# Patient Record
Sex: Female | Born: 1970
Health system: Southern US, Community
[De-identification: ages and names within clinical notes are randomized; demographics above are authoritative.]

## PROBLEM LIST (undated history)

## (undated) DIAGNOSIS — J302 Other seasonal allergic rhinitis: Secondary | ICD-10-CM

## (undated) DIAGNOSIS — I10 Essential (primary) hypertension: Secondary | ICD-10-CM

## (undated) DIAGNOSIS — G43909 Migraine, unspecified, not intractable, without status migrainosus: Secondary | ICD-10-CM

## (undated) DIAGNOSIS — C55 Malignant neoplasm of uterus, part unspecified: Secondary | ICD-10-CM

## (undated) HISTORY — DX: Malignant neoplasm of uterus, part unspecified: C55

## (undated) HISTORY — PX: DILATION AND CURETTAGE OF UTERUS: SHX78

## (undated) HISTORY — PX: NASAL SEPTUM SURGERY: SHX37

## (undated) HISTORY — DX: Essential (primary) hypertension: I10

## (undated) HISTORY — DX: Migraine, unspecified, not intractable, without status migrainosus: G43.909

## (undated) HISTORY — DX: Other seasonal allergic rhinitis: J30.2

---

## 1998-02-01 ENCOUNTER — Ambulatory Visit (HOSPITAL_COMMUNITY): Admission: RE | Admit: 1998-02-01 | Discharge: 1998-02-01 | Payer: Self-pay | Admitting: Gynecology

## 1999-08-08 ENCOUNTER — Encounter: Admission: RE | Admit: 1999-08-08 | Discharge: 1999-11-06 | Payer: Self-pay | Admitting: Gynecology

## 1999-11-13 ENCOUNTER — Other Ambulatory Visit: Admission: RE | Admit: 1999-11-13 | Discharge: 1999-11-13 | Payer: Self-pay | Admitting: Gynecology

## 2000-01-14 ENCOUNTER — Other Ambulatory Visit: Admission: RE | Admit: 2000-01-14 | Discharge: 2000-01-14 | Payer: Self-pay | Admitting: Gynecology

## 2000-11-13 ENCOUNTER — Other Ambulatory Visit: Admission: RE | Admit: 2000-11-13 | Discharge: 2000-11-13 | Payer: Self-pay | Admitting: Gynecology

## 2001-12-14 ENCOUNTER — Other Ambulatory Visit: Admission: RE | Admit: 2001-12-14 | Discharge: 2001-12-14 | Payer: Self-pay

## 2006-02-10 ENCOUNTER — Other Ambulatory Visit: Admission: RE | Admit: 2006-02-10 | Discharge: 2006-02-10 | Payer: Self-pay | Admitting: Obstetrics and Gynecology

## 2006-09-22 ENCOUNTER — Encounter
Admission: RE | Admit: 2006-09-22 | Discharge: 2006-12-21 | Payer: Self-pay | Admitting: Certified Clinical Nurse Specialist

## 2006-12-25 ENCOUNTER — Encounter
Admission: RE | Admit: 2006-12-25 | Discharge: 2007-03-25 | Payer: Self-pay | Admitting: Certified Clinical Nurse Specialist

## 2007-11-24 ENCOUNTER — Ambulatory Visit: Payer: Self-pay | Admitting: Gastroenterology

## 2007-12-07 ENCOUNTER — Ambulatory Visit: Payer: Self-pay | Admitting: Gastroenterology

## 2007-12-25 ENCOUNTER — Ambulatory Visit: Payer: Self-pay | Admitting: Gastroenterology

## 2008-01-08 ENCOUNTER — Ambulatory Visit: Payer: Self-pay | Admitting: Gastroenterology

## 2008-01-21 ENCOUNTER — Ambulatory Visit: Payer: Self-pay | Admitting: Gastroenterology

## 2008-02-18 ENCOUNTER — Ambulatory Visit: Payer: Self-pay | Admitting: Gastroenterology

## 2008-03-17 ENCOUNTER — Ambulatory Visit: Payer: Self-pay | Admitting: Gastroenterology

## 2008-05-04 ENCOUNTER — Ambulatory Visit: Payer: Self-pay | Admitting: Gastroenterology

## 2008-06-29 ENCOUNTER — Ambulatory Visit: Payer: Self-pay | Admitting: Gastroenterology

## 2008-09-15 ENCOUNTER — Ambulatory Visit: Payer: Self-pay | Admitting: Gastroenterology

## 2008-12-15 ENCOUNTER — Ambulatory Visit: Payer: Self-pay | Admitting: Gastroenterology

## 2009-03-15 ENCOUNTER — Ambulatory Visit: Payer: Self-pay | Admitting: Internal Medicine

## 2009-05-16 ENCOUNTER — Ambulatory Visit: Payer: Self-pay | Admitting: Internal Medicine

## 2012-11-16 ENCOUNTER — Ambulatory Visit (INDEPENDENT_AMBULATORY_CARE_PROVIDER_SITE_OTHER): Payer: Commercial Indemnity | Admitting: Physician Assistant

## 2012-11-16 ENCOUNTER — Encounter: Payer: Self-pay | Admitting: Physician Assistant

## 2012-11-16 VITALS — BP 135/87 | HR 95 | Resp 16 | Ht 60.0 in | Wt 179.0 lb

## 2012-11-16 DIAGNOSIS — G43909 Migraine, unspecified, not intractable, without status migrainosus: Secondary | ICD-10-CM | POA: Insufficient documentation

## 2012-11-16 DIAGNOSIS — Z131 Encounter for screening for diabetes mellitus: Secondary | ICD-10-CM

## 2012-11-16 DIAGNOSIS — Z13220 Encounter for screening for lipoid disorders: Secondary | ICD-10-CM

## 2012-11-16 DIAGNOSIS — J302 Other seasonal allergic rhinitis: Secondary | ICD-10-CM | POA: Insufficient documentation

## 2012-11-16 DIAGNOSIS — I10 Essential (primary) hypertension: Secondary | ICD-10-CM

## 2012-11-16 DIAGNOSIS — J45909 Unspecified asthma, uncomplicated: Secondary | ICD-10-CM

## 2012-11-16 LAB — PR PEAK EXPIRATORY FLOW RATE (P

## 2012-11-16 MED ORDER — BUDESONIDE-FORMOTEROL FUMARATE 160-4.5 MCG/ACT IN AERO: 1.0000 | INHALATION_SPRAY | Freq: Two times a day (BID) | RESPIRATORY_TRACT | Status: DC

## 2012-11-16 MED ORDER — ALBUTEROL SULFATE HFA 108 (90 BASE) MCG/ACT IN AERS: 2.0000 | INHALATION_SPRAY | Freq: Four times a day (QID) | RESPIRATORY_TRACT | Status: DC | PRN

## 2012-11-16 MED ORDER — LOSARTAN POTASSIUM-HCTZ 50-12.5 MG PO TABS: 1.0000 | ORAL_TABLET | Freq: Every day | ORAL | Status: DC

## 2012-11-16 NOTE — Patient Instructions (Addendum)
Stop Diovan start Hyzaar. Will call Monday with BP reading and see if any adjustment needs to be made.   1.5 Gram Low Sodium Diet A 1.5 gram sodium diet restricts the amount of sodium in the diet to no more than 1.5 g or 1500 mg daily. The American Heart Association recommends Americans over the age of 59 to consume no more than 1500 mg of sodium each day to reduce the risk of developing high blood pressure. Research also shows that limiting sodium may reduce heart attack and stroke risk. Many foods contain sodium for flavor and sometimes as a preservative. When the amount of sodium in a diet needs to be low, it is important to know what to look for when choosing foods and drinks. The following includes some information and guidelines to help make it easier for you to adapt to a low sodium diet. QUICK TIPS  Do not add salt to food.  Avoid convenience items and fast food.  Choose unsalted snack foods.  Buy lower sodium products, often labeled as "lower sodium" or "no salt added."  Check food labels to learn how much sodium is in 1 serving.  When eating at a restaurant, ask that your food be prepared with less salt or none, if possible. READING FOOD LABELS FOR SODIUM INFORMATION The nutrition facts label is a good place to find how much sodium is in foods. Look for products with no more than 400 mg of sodium per serving. Remember that 1.5 g = 1500 mg. The food label may also list foods as:  Sodium-free: Less than 5 mg in a serving.  Very low sodium: 35 mg or less in a serving.  Low-sodium: 140 mg or less in a serving.  Light in sodium: 50% less sodium in a serving. For example, if a food that usually has 300 mg of sodium is changed to become light in sodium, it will have 150 mg of sodium.  Reduced sodium: 25% less sodium in a serving. For example, if a food that usually has 400 mg of sodium is changed to reduced sodium, it will have 300 mg of sodium. CHOOSING FOODS Grains  Avoid:  Salted crackers and snack items. Some cereals, including instant hot cereals. Bread stuffing and biscuit mixes. Seasoned rice or pasta mixes.  Choose: Unsalted snack items. Low-sodium cereals, oats, puffed wheat and rice, shredded wheat. English muffins and bread. Pasta. Meats  Avoid: Salted, canned, smoked, spiced, pickled meats, including fish and poultry. Bacon, ham, sausage, cold cuts, hot dogs, anchovies.  Choose: Low-sodium canned tuna and salmon. Fresh or frozen meat, poultry, and fish. Dairy  Avoid: Processed cheese and spreads. Cottage cheese. Buttermilk and condensed milk. Regular cheese.  Choose: Milk. Low-sodium cottage cheese. Yogurt. Sour cream. Low-sodium cheese. Fruits and Vegetables  Avoid: Regular canned vegetables. Regular canned tomato sauce and paste. Frozen vegetables in sauces. Olives. Rosita Fire. Relishes. Sauerkraut.  Choose: Low-sodium canned vegetables. Low-sodium tomato sauce and paste. Frozen or fresh vegetables. Fresh and frozen fruit. Condiments  Avoid: Canned and packaged gravies. Worcestershire sauce. Tartar sauce. Barbecue sauce. Soy sauce. Steak sauce. Ketchup. Onion, garlic, and table salt. Meat flavorings and tenderizers.  Choose: Fresh and dried herbs and spices. Low-sodium varieties of mustard and ketchup. Lemon juice. Tabasco sauce. Horseradish. SAMPLE 1.5 GRAM SODIUM MEAL PLAN Breakfast / Sodium (mg)  1 cup low-fat milk / 143 mg  1 whole-wheat English muffin / 240 mg  1 tbs heart-healthy margarine / 153 mg  1 hard-boiled egg / 139 mg  1 small orange / 0 mg Lunch / Sodium (mg)  1 cup raw carrots / 76 mg  2 tbs no salt added peanut butter / 5 mg  2 slices whole-wheat bread / 270 mg  1 tbs jelly / 6 mg   cup red grapes / 2 mg Dinner / Sodium (mg)  1 cup whole-wheat pasta / 2 mg  1 cup low-sodium tomato sauce / 73 mg  3 oz lean ground beef / 57 mg  1 small side salad (1 cup raw spinach leaves,  cup cucumber,  cup yellow bell  pepper) with 1 tsp olive oil and 1 tsp red wine vinegar / 25 mg Snack / Sodium (mg)  1 container low-fat vanilla yogurt / 107 mg  3 graham cracker squares / 127 mg Nutrient Analysis  Calories: 1745  Protein: 75 g  Carbohydrate: 237 g  Fat: 57 g  Sodium: 1425 mg Document Released: 11/18/2005 Document Revised: 02/10/2012 Document Reviewed: 02/19/2010 [facility] Patient Information 2013 [place], [place].

## 2012-11-17 NOTE — Progress Notes (Addendum)
  Subjective:    Patient ID: Brittany Byrd, female    DOB: 1971-07-18, 41 y.o.   MRN: [id number]  HPI Patient presents to the clinic to establish care and to talk about medications. Pt is right now leaving in the [place] for work. She is supposed to move back up here in the summer. She wanted to go ahead and get established and ask some question so she would be ready to go when she moved here.   She has recently had some lung problems that Asthma doctor in [place] do not think is asthma but put her on daily symbicort, benadryl and prn albuterol inhaler. She is doing much better on this therapy. She had allergy testing and tested positive for a mold where she lives and work. Aware this is an ongoing problem. Pt is also on allergy shots. She does not smoke.  She also was put on Divovan. It was very expensive and the first medication she tried. Wants to try something cheaper for BP control. She has had elevated BP for about 1 year. BP has been as high as 160's of 90's. Denies any current CP, palpitations, HAs. Pt is trying to eat balanced and exercise regularly. She has been really busy and not been able to exercise as much lately.   Needs screening labs. Scheduled for pap/mammogram this week.      Review of Systems     Objective:   Physical Exam  Constitutional: She is oriented to person, place, and time. She appears well-developed and well-nourished.  HENT:  Head: Normocephalic and atraumatic.  Cardiovascular: Normal rate and regular rhythm.   Murmur heard.      2/6 systolic ejection murmur.  Pulmonary/Chest: Effort normal and breath sounds normal. She has no wheezes.  Neurological: She is alert and oriented to person, place, and time.  Skin: Skin is warm and dry.  Psychiatric: She has a normal mood and affect. Her behavior is normal.          Assessment & Plan:  Asthma, allergic- peak flow today 420 and in green zone. Reassured patient that treatment is working. Continue on inhalers,  allergy shots, and benadryl. Could try to replace benadryl with Zyrtec/claritin.  HTN- Stop Diovan.  start Hyzaar. Will call Monday with BP reading and see if any adjustment needs to be made. If not will send rx for 3 month supply. After that will have to get in [place]. See ya when you get back in 6 months. Reminded of low salt diet and importance of exercise.  Gave lab slip for screening labs. Declined flu shot does not think will be as effective as one she could get in [place].

## 2012-11-18 DIAGNOSIS — J45909 Unspecified asthma, uncomplicated: Secondary | ICD-10-CM | POA: Insufficient documentation

## 2012-11-20 ENCOUNTER — Ambulatory Visit: Payer: Self-pay | Admitting: Physician Assistant

## 2012-11-20 LAB — COMPREHENSIVE METABOLIC PANEL
ALT: <8 U/L (ref 0–35)
AST: 12 U/L (ref 0–37)
Alkaline Phosphatase: 47 U/L (ref 39–117)
Calcium: 9.3 mg/dL (ref 8.4–10.5)
Chloride: 103 mEq/L (ref 96–112)
Creat: 0.79 mg/dL (ref 0.50–1.10)
Potassium: 4 mEq/L (ref 3.5–5.3)

## 2012-11-20 LAB — LIPID PANEL
LDL Cholesterol: 119 mg/dL — ABNORMAL HIGH (ref 0–99)
Total CHOL/HDL Ratio: 4.1 Ratio

## 2012-11-26 ENCOUNTER — Telehealth: Payer: Self-pay | Admitting: *Deleted

## 2012-11-26 NOTE — Telephone Encounter (Signed)
Pt called stating that her BP readings were 134/90 and 128/85 and she is asking for a 90 day supply on the BP med since she is going out of the country. Please advise.

## 2012-11-27 MED ORDER — LOSARTAN POTASSIUM-HCTZ 50-12.5 MG PO TABS: 1.0000 | ORAL_TABLET | Freq: Every day | ORAL | Status: DC

## 2012-11-27 NOTE — Telephone Encounter (Signed)
Please let pt know medication was sent to pharmacy.

## 2012-11-27 NOTE — Telephone Encounter (Signed)
Sent!

## 2013-02-02 ENCOUNTER — Other Ambulatory Visit: Payer: Self-pay | Admitting: Physician Assistant

## 2013-03-31 ENCOUNTER — Encounter: Payer: Self-pay | Admitting: Physician Assistant

## 2013-03-31 ENCOUNTER — Ambulatory Visit (INDEPENDENT_AMBULATORY_CARE_PROVIDER_SITE_OTHER): Payer: Commercial Indemnity | Admitting: Physician Assistant

## 2013-03-31 VITALS — BP 128/83 | HR 85 | Wt 178.0 lb

## 2013-03-31 DIAGNOSIS — I10 Essential (primary) hypertension: Secondary | ICD-10-CM

## 2013-03-31 DIAGNOSIS — E669 Obesity, unspecified: Secondary | ICD-10-CM

## 2013-03-31 MED ORDER — HYDROCHLOROTHIAZIDE 25 MG PO TABS: 25.0000 mg | ORAL_TABLET | Freq: Every day | ORAL | Status: DC

## 2013-03-31 MED ORDER — LOSARTAN POTASSIUM-HCTZ 50-12.5 MG PO TABS: 1.0000 | ORAL_TABLET | Freq: Every day | ORAL | Status: DC

## 2013-03-31 NOTE — Progress Notes (Signed)
  Subjective:    Patient ID: Brittany Byrd, female    DOB: November 08, 1971, 42 y.o.   MRN: [id number]  HPI Patient presents to the clinic to follow up on HTN.   HTN is takes hyzaar daily with no complications. She denies any CP, palpitations, SOB, worsening headaches. She occasionally gets migraines that she take treximet and phenergan for. She is exercising 4-5 times a week and has a Systems analyst. She has a Systems analyst. Calories around 1400-1600 daily. She has lost 35 lbs over the course of last 2 years. She feels really good and has lots of energy. She would like to decrease BP medication. She states when she checks at home running low 100's. She denies any dizziness. She continues to live in [place], out of the country. She comes back July 4th [place] for [place].    Review of Systems     Objective:   Physical Exam  Constitutional: She is oriented to person, place, and time. She appears well-developed and well-nourished.  HENT:  Head: Normocephalic and atraumatic.  Cardiovascular: Normal rate, regular rhythm and normal heart sounds.   Pulmonary/Chest: Effort normal and breath sounds normal.  Neurological: She is alert and oriented to person, place, and time.  Skin: Skin is warm and dry.  Psychiatric: She has a normal mood and affect. Her behavior is normal.          Assessment & Plan:  HTN- consider switching to diuretic only. Gave rx and then we change our mind. Prescription was voided. Refilled hyzaar patient instructed if she would like to see if BP is still controlled with decrease to half tab. Continue to monitor BP. If staying in 120's awesome if starting to increase and around 140's then need to go back to 1 full tab. Follow up when come back to states. 2-3 months.   Obesity- Discussion of things to do for more weight loss. Pt aware BMI is 34. Goal under 25. Discussed portion sizes and to switch it up with exercise. Advised to decrease daily calories by 200 and  see if that helps to get daily calories around 1200. Can consider phentermine or belviq when [place] and can be monitored.   Spent 30 minutes with patient and greater than 50 percent of encounter spent counseling regarding calories and weight loss.

## 2013-07-23 ENCOUNTER — Encounter: Payer: Self-pay | Admitting: Physician Assistant

## 2013-08-18 ENCOUNTER — Ambulatory Visit: Payer: Self-pay

## 2013-09-09 ENCOUNTER — Other Ambulatory Visit: Payer: Self-pay | Admitting: *Deleted

## 2013-09-09 MED ORDER — LOSARTAN POTASSIUM-HCTZ 50-12.5 MG PO TABS: 1.0000 | ORAL_TABLET | Freq: Every day | ORAL | Status: DC

## 2013-10-15 ENCOUNTER — Ambulatory Visit (INDEPENDENT_AMBULATORY_CARE_PROVIDER_SITE_OTHER): Payer: 59 | Admitting: Physician Assistant

## 2013-10-15 ENCOUNTER — Encounter: Payer: Self-pay | Admitting: Physician Assistant

## 2013-10-15 VITALS — BP 116/75 | HR 87 | Wt 194.0 lb

## 2013-10-15 DIAGNOSIS — R635 Abnormal weight gain: Secondary | ICD-10-CM

## 2013-10-15 DIAGNOSIS — G4700 Insomnia, unspecified: Secondary | ICD-10-CM

## 2013-10-15 DIAGNOSIS — I10 Essential (primary) hypertension: Secondary | ICD-10-CM

## 2013-10-15 MED ORDER — LOSARTAN POTASSIUM-HCTZ 50-12.5 MG PO TABS: 1.0000 | ORAL_TABLET | Freq: Every day | ORAL | Status: DC

## 2013-10-15 NOTE — Patient Instructions (Addendum)
Melatonin 3mg  up to 10mg  at night. Valerian root 300mg .   Calorie Counting Diet A calorie counting diet requires you to eat the number of calories that are right for you in a day. Calories are the measurement of how much energy you get from the food you eat. Eating the right amount of calories is important for staying at a healthy weight. If you eat too many calories, your body will store them as fat and you may gain weight. If you eat too few calories, you may lose weight. Counting the number of calories you eat during a day will help you know if you are eating the right amount. A Registered Dietitian can determine how many calories you need in a day. The amount of calories needed varies from person to person. If your goal is to lose weight, you will need to eat fewer calories. Losing weight can benefit you if you are overweight or have health problems such as heart disease, high blood pressure, or diabetes. If your goal is to gain weight, you will need to eat more calories. Gaining weight may be necessary if you have a certain health problem that causes your body to need more energy. TIPS Whether you are increasing or decreasing the number of calories you eat during a day, it may be hard to get used to changes in what you eat and drink. The following are tips to help you keep track of the number of calories you eat.  Measure foods at home with measuring cups. This helps you know the amount of food and number of calories you are eating.  Restaurants often serve food in amounts that are larger than 1 serving. While eating out, estimate how many servings of a food you are given. For example, a serving of cooked rice is  cup or about the size of half of a fist. Knowing serving sizes will help you be aware of how much food you are eating at restaurants.  Ask for smaller portion sizes or child-size portions at restaurants.  Plan to eat half of a meal at a restaurant. Take the rest home or share the other  half with a friend.  Read the Nutrition Facts panel on food labels for calorie content and serving size. You can find out how many servings are in a package, the size of a serving, and the number of calories each serving has.  For example, a package might contain 3 cookies. The Nutrition Facts panel on that package says that 1 serving is 1 cookie. Below that, it will say there are 3 servings in the container. The calories section of the Nutrition Facts label says there are 90 calories. This means there are 90 calories in 1 cookie (1 serving). If you eat 1 cookie you have eaten 90 calories. If you eat all 3 cookies, you have eaten 270 calories (3 servings x 90 calories = 270 calories). The list below tells you how big or small some common portion sizes are.  1 oz.........4 stacked dice.  3 oz........[name] KitchenDeck of cards.  1 tsp.......[name] KitchenTip of little finger.  1 tbs......[name] KitchenMarland KitchenThumb.  2 tbs.......[name] KitchenGolf ball.   cup......[name] KitchenHalf of a fist.  1 cup.......[name] KitchenA fist. KEEP A FOOD LOG Write down every food item you eat, the amount you eat, and the number of calories in each food you eat during the day. At the end of the day, you can add up the total number of calories you have eaten. It may help to keep a list  like the one below. Find out the calorie information by reading the Nutrition Facts panel on food labels. Breakfast  Bran cereal (1 cup, 110 calories).  Fat-free milk ( cup, 45 calories). Snack  Apple (1 medium, 80 calories). Lunch  Spinach (1 cup, 20 calories).  Tomato ( medium, 20 calories).  Chicken breast strips (3 oz, 165 calories).  Shredded cheddar cheese ( cup, 110 calories).  Light [place] dressing (2 tbs, 60 calories).  Whole-wheat bread (1 slice, 80 calories).  Tub margarine (1 tsp, 35 calories).  Vegetable soup (1 cup, 160 calories). Dinner  Pork chop (3 oz, 190 calories).  Brown rice (1 cup, 215 calories).  Steamed broccoli ( cup, 20 calories).  Strawberries (1   cup, 65 calories).  Whipped cream (1 tbs, 50 calories). Daily Calorie Total: 1425 Document Released: 11/18/2005 Document Revised: 02/10/2012 Document Reviewed: 05/15/2007 [facility] Patient Information 2014 [place], [place].   Insomnia Insomnia is frequent trouble falling and/or staying asleep. Insomnia can be a long term problem or a short term problem. Both are common. Insomnia can be a short term problem when the wakefulness is related to a certain stress or worry. Long term insomnia is often related to ongoing stress during waking hours and/or poor sleeping habits. Overtime, sleep deprivation itself can make the problem worse. Every little thing feels more severe because you are overtired and your ability to cope is decreased. CAUSES   Stress, anxiety, and depression.  Poor sleeping habits.  Distractions such as TV in the bedroom.  Naps close to bedtime.  Engaging in emotionally charged conversations before bed.  Technical reading before sleep.  Alcohol and other sedatives. They may make the problem worse. They can hurt normal sleep patterns and normal dream activity.  Stimulants such as caffeine for several hours prior to bedtime.  Pain syndromes and shortness of breath can cause insomnia.  Exercise late at night.  Changing time zones may cause sleeping problems (jet lag). It is sometimes helpful to have someone observe your sleeping patterns. They should look for periods of not breathing during the night (sleep apnea). They should also look to see how long those periods last. If you live alone or observers are uncertain, you can also be observed at a sleep clinic where your sleep patterns will be professionally monitored. Sleep apnea requires a checkup and treatment. Give your caregivers your medical history. Give your caregivers observations your family has made about your sleep.  SYMPTOMS   Not feeling rested in the morning.  Anxiety and restlessness at  bedtime.  Difficulty falling and staying asleep. TREATMENT   Your caregiver may prescribe treatment for an underlying medical disorders. Your caregiver can give advice or help if you are using alcohol or other drugs for self-medication. Treatment of underlying problems will usually eliminate insomnia problems.  Medications can be prescribed for short time use. They are generally not recommended for lengthy use.  Over-the-counter sleep medicines are not recommended for lengthy use. They can be habit forming.  You can promote easier sleeping by making lifestyle changes such as:  Using relaxation techniques that help with breathing and reduce muscle tension.  Exercising earlier in the day.  Changing your diet and the time of your last meal. No night time snacks.  Establish a regular time to go to bed.  Counseling can help with stressful problems and worry.  Soothing music and white noise may be helpful if there are background noises you cannot remove.  Stop tedious detailed work at least one  hour before bedtime. HOME CARE INSTRUCTIONS   Keep a diary. Inform your caregiver about your progress. This includes any medication side effects. See your caregiver regularly. Take note of:  Times when you are asleep.  Times when you are awake during the night.  The quality of your sleep.  How you feel the next day. This information will help your caregiver care for you.  Get out of bed if you are still awake after 15 minutes. Read or do some quiet activity. Keep the lights down. Wait until you feel sleepy and go back to bed.  Keep regular sleeping and waking hours. Avoid naps.  Exercise regularly.  Avoid distractions at bedtime. Distractions include watching television or engaging in any intense or detailed activity like attempting to balance the household checkbook.  Develop a bedtime ritual. Keep a familiar routine of bathing, brushing your teeth, climbing into bed at the same time  each night, listening to soothing music. Routines increase the success of falling to sleep faster.  Use relaxation techniques. This can be using breathing and muscle tension release routines. It can also include visualizing peaceful scenes. You can also help control troubling or intruding thoughts by keeping your mind occupied with boring or repetitive thoughts like the old concept of counting sheep. You can make it more creative like imagining planting one beautiful flower after another in your backyard garden.  During your day, work to eliminate stress. When this is not possible use some of the previous suggestions to help reduce the anxiety that accompanies stressful situations. MAKE SURE YOU:   Understand these instructions.  Will watch your condition.  Will get help right away if you are not doing well or get worse. Document Released: 11/15/2000 Document Revised: 02/10/2012 Document Reviewed: 12/16/2007 [facility] Patient Information 2014 [place], [place].

## 2013-10-17 NOTE — Progress Notes (Signed)
  Subjective:    Patient ID: Brittany Byrd, female    DOB: 05-31-1971, 42 y.o.   MRN: [id number]  HPI Patient presents to the clinic for a medication refill for her HTN medication. Pt has no problems or concerns today. Pt denies any CP, palpitations, vision changes or headaches. Pt takes medication regularly without any side effects.    Review of Systems     Objective:   Physical Exam  Constitutional: She is oriented to person, place, and time. She appears well-developed and well-nourished.  HENT:  Head: Normocephalic and atraumatic.  Cardiovascular: Normal rate, regular rhythm and normal heart sounds.   Pulmonary/Chest: Effort normal and breath sounds normal.  Neurological: She is alert and oriented to person, place, and time.  Skin: Skin is warm and dry.  Psychiatric: She has a normal mood and affect. Her behavior is normal.          Assessment & Plan:  HTN- last CMP was 12/13 and was normal. Follow up in 6 months and will recheck then. Discussed with patient a CPE in near future. Refilled hyzaar for 6 months.

## 2013-11-22 ENCOUNTER — Other Ambulatory Visit: Payer: Self-pay | Admitting: *Deleted

## 2013-11-22 MED ORDER — LOSARTAN POTASSIUM-HCTZ 50-12.5 MG PO TABS: 1.0000 | ORAL_TABLET | Freq: Every day | ORAL | Status: DC

## 2013-12-24 ENCOUNTER — Encounter: Payer: Self-pay | Admitting: Physician Assistant

## 2013-12-24 ENCOUNTER — Ambulatory Visit (INDEPENDENT_AMBULATORY_CARE_PROVIDER_SITE_OTHER): Payer: 59 | Admitting: Physician Assistant

## 2013-12-24 VITALS — BP 114/62 | HR 87 | Temp 97.7°F | Wt 195.0 lb

## 2013-12-24 DIAGNOSIS — J329 Chronic sinusitis, unspecified: Secondary | ICD-10-CM

## 2013-12-24 DIAGNOSIS — R05 Cough: Secondary | ICD-10-CM

## 2013-12-24 DIAGNOSIS — A499 Bacterial infection, unspecified: Secondary | ICD-10-CM

## 2013-12-24 DIAGNOSIS — R059 Cough, unspecified: Secondary | ICD-10-CM

## 2013-12-24 DIAGNOSIS — B9689 Other specified bacterial agents as the cause of diseases classified elsewhere: Secondary | ICD-10-CM

## 2013-12-24 MED ORDER — HYDROCODONE-HOMATROPINE 5-1.5 MG/5ML PO SYRP: 5.0000 mL | ORAL_SOLUTION | Freq: Every evening | ORAL | Status: DC | PRN

## 2013-12-24 MED ORDER — AMOXICILLIN-POT CLAVULANATE 875-125 MG PO TABS: 1.0000 | ORAL_TABLET | Freq: Two times a day (BID) | ORAL | Status: DC

## 2013-12-24 MED ORDER — BENZONATATE 200 MG PO CAPS: 200.0000 mg | ORAL_CAPSULE | Freq: Three times a day (TID) | ORAL | Status: DC | PRN

## 2013-12-24 NOTE — Patient Instructions (Signed)

## 2013-12-24 NOTE — Progress Notes (Signed)
   Subjective:    Patient ID: Brittany Byrd, female    DOB: 1971-10-21, 43 y.o.   MRN: [id number]  HPI patient presents to the clinic with chief complaint of cough and sinus pressure. Patient has experienced symptoms for the last 5 days. Last night and this morning she woke up with a low-grade fever of 99-100. Her cough is dry. She continues to cough throughout the day. She's had multiple episodes where she almost felt short of breath from coughing so much. She has significant pain under bilateral arms and around chest from coughing. She has tried Advil, Mucinex, NyQuil and Vicks vapor rub. Yesterday she started having significant facial pain and headache. She has a mild sore throat still able to eat and drink. She denies any ear pain. She does have some bony aches. No wheezing or SOb.     Review of Systems     Objective:   Physical Exam  Constitutional: She is oriented to person, place, and time. She appears well-developed and well-nourished.  HENT:  Head: Normocephalic and atraumatic.  Right Ear: External ear normal.  Left Ear: External ear normal.  TMs clear bilaterally.  Oropharynx erythematous with postnasal drip present.  Bilateral nasal turbinates red and swollen.  Eyes: Conjunctivae are normal.  Neck: Normal range of motion. Neck supple.  Cardiovascular: Normal rate, regular rhythm and normal heart sounds.   Pulmonary/Chest: Effort normal and breath sounds normal. She has no wheezes.  Lymphadenopathy:    She has no cervical adenopathy.  Neurological: She is alert and oriented to person, place, and time.  Skin: Skin is warm and dry.  Psychiatric: She has a normal mood and affect. Her behavior is normal.          Assessment & Plan:   Bacterial sinusitis/cough-Augmentin given for 10 days. Tessalon pearles for cough during day. Hycodan for cough at bedtime. Rest, hydrated. Call if not improving.

## 2014-01-17 ENCOUNTER — Encounter: Payer: Self-pay | Admitting: Physician Assistant

## 2014-01-17 ENCOUNTER — Ambulatory Visit (INDEPENDENT_AMBULATORY_CARE_PROVIDER_SITE_OTHER): Payer: 59 | Admitting: Physician Assistant

## 2014-01-17 VITALS — BP 116/66 | HR 100 | Temp 100.5°F | Wt 191.0 lb

## 2014-01-17 DIAGNOSIS — R509 Fever, unspecified: Secondary | ICD-10-CM

## 2014-01-17 DIAGNOSIS — R6883 Chills (without fever): Secondary | ICD-10-CM

## 2014-01-17 DIAGNOSIS — R51 Headache: Secondary | ICD-10-CM

## 2014-01-17 DIAGNOSIS — R110 Nausea: Secondary | ICD-10-CM

## 2014-01-17 DIAGNOSIS — J029 Acute pharyngitis, unspecified: Secondary | ICD-10-CM

## 2014-01-17 DIAGNOSIS — J019 Acute sinusitis, unspecified: Secondary | ICD-10-CM

## 2014-01-17 LAB — POCT INFLUENZA A/B
INFLUENZA B, POC: NEGATIVE
Influenza A, POC: NEGATIVE

## 2014-01-17 MED ORDER — METHYLPREDNISOLONE (PAK) 4 MG PO TABS
ORAL_TABLET | ORAL | Status: DC
Start: 2014-01-17 — End: 2014-01-28

## 2014-01-17 MED ORDER — HYDROCODONE-HOMATROPINE 5-1.5 MG/5ML PO SYRP: 5.0000 mL | ORAL_SOLUTION | Freq: Every evening | ORAL | Status: DC | PRN

## 2014-01-17 MED ORDER — AMOXICILLIN 500 MG PO CAPS: 500.0000 mg | ORAL_CAPSULE | Freq: Three times a day (TID) | ORAL | Status: DC

## 2014-01-17 NOTE — Progress Notes (Signed)
   Subjective:    Patient ID: Brittany Byrd, female    DOB: 1971/07/01, 43 y.o.   MRN: [id number]  HPI Pt is a 43 yo female with 4 days of symptoms. Started with dry cough, headache and congestion. Symptoms continue to get worse. She has had a lot of body aches. She denies any wheezing or SOB. She has had a fever on and off. No sick contacts. No nausea and vomiting. Extreme head pressure. Facial pain bilaterally. Using left cough syrup given by [place], mucinex d and zyrtec with no relief.   Review of Systems     Objective:   Physical Exam  Constitutional: She is oriented to person, place, and time. She appears well-developed and well-nourished.  HENT:  Head: Normocephalic and atraumatic.  Right Ear: External ear normal.  Left Ear: External ear normal.  Nose: Nose normal.  Mouth/Throat: Oropharynx is clear and moist.  TM's clear bilaterally.   Left and right maxillary facial pain to palpation.   Eyes: Conjunctivae are normal. Right eye exhibits no discharge. Left eye exhibits no discharge.  Neck: Normal range of motion. Neck supple.  Cardiovascular: Normal rate, regular rhythm and normal heart sounds.   Pulmonary/Chest: Effort normal and breath sounds normal. She has no wheezes.  Lymphadenopathy:    She has no cervical adenopathy.  Neurological: She is alert and oriented to person, place, and time.  Skin: Skin is warm and dry.  Flushed cheeks.   Psychiatric: She has a normal mood and affect. Her behavior is normal.          Assessment & Plan:  Sinusitis/flu like symptoms- influenza was negative. However I am not convinced that she might have waited to long to get tested and now negative. She certainly has symptoms suggestive of sinusitis for last 4 days. I encouraged pt to start medrol dose pack with other symptomatic treatment. If not improving in 24-48 hours gave amoxil to start. HO given for sinusitis. Work note for today and tomorrow. Continue ibuprofen for fever and pains.

## 2014-01-17 NOTE — Patient Instructions (Signed)

## 2014-01-20 ENCOUNTER — Encounter: Payer: Self-pay | Admitting: Emergency Medicine

## 2014-01-20 ENCOUNTER — Emergency Department (INDEPENDENT_AMBULATORY_CARE_PROVIDER_SITE_OTHER)
Admission: EM | Admit: 2014-01-20 | Discharge: 2014-01-20 | Disposition: A | Discharge status: Home / Self Care | Payer: 59 | Source: Home / Self Care | Attending: Emergency Medicine | Admitting: Emergency Medicine

## 2014-01-20 ENCOUNTER — Emergency Department (INDEPENDENT_AMBULATORY_CARE_PROVIDER_SITE_OTHER): Payer: 59

## 2014-01-20 DIAGNOSIS — R05 Cough: Secondary | ICD-10-CM

## 2014-01-20 DIAGNOSIS — R059 Cough, unspecified: Secondary | ICD-10-CM

## 2014-01-20 DIAGNOSIS — R509 Fever, unspecified: Secondary | ICD-10-CM

## 2014-01-20 DIAGNOSIS — J45909 Unspecified asthma, uncomplicated: Secondary | ICD-10-CM

## 2014-01-20 DIAGNOSIS — R062 Wheezing: Secondary | ICD-10-CM

## 2014-01-20 DIAGNOSIS — J209 Acute bronchitis, unspecified: Secondary | ICD-10-CM

## 2014-01-20 MED ORDER — IPRATROPIUM-ALBUTEROL 0.5-2.5 (3) MG/3ML IN SOLN
3.0000 mL | Freq: Once | RESPIRATORY_TRACT | Status: AC
  Administered 2014-01-20: 3 mL via RESPIRATORY_TRACT

## 2014-01-20 MED ORDER — AZITHROMYCIN 250 MG PO TABS: ORAL_TABLET | ORAL | Status: DC

## 2014-01-20 MED ORDER — FLUCONAZOLE 150 MG PO TABS: 150.0000 mg | ORAL_TABLET | Freq: Once | ORAL | Status: DC

## 2014-01-20 MED ORDER — METHYLPREDNISOLONE SODIUM SUCC 125 MG IJ SOLR
125.0000 mg | INTRAMUSCULAR | Status: AC
  Administered 2014-01-20: 125 mg via INTRAMUSCULAR

## 2014-01-20 MED ORDER — CEFTRIAXONE SODIUM 1 G IJ SOLR
1.0000 g | INTRAMUSCULAR | Status: AC
  Administered 2014-01-20: 1 g via INTRAMUSCULAR

## 2014-01-20 NOTE — ED Notes (Signed)
Cough, chills, fever, wheezing. Was seen on Monday, put on prednisone, abx, cough syrup, mucinex but feels worse

## 2014-01-20 NOTE — ED Provider Notes (Signed)
CSN: [id number]     Arrival date & time 01/20/14  7829 History   First MD Initiated Contact with Patient 01/20/14 1005     Chief Complaint  Patient presents with  . Cough     (Consider location/radiation/quality/duration/timing/severity/associated sxs/prior Treatment) HPI URI HISTORY  Brittany Byrd is a 43 y.o. female who complains of onset of cough and cold symptoms for over one week.  Cough, chills, fever, wheezing. Was seen by PCP 3 days ago, put on Medrol Dosepak, amoxicillin, cough syrup, mucinex, albuterol HFA, but feels worse  History of wheezing with URIs in the past.   positive chills/sweats +  Fever  +  Nasal congestion +  Discolored Post-nasal drainage No sinus pain/pressure Minimal sore throat  +  Hacking cough, mostly dry, occasionally productive of discolored sputum. + wheezing + chest congestion No hemoptysis No shortness of breath No pleuritic pain, but has anterior chest irritation and soreness when she coughs.--No exertional chest pain.  No itchy/red eyes No earache  No nausea No vomiting No abdominal pain No diarrhea  No skin rashes +  Fatigue No myalgias No headache   Past Medical History  Diagnosis Date  . Hypertension   . Seasonal allergies   . Migraine    Past Surgical History  Procedure Laterality Date  . Nasal septum surgery    . Dilation and curettage of uterus     Family History  Problem Relation Age of Onset  . Depression Mother   . Hypertension Mother   . Diabetes Father   . Hypertension Father   . Diabetes Maternal Grandmother   . Diabetes Paternal Grandmother    History  Substance Use Topics  . Smoking status: Never Smoker   . Smokeless tobacco: Never Used  . Alcohol Use: Yes     Comment: rare   OB History   Grav Para Term Preterm Abortions TAB SAB Ect Mult Living                 Review of Systems  All other systems reviewed and are negative.      Allergies  Review of patient's allergies indicates not on  file.  Home Medications   Current Outpatient Rx  Name  Route  Sig  Dispense  Refill  . albuterol (PROVENTIL HFA;VENTOLIN HFA) 108 (90 BASE) MCG/ACT inhaler   Inhalation   Inhale 2 puffs into the lungs every 6 (six) hours as needed.   4 Inhaler   0   . amoxicillin (AMOXIL) 500 MG capsule   Oral   Take 1 capsule (500 mg total) by mouth 3 (three) times daily.   30 capsule   0   . aspirin 81 MG tablet   Oral   Take 81 mg by mouth daily.         [name] azithromycin (ZITHROMAX Z-PAK) 250 MG tablet      Take 2 tablets on day one, then 1 tablet daily on days 2 through 5   1 each   0   . diphenhydrAMINE (BENADRYL) 25 MG tablet   Oral   Take 25 mg by mouth every 6 (six) hours as needed.         . drospirenone-ethinyl estradiol (YAZ) 3-0.02 MG tablet   Oral   Take 1 tablet by mouth daily.         . fluconazole (DIFLUCAN) 150 MG tablet   Oral   Take 1 tablet (150 mg total) by mouth once. Take 1 now, then may repeat x1 in  4 days, for yeast infection.   2 tablet   0   . HYDROcodone-homatropine (HYCODAN) 5-1.5 MG/5ML syrup   Oral   Take 5 mLs by mouth at bedtime as needed for cough.   120 mL   0   . losartan-hydrochlorothiazide (HYZAAR) 50-12.5 MG per tablet   Oral   Take 1 tablet by mouth daily.   90 tablet   1   . methylPREDNIsolone (MEDROL DOSPACK) 4 MG tablet      follow package directions   21 tablet   0   . SUMAtriptan-naproxen (TREXIMET) 85-500 MG per tablet   Oral   Take 1 tablet by mouth every 2 (two) hours as needed.          BP 110/78  Pulse 92  Temp(Src) 98.3 F (36.8 C) (Oral)  Ht 5' (1.524 m)  Wt 191 lb (86.637 kg)  BMI 37.30 kg/m2  SpO2 97% Physical Exam  Nursing note and vitals reviewed. Constitutional: She is oriented to person, place, and time. She appears well-developed and well-nourished. No distress.  HENT:  Head: Normocephalic and atraumatic.  Right Ear: Tympanic membrane normal.  Left Ear: Tympanic membrane normal.  Nose:  Mucosal edema and rhinorrhea present. Right sinus exhibits no maxillary sinus tenderness. Left sinus exhibits no maxillary sinus tenderness.  Mouth/Throat: Oropharynx is clear and moist. No oropharyngeal exudate.  Eyes: Right eye exhibits no discharge. Left eye exhibits no discharge. No scleral icterus.  Neck: Neck supple.  Cardiovascular: Normal rate, regular rhythm and normal heart sounds.   Pulmonary/Chest: No respiratory distress. She has decreased breath sounds (Mild, diffuse decreased breath sounds, fair, equal air movement bilaterally). She has wheezes. She has rhonchi. She has no rales.  Lymphadenopathy:    She has no cervical adenopathy.  Neurological: She is alert and oriented to person, place, and time.  Skin: Skin is warm and dry. No rash noted.    ED Course  Procedures (including critical care time) Labs Review Labs Reviewed - No data to display Imaging Review Dg Chest 2 View  01/20/2014   CLINICAL DATA:  Cough and fever  EXAM: CHEST  2 VIEW  COMPARISON:  None.  FINDINGS: Lungs are clear. Heart size and pulmonary vascularity are normal. No adenopathy. No bone lesions.  IMPRESSION: No edema or consolidation.   Electronically Signed   By: Lowella Grip M.D.   On: 01/20/2014 10:29    Chest x-ray and DuoNeb treatment ordered   MDM   Final diagnoses:  Bronchitis, acute, with bronchospasm   after DuoNeb treatment, wheezing and aeration improved. Chest x-ray shows no infiltrates or other abnormalities.--Reviewed with patient Treatment options discussed, as well as risks, benefits, alternatives. Patient voiced understanding and agreement with the following plans: Rocephin 1 g IM stat Depo-Medrol 125 mg IM stat I advised and offered a new prescription for 10-12 days of prednisone, but she declined and she prefers to finish the Medrol Dosepak that she has. Zithromax Z-PAK Prescribe Diflucan at her request in case she gets yeast infection from the antibiotic Follow-up with  your primary care doctor in 5-7 days if not improving, or sooner if symptoms become worse.  Precautions discussed. Red flags discussed. Questions invited and answered. Patient voiced understanding and agreement.     Jacqulyn Cane, MD 01/20/14 2207

## 2014-01-28 ENCOUNTER — Ambulatory Visit (INDEPENDENT_AMBULATORY_CARE_PROVIDER_SITE_OTHER): Payer: 59 | Admitting: Physician Assistant

## 2014-01-28 ENCOUNTER — Encounter: Payer: Self-pay | Admitting: Physician Assistant

## 2014-01-28 VITALS — BP 115/68 | HR 95 | Temp 98.1°F | Wt 187.0 lb

## 2014-01-28 DIAGNOSIS — R05 Cough: Secondary | ICD-10-CM

## 2014-01-28 DIAGNOSIS — J45909 Unspecified asthma, uncomplicated: Secondary | ICD-10-CM

## 2014-01-28 DIAGNOSIS — R059 Cough, unspecified: Secondary | ICD-10-CM

## 2014-01-28 MED ORDER — HYDROCOD POLST-CHLORPHEN POLST 10-8 MG/5ML PO LQCR: 5.0000 mL | Freq: Two times a day (BID) | ORAL | Status: DC | PRN

## 2014-01-28 MED ORDER — METHYLPREDNISOLONE ACETATE 80 MG/ML IJ SUSP
80.0000 mg | Freq: Once | INTRAMUSCULAR | Status: AC
  Administered 2014-01-28: 80 mg via INTRAMUSCULAR

## 2014-01-28 MED ORDER — BENZONATATE 200 MG PO CAPS: 200.0000 mg | ORAL_CAPSULE | Freq: Two times a day (BID) | ORAL | Status: DC | PRN

## 2014-01-28 NOTE — Patient Instructions (Addendum)
Start Qvar 1 puff twice a day.  Cough syrup for at night and during the day.  Continue to use albuterol as needed.  Zyrtec with singulair.

## 2014-01-30 NOTE — Progress Notes (Signed)
   Subjective:    Patient ID: Brittany Byrd, female    DOB: 28-Aug-1971, 43 y.o.   MRN: [id number]  HPI Pt presents to the clinic to follow up on bronchitis. She was seen by me on 2/16 and then UC on 2/19. She has completed zpak, oral steroids, solumedrol and hycodan. CXR normal. She is feeling better but still has nagging cough. She is supposed to go on vacation in one week and wants to get better. Cough is not productive. She does feel like she still wheezes every so often. Denies any ear pain, fever, ST, sinus pressure. All in chest right now. Cough is still productive.    Review of Systems     Objective:   Physical Exam  Constitutional: She is oriented to person, place, and time. She appears well-developed and well-nourished.  HENT:  Head: Normocephalic and atraumatic.  Right Ear: External ear normal.  Left Ear: External ear normal.  Eyes: Conjunctivae are normal. Right eye exhibits no discharge. Left eye exhibits no discharge.  Neck: Normal range of motion. Neck supple.  Cardiovascular: Normal rate, regular rhythm and normal heart sounds.   Pulmonary/Chest: Effort normal and breath sounds normal.  Lymphadenopathy:    She has no cervical adenopathy.  Neurological: She is alert and oriented to person, place, and time.  Psychiatric: She has a normal mood and affect. Her behavior is normal.          Assessment & Plan:  asthmatic bronchitis- another shot of depo medrol with longer onset given today of 80mg . I do not see any signs of infection. I do not want to give an abx. Did give stronger cough syrup with tussionex at bedtime. Continue to use albuterol as needed for SOB and wheezing. Concerned her asthma might be in a more chronic flare. Started samples Qvar 1 puff BID of 73mcg daily and added zyrtec daily to regimen. Follow up in 1 month.  Call if not improving.

## 2014-05-11 ENCOUNTER — Other Ambulatory Visit: Payer: Self-pay | Admitting: Physician Assistant

## 2014-07-26 ENCOUNTER — Encounter: Payer: Self-pay | Admitting: Physician Assistant

## 2014-07-26 ENCOUNTER — Ambulatory Visit (INDEPENDENT_AMBULATORY_CARE_PROVIDER_SITE_OTHER): Payer: 59 | Admitting: Physician Assistant

## 2014-07-26 VITALS — BP 129/79 | HR 92 | Ht 60.0 in | Wt 193.0 lb

## 2014-07-26 DIAGNOSIS — R0602 Shortness of breath: Secondary | ICD-10-CM

## 2014-07-26 DIAGNOSIS — R112 Nausea with vomiting, unspecified: Secondary | ICD-10-CM

## 2014-07-26 DIAGNOSIS — J45901 Unspecified asthma with (acute) exacerbation: Secondary | ICD-10-CM

## 2014-07-26 MED ORDER — METHYLPREDNISOLONE ACETATE 80 MG/ML IJ SUSP: 80.0000 mg | Freq: Once | INTRAMUSCULAR | Status: DC

## 2014-07-26 MED ORDER — ALBUTEROL SULFATE 108 (90 BASE) MCG/ACT IN AEPB: 2.0000 | INHALATION_SPRAY | RESPIRATORY_TRACT | Status: DC | PRN

## 2014-07-26 MED ORDER — PROMETHAZINE HCL 25 MG PO TABS: 25.0000 mg | ORAL_TABLET | Freq: Four times a day (QID) | ORAL | Status: DC | PRN

## 2014-07-26 MED ORDER — IPRATROPIUM BROMIDE 0.02 % IN SOLN
0.5000 mg | Freq: Once | RESPIRATORY_TRACT | Status: AC
  Administered 2014-07-26: 0.5 mg via RESPIRATORY_TRACT

## 2014-07-26 MED ORDER — MONTELUKAST SODIUM 10 MG PO TABS: 10.0000 mg | ORAL_TABLET | Freq: Every day | ORAL | Status: DC

## 2014-07-26 MED ORDER — LOSARTAN POTASSIUM-HCTZ 50-12.5 MG PO TABS: ORAL_TABLET | ORAL | Status: DC

## 2014-07-26 NOTE — Patient Instructions (Addendum)
Depo shot given today.   respi-click given to use ever 4-6 hours 2 puffs for SOB.   Singulair to start daily.   Phenergan as needed for nausea.

## 2014-07-27 ENCOUNTER — Ambulatory Visit: Payer: 59 | Admitting: Physician Assistant

## 2014-07-31 NOTE — Progress Notes (Signed)
   Subjective:    Patient ID: Brittany Byrd, female    DOB: 1971/06/05, 43 y.o.   MRN: [id number]  HPI Pt presents to the clinic with SOB and wheezing for last week. Using albuterol but when she does gives her the shakes. She admits to it being an old inhaler. No fever, chills, sinus pressure, ear pain, ST. Yesterday started to feel nauseated after coughing. Cough is not productive. Not currently on any daily inhalers or anti-histamine.   Review of Systems  All other systems reviewed and are negative.      Objective:   Physical Exam  Constitutional: She is oriented to person, place, and time. She appears well-developed and well-nourished.  HENT:  Head: Normocephalic and atraumatic.  Right Ear: External ear normal.  Left Ear: External ear normal.  Nose: Nose normal.  Mouth/Throat: Oropharynx is clear and moist.  Eyes: Conjunctivae are normal. Right eye exhibits no discharge. Left eye exhibits no discharge.  Neck: Normal range of motion. Neck supple.  Cardiovascular: Normal rate, regular rhythm and normal heart sounds.   Pulmonary/Chest: Breath sounds normal.  Bilateral lung wheezing. Cleared with duoneb.   Lymphadenopathy:    She has no cervical adenopathy.  Neurological: She is alert and oriented to person, place, and time.  Skin: Skin is dry.  Psychiatric: She has a normal mood and affect. Her behavior is normal.          Assessment & Plan:  Asthma exacerbation/nausea-peak flows all in yellow zone. ipatropium was given in office today. Depo medrol shot 80mg  given today. Pt does not tolerate oral prednisone. Start back on singulair. Albuterol respiclick was given to use every 2-6 hours as needed for SOB/Wheezing. i think her previous shakes could be due to old inhaler.  Phenergan given for nausea. Discussed with pt if singulair does not stop exacerbation need to consider daily inhaler ICS to control asthma. Stay on zyrtec/claritin daily.

## 2014-08-30 ENCOUNTER — Telehealth: Payer: Self-pay

## 2014-08-30 MED ORDER — HYDROCOD POLST-CHLORPHEN POLST 10-8 MG/5ML PO LQCR: 5.0000 mL | Freq: Every evening | ORAL | Status: DC | PRN

## 2014-08-30 NOTE — Telephone Encounter (Signed)
Printed and up front. Patient is aware.

## 2014-08-30 NOTE — Telephone Encounter (Signed)
Ok for tussionex 55mL at bedtime 110mL.

## 2014-08-30 NOTE — Telephone Encounter (Signed)
Brittany Byrd called and left a message stating she wants the purple cough syrup prescription sent to pharmacy. She stated she has a cough and she knows what she needs so she just wants the prescription sent to pharmacy.

## 2014-09-08 ENCOUNTER — Other Ambulatory Visit: Payer: 59

## 2014-11-02 ENCOUNTER — Encounter: Payer: Self-pay | Admitting: Physician Assistant

## 2014-11-02 ENCOUNTER — Ambulatory Visit (INDEPENDENT_AMBULATORY_CARE_PROVIDER_SITE_OTHER): Payer: 59 | Admitting: Physician Assistant

## 2014-11-02 VITALS — BP 116/79 | HR 87 | Ht 60.0 in | Wt 204.0 lb

## 2014-11-02 DIAGNOSIS — I10 Essential (primary) hypertension: Secondary | ICD-10-CM

## 2014-11-02 DIAGNOSIS — M222X1 Patellofemoral disorders, right knee: Secondary | ICD-10-CM

## 2014-11-02 DIAGNOSIS — M7701 Medial epicondylitis, right elbow: Secondary | ICD-10-CM

## 2014-11-02 MED ORDER — ALBUTEROL SULFATE 108 (90 BASE) MCG/ACT IN AEPB: 2.0000 | INHALATION_SPRAY | RESPIRATORY_TRACT | Status: DC | PRN

## 2014-11-02 MED ORDER — MELOXICAM 15 MG PO TABS: 15.0000 mg | ORAL_TABLET | Freq: Every day | ORAL | Status: DC

## 2014-11-02 MED ORDER — TRAMADOL HCL 50 MG PO TABS: 50.0000 mg | ORAL_TABLET | Freq: Three times a day (TID) | ORAL | Status: DC | PRN

## 2014-11-02 MED ORDER — LOSARTAN POTASSIUM-HCTZ 50-12.5 MG PO TABS: ORAL_TABLET | ORAL | Status: DC

## 2014-11-02 MED ORDER — LOSARTAN POTASSIUM-HCTZ 50-12.5 MG PO TABS
ORAL_TABLET | ORAL | Status: DC
Start: 2014-11-02 — End: 2014-11-02

## 2014-11-02 NOTE — Patient Instructions (Addendum)
Medial Epicondylitis (Golfer's Elbow) with Rehab Medial epicondylitis involves inflammation and pain around the inner (medial) portion of the elbow. This pain is caused by inflammation of the tendons in the forearm that flex (bring down) the wrist. Medial epicondylitis is also called golfer's elbow, because it is common among golfers. However, it may occur in any individual who flexes the wrist regularly. If medial epicondylitis is left untreated, it may become a chronic problem. SYMPTOMS   Pain, tenderness, or inflammation over the inner (medial) side of the elbow.  Pain or weakness with gripping activities.  Pain that increases with wrist twisting motions (using a screwdriver, playing golf, bowling). CAUSES  Medial epicondylitis is caused by inflammation of the tendons that flex the wrist. Causes of injury may include:  Chronic, repetitive stress and strain to the tendons that run from the wrist and forearm to the elbow.  Sudden strain on the forearm, including wrist snap when serving balls with racquet sports, or throwing a baseball. RISK INCREASES WITH:  Sports or occupations that require repetitive and/or strenuous forearm and wrist movements (pitching a baseball, golfing, carpentry).  Poor wrist and forearm strength and flexibility.  Failure to warm up properly before activity.  Resuming activity before healing, rehabilitation, and conditioning are complete. PREVENTION   Warm up and stretch properly before activity.  Maintain physical fitness:  Strength, flexibility, and endurance.  Cardiovascular fitness.  Wear and use properly fitted equipment.  Learn and use proper technique and have a coach correct improper technique.  Wear a tennis elbow (counterforce) brace. PROGNOSIS  The course of this condition depends on the degree of the injury. If treated properly, acute cases (symptoms lasting less than 4 weeks) are often resolved in 2 to 6 weeks. Chronic (longer lasting  cases) often resolve in 3 to 6 months, but may require physical therapy. RELATED COMPLICATIONS   Frequently recurring symptoms, resulting in a chronic problem. Properly treating the problem the first time decreases frequency of recurrence.  Chronic inflammation, scarring, and partial tendon tear, requiring surgery.  Delayed healing or resolution of symptoms. TREATMENT  Treatment first involves the use of ice and medicine, to reduce pain and inflammation. Strengthening and stretching exercises may reduce discomfort, if performed regularly. These exercises may be performed at home, if the condition is an acute injury. Chronic cases may require a referral to a physical therapist for evaluation and treatment. Your caregiver may advise a corticosteroid injection to help reduce inflammation. Rarely, surgery is needed. MEDICATION  If pain medicine is needed, nonsteroidal anti-inflammatory medicines (aspirin and ibuprofen), or other minor pain relievers (acetaminophen), are often advised.  Do not take pain medicine for 7 days before surgery.  Prescription pain relievers may be given, if your caregiver thinks they are needed. Use only as directed and only as much as you need.  Corticosteroid injections may be recommended. These injections should be reserved only for the most severe cases, because they can only be given a certain number of times. HEAT AND COLD  Cold treatment (icing) should be applied for 10 to 15 minutes every 2 to 3 hours for inflammation and pain, and immediately after activity that aggravates your symptoms. Use ice packs or an ice massage.  Heat treatment may be used before performing stretching and strengthening activities prescribed by your caregiver, physical therapist, or athletic trainer. Use a heat pack or a warm water soak. SEEK MEDICAL CARE IF: Symptoms get worse or do not improve in 2 weeks, despite treatment. EXERCISES  RANGE OF MOTION (  ROM) AND STRETCHING EXERCISES -  Epicondylitis, Medial (Golfer's Elbow) These exercises may help you when beginning to rehabilitate your injury. Your symptoms may go away with or without further involvement from your physician, physical therapist or athletic trainer. While completing these exercises, remember:   Restoring tissue flexibility helps normal motion to return to the joints. This allows healthier, less painful movement and activity.  An effective stretch should be held for at least 30 seconds.  A stretch should never be painful. You should only feel a gentle lengthening or release in the stretched tissue. RANGE OF MOTION - Wrist Flexion, Active-Assisted  Extend your right / left elbow with your fingers pointing down.*  Gently pull the back of your hand towards you, until you feel a gentle stretch on the top of your forearm.  Hold this position for __________ seconds. Repeat __________ times. Complete this exercise __________ times per day.  *If directed by your physician, physical therapist or athletic trainer, complete this stretch with your elbow bent, rather than extended. RANGE OF MOTION - Wrist Extension, Active-Assisted  Extend your right / left elbow and turn your palm upwards.*  Gently pull your palm and fingertips back, so your wrist extends and your fingers point more toward the ground.  You should feel a gentle stretch on the inside of your forearm.  Hold this position for __________ seconds. Repeat __________ times. Complete this exercise __________ times per day. *If directed by your physician, physical therapist or athletic trainer, complete this stretch with your elbow bent, rather than extended. STRETCH - Wrist Extension   Place your right / left fingertips on a tabletop leaving your elbow slightly bent. Your fingers should point backwards.  Gently press your fingers and palm down onto the table, by straightening your elbow. You should feel a stretch on the inside of your forearm.  Hold  this position for __________ seconds. Repeat __________ times. Complete this stretch __________ times per day.  STRENGTHENING EXERCISES - Epicondylitis, Medial (Golfer's Elbow) These exercises may help you when beginning to rehabilitate your injury. They may resolve your symptoms with or without further involvement from your physician, physical therapist or athletic trainer. While completing these exercises, remember:   Muscles can gain both the endurance and the strength needed for everyday activities through controlled exercises.  Complete these exercises as instructed by your physician, physical therapist or athletic trainer. Increase the resistance and repetitions only as guided.  You may experience muscle soreness or fatigue, but the pain or discomfort you are trying to eliminate should never worsen during these exercises. If this pain does get worse, stop and make sure you are following the directions exactly. If the pain is still present after adjustments, discontinue the exercise until you can discuss the trouble with your caregiver. STRENGTH - Wrist Flexors  Sit with your right / left forearm palm-up, and fully supported on a table or countertop. Your elbow should be resting below the height of your shoulder. Allow your wrist to extend over the edge of the surface.  Loosely holding a __________ weight, or a piece of rubber exercise band or tubing, slowly curl your hand up toward your forearm.  Hold this position for __________ seconds. Slowly lower the wrist back to the starting position in a controlled manner. Repeat __________ times. Complete this exercise __________ times per day.  STRENGTH - Wrist Extensors  Sit with your right / left forearm palm-down and fully supported. Your elbow should be resting below the height of your shoulder.   Allow your wrist to extend over the edge of the surface.  Loosely holding a __________ weight, or a piece of rubber exercise band or tubing, slowly  curl your hand up toward your forearm.  Hold this position for __________ seconds. Slowly lower the wrist back to the starting position in a controlled manner. Repeat __________ times. Complete this exercise __________ times per day.  STRENGTH - Ulnar Deviators  Stand with a ____________________ weight in your right / left hand, or sit while holding a rubber exercise band or tubing, with your healthy arm supported on a table or countertop.  Move your wrist so that your pinkie travels toward your forearm and your thumb moves away from your forearm.  Hold this position for __________ seconds and then slowly lower the wrist back to the starting position. Repeat __________ times. Complete this exercise __________ times per day STRENGTH - Grip   Grasp a tennis ball, a dense sponge, or a large, rolled sock in your hand.  Squeeze as hard as you can, without increasing any pain.  Hold this position for __________ seconds. Release your grip slowly. Repeat __________ times. Complete this exercise __________ times per day.  STRENGTH - Forearm Supinators   Sit with your right / left forearm supported on a table, keeping your elbow below shoulder height. Rest your hand over the edge, palm down.  Gently grip a hammer or a soup ladle.  Without moving your elbow, slowly turn your palm and hand upward to a "thumbs-up" position.  Hold this position for __________ seconds. Slowly return to the starting position. Repeat __________ times. Complete this exercise __________ times per day.  STRENGTH - Forearm Pronators  Sit with your right / left forearm supported on a table, keeping your elbow below shoulder height. Rest your hand over the edge, palm up.  Gently grip a hammer or a soup ladle.  Without moving your elbow, slowly turn your palm and hand upward to a "thumbs-up" position.  Hold this position for __________ seconds. Slowly return to the starting position. Repeat __________ times. Complete  this exercise __________ times per day.  Document Released: 11/18/2005 Document Revised: 02/10/2012 Document Reviewed: 03/02/2009 [facility] Patient Information 2015 [place], [place]. This information is not intended to replace advice given to you by your health care provider. Make sure you discuss any questions you have with your health care provider.   Patellofemoral Syndrome If you have had pain in the front of your knee for a long time, chances are good that you have patellofemoral syndrome. The word patella refers to the kneecap. Femoral (or femur) refers to the thigh bone. That is the bone the kneecap sits on. The kneecap is shaped like a triangle. Its job is to protect the knee and to improve the efficiency of your thigh muscles (quadriceps). The underside of the kneecap is made of smooth tissue (cartilage). This lets the kneecap slide up and down as the knee moves. Sometimes this cartilage becomes soft. Your healthcare provider may say the cartilage breaks down. That is patellofemoral syndrome. It can affect one knee, or both. The condition is sometimes called patellofemoral pain syndrome. That is because the condition is painful. The pain usually gets worse with activity. Sitting for a long time with the knee bent also makes the pain worse. It usually gets better with rest and proper treatment. CAUSES  No one is sure why some people develop this problem and others do not. Runners often get it. One name for the condition is "  runner's knee." However, some people run for years and never have knee pain. Certain things seem to make patellofemoral syndrome more likely. They include:  Moving out of alignment. The kneecap is supposed to move in a straight line when the thigh muscle pulls on it. Sometimes the kneecap moves in poor alignment. That can make the knee swell and hurt. Some experts believe it also wears down the cartilage.  Injury to the kneecap.  Strain on the knee. This may occur during  sports activity. Soccer, running, skiing and cycling can put excess stress on the knee.  Being flat-footed or knock-kneed. SYMPTOMS   Knee pain.  Pain under the kneecap. This is usually a dull, aching pain.  Pain in the knee when doing certain things: squatting, kneeling, going up or down stairs.  Pain in the knee when you stand up after sitting down for awhile.  Tightness in the knee.  Loss of muscle strength in the thigh.  Swelling of the knee. DIAGNOSIS  Healthcare providers often send people with knee pain to an orthopedic caregiver. This person has special training to treat problems with bones and joints. To decide what is causing your knee pain, your caregiver will probably:  Do a physical exam. This will probably include:  Asking about symptoms you have noticed.  Asking about your activities and any injuries.  Feeling your knee. Moving it. This will help test the knee's strength. It will also check alignment (whether the knee and leg are aligned normally).  Order some tests, such as:  Imaging tests. They create pictures of the inside of the knee. Tests may include:  X-rays.  Computed tomography (CT) scan. This uses X-rays and a computer to show more detail.  Magnetic resonance imaging (MRI). This test uses magnets, radio waves and a computer to make pictures. TREATMENT   Medication is almost always used first. It can relieve pain. It also can reduce swelling. Non-steroidal anti-inflammatory medicines (called NSAIDs) are usually suggested. Sometimes a stronger form is needed. A stronger form would require a prescription.  Other treatment may be needed after the swelling goes down. Possibilities include:  Exercise. Certain exercises can make the muscles around the knee stronger which decreases the pressure on the knee cap. This includes the thigh muscle. Certain exercises also may be suggested to increase your flexibility.  A knee brace. This gives the knee extra  support and helps align the movement of the knee cap.  Orthotics. These are special shoe inserts. They can help keep your leg and knee aligned.  Surgery is sometimes needed. This is rare. Options include:  Arthroscopy. The surgeon uses a special tool to remove any damaged pieces of the kneecap. Only a few small incisions (cuts) are needed.  Realignment. This is open surgery. The goals are to reduce pressure and fix the way the kneecap moves. HOME CARE INSTRUCTIONS   Take any medication prescribed by your healthcare provider. Follow the directions carefully.  If your knee is swollen:  Put ice or cold packs on it. Do this for 20 to 30 minutes, 3 to 4 times a day.  Keep the knee raised. Make sure it is supported. Put a pillow under it.  Rest your knee. For example, take the elevator instead of the stairs for awhile. Or, take a break from sports activity that strain your knee. Try walking or swimming instead.  Whenever you are active:  Use an elastic bandage on your knee. This gives it support.  After any activity, put  ice or cold packs on your knees. Do this for about 10 to 20 minutes.  Make sure you wear shoes that give good support. Make sure they are not worn down. The heels should not slant in or out. SEEK MEDICAL CARE IF:   Knee pain gets worse. Or it does not go away, even after taking pain medicine.  Swelling does not go down.  Your thigh muscle becomes weak.  You have an oral temperature above 102 F (38.9 C). SEEK IMMEDIATE MEDICAL CARE IF:  You have an oral temperature above 102 F (38.9 C), not controlled by medicine. Document Released: 11/06/2009 Document Revised: 02/10/2012 Document Reviewed: 02/07/2014 [facility] Patient Information 2015 [place], [place]. This information is not intended to replace advice given to you by your health care provider. Make sure you discuss any questions you have with your health care provider.

## 2014-11-02 NOTE — Progress Notes (Signed)
   Subjective:    Patient ID: Brittany Byrd, female    DOB: [birth year], 43 y.o.   MRN: [id number]  HPI  Pt presents to the clinic with right elbow pain and right knee pain. Pain present for last 2 weeks. 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 Feels the same. No injury. Not tried anything to make better. Pain is off and on but worst with twisting motion.   Right knee off and on pain for over a year. Worse when going up stairs. No pain with twisting. No injury.     HTN- needs refill doing well and controlled. Denies any SOB, CP, or palpitations.     Review of Systems  All other systems reviewed and are negative.      Objective:   Physical Exam  Constitutional: She is oriented to person, place, and time. She appears well-developed and well-nourished.  HENT:  Head: Normocephalic and atraumatic.  Cardiovascular: Normal rate, regular rhythm and normal heart sounds.   Pulmonary/Chest: Effort normal and breath sounds normal.  Musculoskeletal:  Normal ROM of right knee and elbow.  Pain over medial epicondylitis.  Negative finkelstein.  Strength 5/5 bilaterally.  Patellar reflexes 2+ bilaterally.  No joint tenderness to palpation.  Pain around patella to palpation.   Neurological: She is alert and oriented to person, place, and time.  Psychiatric: She has a normal mood and affect. Her behavior is normal.          Assessment & Plan:  Medial epicondylitis- mobic daily for next 2 weeks. aircast tendonitis splint. Tramadol as needed for breakthrough pain.   Right, patellofemoral syndrome- home exercises given. mobic for 2 weeks. Tramadol for breakthrough pain. Follow up as needed.  HTN- refilled for 6 months. Looks great.

## 2014-11-04 ENCOUNTER — Encounter: Payer: Self-pay | Admitting: Emergency Medicine

## 2014-11-04 ENCOUNTER — Emergency Department (INDEPENDENT_AMBULATORY_CARE_PROVIDER_SITE_OTHER)
Admission: EM | Admit: 2014-11-04 | Discharge: 2014-11-04 | Disposition: A | Discharge status: Home / Self Care | Payer: 59 | Source: Home / Self Care | Attending: Family Medicine | Admitting: Family Medicine

## 2014-11-04 DIAGNOSIS — M7700 Medial epicondylitis, unspecified elbow: Secondary | ICD-10-CM | POA: Insufficient documentation

## 2014-11-04 DIAGNOSIS — B9789 Other viral agents as the cause of diseases classified elsewhere: Principal | ICD-10-CM

## 2014-11-04 DIAGNOSIS — J9801 Acute bronchospasm: Secondary | ICD-10-CM

## 2014-11-04 DIAGNOSIS — M222X9 Patellofemoral disorders, unspecified knee: Secondary | ICD-10-CM | POA: Insufficient documentation

## 2014-11-04 DIAGNOSIS — J069 Acute upper respiratory infection, unspecified: Secondary | ICD-10-CM

## 2014-11-04 MED ORDER — AMOXICILLIN 875 MG PO TABS: 875.0000 mg | ORAL_TABLET | Freq: Two times a day (BID) | ORAL | Status: DC

## 2014-11-04 MED ORDER — IPRATROPIUM-ALBUTEROL 0.5-2.5 (3) MG/3ML IN SOLN
3.0000 mL | Freq: Once | RESPIRATORY_TRACT | Status: AC
  Administered 2014-11-04: 3 mL via RESPIRATORY_TRACT

## 2014-11-04 MED ORDER — BUDESONIDE 90 MCG/ACT IN AEPB: INHALATION_SPRAY | RESPIRATORY_TRACT | Status: DC

## 2014-11-04 MED ORDER — FLUTICASONE PROPIONATE 50 MCG/ACT NA SUSP: NASAL | Status: DC

## 2014-11-04 MED ORDER — BENZONATATE 200 MG PO CAPS: 200.0000 mg | ORAL_CAPSULE | Freq: Every day | ORAL | Status: DC

## 2014-11-04 NOTE — ED Notes (Signed)
Reports about 2 days of having some cough, congestion, and today felt like having an asthma episode.

## 2014-11-04 NOTE — ED Provider Notes (Signed)
CSN: [id number]     Arrival date & time 11/04/14  1740 History   First MD Initiated Contact with Patient 11/04/14 1821     Chief Complaint  Patient presents with  . Wheezing  . Cough  . Hoarse     HPI Comments: Patient recalls developing mild shortness of breath one week ago.  Yesterday she developed increased sinus congestion and mild sore throat.  Today she developed myalgias, eye redness, chills, sweats, minimal cough and anterior chest tightness.  She found it necessary to use her ProAir inhaler last night.  The history is provided by the patient.    Past Medical History  Diagnosis Date  . Hypertension   . Seasonal allergies   . Migraine    Past Surgical History  Procedure Laterality Date  . Nasal septum surgery    . Dilation and curettage of uterus     Family History  Problem Relation Age of Onset  . Depression Mother   . Hypertension Mother   . Diabetes Father   . Hypertension Father   . Diabetes Maternal Grandmother   . Diabetes Paternal Grandmother    History  Substance Use Topics  . Smoking status: Never Smoker   . Smokeless tobacco: Never Used  . Alcohol Use: Yes     Comment: rare   OB History    No data available     Review of Systems + sore throat + cough No pleuritic pain but has anterior chest tightness No wheezing + nasal congestion + post-nasal drainage No sinus pain/pressure No itchy/red eyes No earache No hemoptysis + SOB No fever; +chills/sweats No nausea No vomiting No abdominal pain No diarrhea No urinary symptoms No skin rash + fatigue No myalgias No headache Used OTC meds without relief  Allergies  Review of patient's allergies indicates no known allergies.  Home Medications   Prior to Admission medications   Medication Sig Start Date End Date Taking? Authorizing Provider  Albuterol Sulfate (PROAIR RESPICLICK) 389 (90 BASE) MCG/ACT AEPB Inhale 2 puffs into the lungs every 4 (four) hours as needed. 11/02/14   Jade L  Breeback, PA-C  amoxicillin (AMOXIL) 875 MG tablet Take 1 tablet (875 mg total) by mouth 2 (two) times daily. (Rx void after 11/12/2014) 11/04/14   Kandra Nicolas, MD  aspirin 81 MG tablet Take 81 mg by mouth daily.    Historical Provider, MD  benzonatate (TESSALON) 200 MG capsule Take 1 capsule (200 mg total) by mouth at bedtime. Take as needed for cough 11/04/14   Kandra Nicolas, MD  Budesonide (PULMICORT FLEXHALER) 90 MCG/ACT inhaler Inhale two puffs into the lungs twice daily. 11/04/14   Kandra Nicolas, MD  diphenhydrAMINE (BENADRYL) 25 MG tablet Take 25 mg by mouth every 6 (six) hours as needed.    Historical Provider, MD  fluticasone Asencion Islam) 50 MCG/ACT nasal spray Place two sprays in each nostril once daily 11/04/14   Kandra Nicolas, MD  losartan-hydrochlorothiazide [facility]) 50-12.5 MG per tablet TAKE 1 TABLET DAILY 11/02/14   Jade L Breeback, PA-C  meloxicam (MOBIC) 15 MG tablet Take 1 tablet (15 mg total) by mouth daily. 11/02/14   Jade L Breeback, PA-C  montelukast (SINGULAIR) 10 MG tablet Take 1 tablet (10 mg total) by mouth at bedtime. 07/26/14   Jade L Breeback, PA-C  traMADol (ULTRAM) 50 MG tablet Take 1 tablet (50 mg total) by mouth every 8 (eight) hours as needed. 11/02/14   Jade L Breeback, PA-C  valACYclovir (VALTREX) 1000  MG tablet Take 1,000 mg by mouth daily.    Historical Provider, MD   BP 113/79 mmHg  Pulse 96  Temp(Src) 98 F (36.7 C) (Oral)  Resp 16  Ht 5' (1.524 m)  Wt 200 lb (90.719 kg)  BMI 39.06 kg/m2  SpO2 99%  LMP 10/16/2014 Physical Exam Nursing notes and Vital Signs reviewed. Appearance:  Patient appears stated age, and in no acute distress.  Patient is obese (BMI 39.1) Eyes:  Pupils are equal, round, and reactive to light and accomodation.  Extraocular movement is intact.  Conjunctivae are not inflamed  Ears:  Canals normal.  Tympanic membranes normal.  Nose:  Congested turbinates.  No sinus tenderness.   Pharynx:  Normal Neck:  Supple.  Tender enlarged  posterior nodes are palpated bilaterally  Lungs:  Clear to auscultation.  Breath sounds are equal.  Heart:  Regular rate and rhythm without murmurs, rubs, or gallops.  Abdomen:  Nontender without masses or hepatosplenomegaly.  Bowel sounds are present.  No CVA or flank tenderness.  Extremities:  No edema.  No calf tenderness Skin:  No rash present.   ED Course  Procedures  none   MDM   1. Viral URI with cough   2. Bronchospasm    Begin Pulmicort inhaler (patient prefers not to take prednisone).  Prescription written for Benzonatate [facility]) to take at bedtime for night-time cough.  Rx for Flonase nasal spray. Take plain Mucinex (1200 mg guaifenesin) twice daily for cough and congestion.   Increase fluid intake, rest. May use Afrin nasal spray (or generic oxymetazoline) twice daily for about 5 days.  Also recommend using saline nasal spray several times daily and saline nasal irrigation (AYR is a common brand).  Use Flonase nasal spray after using Afrin spray and saline rinse. Try warm salt water gargles for sore throat.  Stop all antihistamines for now, and other non-prescription cough/cold preparations. Continue Singulair. Begin Amoxicillin if not improving about one week or if persistent fever develops (Given a prescription to hold, with an expiration date)  Follow-up with family doctor if not improving 7 to 10 days.     Kandra Nicolas, MD 11/08/14 [phone]

## 2014-11-04 NOTE — Discharge Instructions (Signed)
Take plain Mucinex (1200 mg guaifenesin) twice daily for cough and congestion.   Increase fluid intake, rest. May use Afrin nasal spray (or generic oxymetazoline) twice daily for about 5 days.  Also recommend using saline nasal spray several times daily and saline nasal irrigation (AYR is a common brand).  Use Flonase nasal spray after using Afrin spray and saline rinse. Try warm salt water gargles for sore throat.  Stop all antihistamines for now, and other non-prescription cough/cold preparations. Continue Singulair. Begin Amoxicillin if not improving about one week or if persistent fever develops   Follow-up with family doctor if not improving 7 to 10 days.

## 2014-11-29 ENCOUNTER — Ambulatory Visit (INDEPENDENT_AMBULATORY_CARE_PROVIDER_SITE_OTHER): Payer: 59 | Admitting: Physician Assistant

## 2014-11-29 ENCOUNTER — Encounter: Payer: Self-pay | Admitting: Physician Assistant

## 2014-11-29 VITALS — BP 126/73 | HR 93 | Ht 60.0 in | Wt 202.0 lb

## 2014-11-29 DIAGNOSIS — J4541 Moderate persistent asthma with (acute) exacerbation: Secondary | ICD-10-CM

## 2014-11-29 MED ORDER — METHYLPREDNISOLONE SODIUM SUCC 125 MG IJ SOLR
125.0000 mg | Freq: Once | INTRAMUSCULAR | Status: AC
  Administered 2014-11-29: 125 mg via INTRAMUSCULAR

## 2014-11-29 NOTE — Progress Notes (Signed)
   Subjective:    Patient ID: Brittany Byrd, female    DOB: Aug 06, 1971, 43 y.o.   MRN: [id number]  HPI Pt presents to the clinic with worsening SOB, chest tightness and wheezing. She ran out of pulmicort and been having to use albuterol inhaler multiple times a day. Has a dry cough. No sinus pressure, fever, chills, productive cough. Hx of asthma.    Review of Systems  All other systems reviewed and are negative.      Objective:   Physical Exam  Constitutional: She is oriented to person, place, and time. She appears well-developed and well-nourished.  HENT:  Head: Normocephalic and atraumatic.  Cardiovascular: Normal rate, regular rhythm and normal heart sounds.   Pulmonary/Chest: Effort normal and breath sounds normal.  Neurological: She is alert and oriented to person, place, and time.  Psychiatric: She has a normal mood and affect. Her behavior is normal.          Assessment & Plan:  Asthma exacerbation- pt is not able to take oral prednisone. Solumedrol 125mg  given in office today. Restart pulmicort increased to 180 2 puffs bid. Can decrease back down to 90 Bid after stale for a few months. Pt reminded goal is to use albuterol as least as can. Samples given out of closet due to [place] not taking samples anymore. Follow up as needed or 3 months. Continue on singulair and zyrtec.

## 2014-12-30 ENCOUNTER — Other Ambulatory Visit: Payer: Self-pay | Admitting: Physician Assistant

## 2015-02-07 ENCOUNTER — Encounter: Payer: Self-pay | Admitting: *Deleted

## 2015-02-07 ENCOUNTER — Emergency Department (INDEPENDENT_AMBULATORY_CARE_PROVIDER_SITE_OTHER)
Admission: EM | Admit: 2015-02-07 | Discharge: 2015-02-07 | Disposition: A | Discharge status: Home / Self Care | Payer: 59 | Source: Home / Self Care | Attending: Emergency Medicine | Admitting: Emergency Medicine

## 2015-02-07 DIAGNOSIS — J1189 Influenza due to unidentified influenza virus with other manifestations: Secondary | ICD-10-CM

## 2015-02-07 DIAGNOSIS — J111 Influenza due to unidentified influenza virus with other respiratory manifestations: Secondary | ICD-10-CM

## 2015-02-07 MED ORDER — BENZONATATE 200 MG PO CAPS: ORAL_CAPSULE | ORAL | Status: DC

## 2015-02-07 MED ORDER — OSELTAMIVIR PHOSPHATE 75 MG PO CAPS: ORAL_CAPSULE | ORAL | Status: DC

## 2015-02-07 NOTE — ED Notes (Signed)
Pt c/o nasal congestion, nonproductive cough, and chills x 1 day.

## 2015-02-07 NOTE — ED Provider Notes (Signed)
CSN: [id number]     Arrival date & time 02/07/15  0806 History   First MD Initiated Contact with Patient 02/07/15 0818     Chief Complaint  Patient presents with  . Cough  . Nasal Congestion   (Consider location/radiation/quality/duration/timing/severity/associated sxs/prior Treatment) HPI FLU  HPI : Flu symptoms for about 1 day. Fever to 102 with chills, sweats, myalgias, fatigue, headache. Symptoms are progressively worsening, despite trying OTC fever reducing medicine and rest and fluids. Has decreased appetite, but tolerating some liquids by mouth. No history of recent tick bite.  Review of Systems: Positive for fatigue, mild nasal congestion, mild sore throat, mild swollen anterior neck glands, mild cough. Negative for acute vision changes, stiff neck, focal weakness, syncope, seizures, respiratory distress, vomiting, diarrhea, GU symptoms, new Rash.  Past Medical History  Diagnosis Date  . Hypertension   . Seasonal allergies   . Migraine    Past Surgical History  Procedure Laterality Date  . Nasal septum surgery    . Dilation and curettage of uterus     Family History  Problem Relation Age of Onset  . Depression Mother   . Hypertension Mother   . Diabetes Father   . Hypertension Father   . Diabetes Maternal Grandmother   . Diabetes Paternal Grandmother    History  Substance Use Topics  . Smoking status: Never Smoker   . Smokeless tobacco: Never Used  . Alcohol Use: Yes     Comment: rare   OB History    No data available     Review of Systems  Allergies  Review of patient's allergies indicates no known allergies.  Home Medications   Prior to Admission medications   Medication Sig Start Date End Date Taking? Authorizing Provider  Albuterol Sulfate (PROAIR RESPICLICK) 622 (90 BASE) MCG/ACT AEPB Inhale 2 puffs into the lungs every 4 (four) hours as needed. 11/02/14   Jade L Breeback, PA-C  aspirin 81 MG tablet Take 81 mg by mouth daily.    Historical  Provider, MD  benzonatate (TESSALON) 200 MG capsule Take 1 every 8 hours as needed for cough. 02/07/15   Jacqulyn Cane, MD  losartan-hydrochlorothiazide [facility]) 50-12.5 MG per tablet TAKE 1 TABLET DAILY 11/02/14   Donella Stade, PA-C  oseltamivir (TAMIFLU) 75 MG capsule Starting today, take 1 capsule by mouth twice a day for 5 days. 02/07/15   Jacqulyn Cane, MD  valACYclovir (VALTREX) 1000 MG tablet Take 1,000 mg by mouth daily.    Historical Provider, MD   BP 108/75 mmHg  Pulse 92  Temp(Src) 98.9 F (37.2 C) (Oral)  Resp 18  Ht 5' (1.524 m)  Wt 203 lb (92.08 kg)  BMI 39.65 kg/m2  SpO2 98%  LMP 02/03/2015 Physical Exam  Constitutional: She appears well-developed and well-nourished.  Non-toxic appearance. She appears ill (very fatigued, but no cardiorespiratory distress). No distress.  HENT:  Head: Normocephalic and atraumatic.  Right Ear: Tympanic membrane and external ear normal.  Left Ear: Tympanic membrane and external ear normal.  Nose: Rhinorrhea present.  Mouth/Throat: Mucous membranes are normal. Posterior oropharyngeal erythema (mild redness ) present. No oropharyngeal exudate.  Eyes: Conjunctivae are normal. Right eye exhibits no discharge. Left eye exhibits no discharge. No scleral icterus.  Neck: Neck supple.  Cardiovascular: Normal rate, regular rhythm and normal heart sounds.   Pulmonary/Chest: Breath sounds normal. No stridor. No respiratory distress. She has no wheezes. She has no rales.  Abdominal: Soft. There is no tenderness.  Musculoskeletal: She exhibits no  edema.  Lymphadenopathy:    She has cervical adenopathy (mild shoddy anterior cervical nodes).  Neurological: She is alert.  Skin: Skin is warm and intact. No rash noted. She is diaphoretic.  Psychiatric: She has a normal mood and affect.  Nursing note and vitals reviewed.   ED Course  Procedures (including critical care time) Labs Review Labs Reviewed - No data to display  Imaging Review No results  found.   MDM   1. Influenza with respiratory manifestations    patient's symptoms are classic for acute influenza . She declined rapid flu test today, and I agree to treat with Tamiflu, as I would treat whether the rapid test were positive or negative. Treatment options discussed, as well as risks, benefits, alternatives. Patient voiced understanding and agreement with the following plans: New Prescriptions   BENZONATATE (TESSALON) 200 MG CAPSULE    Take 1 every 8 hours as needed for cough.   OSELTAMIVIR (TAMIFLU) 75 MG CAPSULE    Starting today, take 1 capsule by mouth twice a day for 5 days.   Other symptomatic care discussed. Follow-up with your primary care doctor in 5-7 days if not improving, or sooner if symptoms become worse. Precautions discussed. Red flags discussed. Questions invited and answered. Patient voiced understanding and agreement.     Jacqulyn Cane, MD 02/07/15 [phone]

## 2015-02-19 ENCOUNTER — Encounter: Payer: Self-pay | Admitting: *Deleted

## 2015-02-19 ENCOUNTER — Emergency Department (INDEPENDENT_AMBULATORY_CARE_PROVIDER_SITE_OTHER)
Admission: EM | Admit: 2015-02-19 | Discharge: 2015-02-19 | Disposition: A | Discharge status: Home / Self Care | Payer: 59 | Source: Home / Self Care | Attending: Emergency Medicine | Admitting: Emergency Medicine

## 2015-02-19 DIAGNOSIS — R05 Cough: Secondary | ICD-10-CM

## 2015-02-19 DIAGNOSIS — J0100 Acute maxillary sinusitis, unspecified: Secondary | ICD-10-CM

## 2015-02-19 DIAGNOSIS — J209 Acute bronchitis, unspecified: Secondary | ICD-10-CM | POA: Diagnosis not present

## 2015-02-19 DIAGNOSIS — R059 Cough, unspecified: Secondary | ICD-10-CM

## 2015-02-19 MED ORDER — PROMETHAZINE-DM 6.25-15 MG/5ML PO SYRP: ORAL_SOLUTION | ORAL | Status: DC

## 2015-02-19 MED ORDER — PREDNISONE 20 MG PO TABS: 20.0000 mg | ORAL_TABLET | Freq: Two times a day (BID) | ORAL | Status: DC

## 2015-02-19 MED ORDER — AZITHROMYCIN 250 MG PO TABS: ORAL_TABLET | ORAL | Status: DC

## 2015-02-19 NOTE — ED Notes (Signed)
Pt was seen approx 10 days ago for cough and has had nausea and bloating since started on cough suppressant.  Decreased app also.

## 2015-02-19 NOTE — ED Provider Notes (Signed)
CSN: [id number]     Arrival date & time 02/19/15  1329 History   First MD Initiated Contact with Patient 02/19/15 1418     Chief Complaint  Patient presents with  . Bloated  . Nausea   (Consider location/radiation/quality/duration/timing/severity/associated sxs/prior Treatment) HPI Pt was seen 12 days ago for influenza, treated effectively with Tamiflu which resolved the fever and myalgias after 2-3 days. However, for past several days, complains of worsening cough, occasionally productive of discolored sputum and has had minimal nausea and bloating/soreness feeling bilateral ribs, that she feels is from excessive coughing.  Mildly decreased appetite but tolerating by mouth liquids and solids. She denies chance of pregnancy. Past Medical History  Diagnosis Date  . Hypertension   . Seasonal allergies   . Migraine    Past Surgical History  Procedure Laterality Date  . Nasal septum surgery    . Dilation and curettage of uterus     Family History  Problem Relation Age of Onset  . Depression Mother   . Hypertension Mother   . Diabetes Father   . Hypertension Father   . Diabetes Maternal Grandmother   . Diabetes Paternal Grandmother    History  Substance Use Topics  . Smoking status: Never Smoker   . Smokeless tobacco: Never Used  . Alcohol Use: Yes     Comment: rare   OB History    No data available     Review of Systems  All other systems reviewed and are negative.   Allergies  Review of patient's allergies indicates no known allergies.  Home Medications   Prior to Admission medications   Medication Sig Start Date End Date Taking? Authorizing Provider  Albuterol Sulfate (PROAIR RESPICLICK) 062 (90 BASE) MCG/ACT AEPB Inhale 2 puffs into the lungs every 4 (four) hours as needed. 11/02/14   Jade L Breeback, PA-C  aspirin 81 MG tablet Take 81 mg by mouth daily.    Historical Provider, MD  azithromycin (ZITHROMAX Z-PAK) 250 MG tablet Take 2 tablets on day one, then 1  tablet daily on days 2 through 5 02/19/15   Jacqulyn Cane, MD  benzonatate (TESSALON) 200 MG capsule Take 1 every 8 hours as needed for cough. 02/07/15   Jacqulyn Cane, MD  losartan-hydrochlorothiazide [facility]) 50-12.5 MG per tablet TAKE 1 TABLET DAILY 11/02/14   Donella Stade, PA-C  predniSONE (DELTASONE) 20 MG tablet Take 1 tablet (20 mg total) by mouth 2 (two) times daily with a meal. X 5 days 02/19/15   Jacqulyn Cane, MD  promethazine-dextromethorphan (PROMETHAZINE-DM) 6.25-15 MG/5ML syrup 5 ml po every 4-6 hours as needed for cough 02/19/15   Jacqulyn Cane, MD  valACYclovir (VALTREX) 1000 MG tablet Take 1,000 mg by mouth daily.    Historical Provider, MD   BP 119/83 mmHg  Pulse 99  Temp(Src) 97.7 F (36.5 C) (Oral)  Ht 5' (1.524 m)  Wt 203 lb (92.08 kg)  BMI 39.65 kg/m2  SpO2 97%  LMP 02/03/2015 Physical Exam  Constitutional: She is oriented to person, place, and time. She appears well-developed and well-nourished. No distress.  HENT:  Head: Normocephalic and atraumatic.  Right Ear: Tympanic membrane, external ear and ear canal normal.  Left Ear: Tympanic membrane, external ear and ear canal normal.  Nose: Mucosal edema and rhinorrhea present. Right sinus exhibits maxillary sinus tenderness. Left sinus exhibits maxillary sinus tenderness.  Mouth/Throat: Oropharynx is clear and moist. No oral lesions. No oropharyngeal exudate.  Eyes: Right eye exhibits no discharge. Left eye exhibits no  discharge. No scleral icterus.  Neck: Neck supple.  Cardiovascular: Normal rate, regular rhythm and normal heart sounds.   Pulmonary/Chest: Effort normal. She has no wheezes. She has rhonchi. She has no rales.  Occasional hacking cough noted. Some tenderness muscles right and left intercostal areas but no point tenderness over ribs  Abdominal: Soft. Bowel sounds are normal. She exhibits no distension and no mass. There is no tenderness. There is no rebound and no guarding.  Obese  Lymphadenopathy:    She  has no cervical adenopathy.  Neurological: She is alert and oriented to person, place, and time.  Skin: Skin is warm and dry.  Psychiatric: She has a normal mood and affect.  Nursing note and vitals reviewed.   ED Course  Procedures (including critical care time) Labs Review Labs Reviewed - No data to display  Imaging Review No results found. She declined chest x-ray  MDM   1. Acute bronchitis, unspecified organism   2. Acute maxillary sinusitis, recurrence not specified   3. Cough    Treatment options discussed, as well as risks, benefits, alternatives. Patient voiced understanding and agreement with the following plans: Discharge Medication List as of 02/19/2015  2:35 PM    START taking these medications   Details  azithromycin (ZITHROMAX Z-PAK) 250 MG tablet Take 2 tablets on day one, then 1 tablet daily on days 2 through 5, Normal    promethazine-dextromethorphan (PROMETHAZINE-DM) 6.25-15 MG/5ML syrup 5 ml po every 4-6 hours as needed for cough, Normal       because of persistent inflammatory cough, decided to also treat with prednisone 20 mg by mouth twice a day 5 days. Other symptomatic care discussed Follow-up with your primary care doctor in 5-7 days if not improving, or sooner if symptoms become worse. Precautions discussed. Red flags discussed. Questions invited and answered. Patient voiced understanding and agreement.     Jacqulyn Cane, MD 02/19/15 2229

## 2015-02-28 ENCOUNTER — Ambulatory Visit (INDEPENDENT_AMBULATORY_CARE_PROVIDER_SITE_OTHER): Payer: 59 | Admitting: Physician Assistant

## 2015-02-28 ENCOUNTER — Encounter: Payer: Self-pay | Admitting: Physician Assistant

## 2015-02-28 VITALS — BP 132/86 | HR 90 | Wt 205.0 lb

## 2015-02-28 DIAGNOSIS — R1012 Left upper quadrant pain: Secondary | ICD-10-CM

## 2015-02-28 DIAGNOSIS — R140 Abdominal distension (gaseous): Secondary | ICD-10-CM | POA: Diagnosis not present

## 2015-02-28 DIAGNOSIS — R1011 Right upper quadrant pain: Secondary | ICD-10-CM

## 2015-02-28 DIAGNOSIS — R1013 Epigastric pain: Secondary | ICD-10-CM

## 2015-02-28 LAB — POCT URINALYSIS DIPSTICK
BILIRUBIN UA: NEGATIVE
Glucose, UA: NEGATIVE
KETONES UA: NEGATIVE
LEUKOCYTES UA: NEGATIVE
Nitrite, UA: NEGATIVE
PH UA: 5.5
Protein, UA: NEGATIVE
RBC UA: NEGATIVE
Spec Grav, UA: 1.01
UROBILINOGEN UA: 0.2

## 2015-02-28 MED ORDER — OMEPRAZOLE 40 MG PO CPDR: 40.0000 mg | DELAYED_RELEASE_CAPSULE | Freq: Every day | ORAL | Status: DC

## 2015-02-28 NOTE — Patient Instructions (Addendum)
Will call with breath test results.  Given GI cocktail.  Start omeprazole daily.  Will call with blood results.  Stay on probiotic.

## 2015-03-01 ENCOUNTER — Encounter: Payer: Self-pay | Admitting: Physician Assistant

## 2015-03-01 LAB — H. PYLORI BREATH TEST: H. PYLORI BREATH TEST: NOT DETECTED

## 2015-03-01 NOTE — Progress Notes (Signed)
   Subjective:    Patient ID: Brittany Byrd, female    DOB: 10/02/1971, 44 y.o.   MRN: [id number]  HPI Pt is a 44 yo female who presents to the clinic with some upper abdominal pain and tenderness since 3/11 and trip to [place]. Prior to trip pt had been treated for bronchitis and the flu. She had been coughing a lot. Cough seems to be improving. Abdominal pain is off and on but at times very painful. Does seem to be worse after eating. No fever, chills, nausea, urinary symptoms. Bowel movements are unchanges. She does feel very bloated. Not tried anything to make better.    Review of Systems  All other systems reviewed and are negative.      Objective:   Physical Exam  Constitutional: She is oriented to person, place, and time. She appears well-developed and well-nourished.  HENT:  Head: Normocephalic and atraumatic.  Cardiovascular: Normal rate, regular rhythm and normal heart sounds.   Pulmonary/Chest: Effort normal and breath sounds normal. She has no wheezes.  No CVA tenderness.   Abdominal: Soft. Bowel sounds are normal.  Some slight distention.  Tenderness over entire upper abdomen. A little more over RUQ. No guarding or rebound.   Neurological: She is alert and oriented to person, place, and time.  Skin: Skin is warm and dry.  Psychiatric: She has a normal mood and affect. Her behavior is normal.          Assessment & Plan:  RUQ/epigastric/LUQ- unclear etiology. Given pt lipase and cmp to get drawn. Due to sudden nature will test for h.pylori. GI cocktail given in office today. Started on omperazole daily for next 2 weeks. Could certainly be some reflux or gastritis. Cannot rule out gallstones. Will go ahead and order u/s of abdomen. miralax if stools are hard and not frequent. Follow up if not improving or if symptoms worsening. UA negative for any blood, nitrates, or leukocytes.

## 2015-03-02 LAB — URINE CULTURE: Colony Count: 5000

## 2015-03-07 ENCOUNTER — Encounter: Payer: Self-pay | Admitting: Physician Assistant

## 2015-03-07 ENCOUNTER — Ambulatory Visit (INDEPENDENT_AMBULATORY_CARE_PROVIDER_SITE_OTHER): Payer: 59 | Admitting: Physician Assistant

## 2015-03-07 VITALS — BP 137/94 | HR 84 | Wt 204.0 lb

## 2015-03-07 DIAGNOSIS — E669 Obesity, unspecified: Secondary | ICD-10-CM

## 2015-03-07 DIAGNOSIS — Z0000 Encounter for general adult medical examination without abnormal findings: Secondary | ICD-10-CM | POA: Diagnosis not present

## 2015-03-07 DIAGNOSIS — Z13220 Encounter for screening for lipoid disorders: Secondary | ICD-10-CM

## 2015-03-07 DIAGNOSIS — Z131 Encounter for screening for diabetes mellitus: Secondary | ICD-10-CM

## 2015-03-07 MED ORDER — LOSARTAN POTASSIUM-HCTZ 50-12.5 MG PO TABS: ORAL_TABLET | ORAL | Status: DC

## 2015-03-07 NOTE — Patient Instructions (Signed)

## 2015-03-11 DIAGNOSIS — E669 Obesity, unspecified: Secondary | ICD-10-CM | POA: Insufficient documentation

## 2015-03-11 DIAGNOSIS — Z6839 Body mass index (BMI) 39.0-39.9, adult: Secondary | ICD-10-CM | POA: Insufficient documentation

## 2015-03-11 DIAGNOSIS — E6609 Other obesity due to excess calories: Secondary | ICD-10-CM | POA: Insufficient documentation

## 2015-03-11 NOTE — Progress Notes (Signed)
  Subjective:     Brittany Byrd is a 44 y.o. female and is here for a comprehensive physical exam. The patient reports no problems.  History   Social History  . Marital Status: Married    Spouse Name: N/A  . Number of Children: N/A  . Years of Education: N/A   Occupational History  . Not on file.   Social History Main Topics  . Smoking status: Never Smoker   . Smokeless tobacco: Never Used  . Alcohol Use: Yes     Comment: rare  . Drug Use: No  . Sexual Activity: Yes   Other Topics Concern  . Not on file   Social History Narrative   Health Maintenance  Topic Date Due  . HIV Screening  07/31/1984  . INFLUENZA VACCINE  07/03/2015  . MAMMOGRAM  12/06/2015  . PAP SMEAR  12/05/2017  . TETANUS/TDAP  12/02/2018    The following portions of the patient's history were reviewed and updated as appropriate: allergies, current medications, past family history, past medical history, past social history, past surgical history and problem list.  Review of Systems A comprehensive review of systems was negative.   Objective:    BP 137/94 mmHg  Pulse 84  Wt 204 lb (92.534 kg)  SpO2 98%  LMP 03/06/2015 General appearance: alert, cooperative, appears stated age and moderately obese Head: Normocephalic, without obvious abnormality, atraumatic Eyes: conjunctivae/corneas clear. PERRL, EOM's intact. Fundi benign. Ears: normal TM's and external ear canals both ears Nose: Nares normal. Septum midline. Mucosa normal. No drainage or sinus tenderness. Throat: lips, mucosa, and tongue normal; teeth and gums normal Neck: no adenopathy, no carotid bruit, no JVD, supple, symmetrical, trachea midline and thyroid not enlarged, symmetric, no tenderness/mass/nodules Back: symmetric, no curvature. ROM normal. No CVA tenderness. Lungs: clear to auscultation bilaterally Heart: regular rate and rhythm, S1, S2 normal, no murmur, click, rub or gallop Abdomen: soft, non-tender; bowel sounds normal; no  masses,  no organomegaly Extremities: extremities normal, atraumatic, no cyanosis or edema Pulses: 2+ and symmetric Skin: Skin color, texture, turgor normal. No rashes or lesions Lymph nodes: Cervical, supraclavicular, and axillary nodes normal. Neurologic: Grossly normal    Assessment:    Healthy female exam.      Plan:    CPE- pap and mammogram done by GYN this year. Vaccines up to date. Labs ordered. Discussed obesity. Encouraged 1500 calorie a day diet with regular exercise. If would like to consider medication intervention please follow up with visit. Encouraged vitamin D and calcium 800 untis and 1200mg .   HTN- doing great. Refilled for 6 months.  See After Visit Summary for Counseling Recommendations

## 2015-09-06 ENCOUNTER — Encounter: Payer: Self-pay | Admitting: Emergency Medicine

## 2015-09-06 ENCOUNTER — Emergency Department (INDEPENDENT_AMBULATORY_CARE_PROVIDER_SITE_OTHER)
Admission: EM | Admit: 2015-09-06 | Discharge: 2015-09-06 | Disposition: A | Discharge status: Home / Self Care | Payer: 59 | Source: Home / Self Care | Attending: Family Medicine | Admitting: Family Medicine

## 2015-09-06 DIAGNOSIS — B9789 Other viral agents as the cause of diseases classified elsewhere: Principal | ICD-10-CM

## 2015-09-06 DIAGNOSIS — J069 Acute upper respiratory infection, unspecified: Secondary | ICD-10-CM | POA: Diagnosis not present

## 2015-09-06 DIAGNOSIS — L03115 Cellulitis of right lower limb: Secondary | ICD-10-CM | POA: Diagnosis not present

## 2015-09-06 MED ORDER — MUPIROCIN 2 % EX OINT: 1.0000 "application " | TOPICAL_OINTMENT | Freq: Three times a day (TID) | CUTANEOUS | Status: DC

## 2015-09-06 MED ORDER — CEFDINIR 300 MG PO CAPS: 300.0000 mg | ORAL_CAPSULE | Freq: Two times a day (BID) | ORAL | Status: DC

## 2015-09-06 MED ORDER — TRIAMCINOLONE ACETONIDE 40 MG/ML IJ SUSP
40.0000 mg | Freq: Once | INTRAMUSCULAR | Status: AC
  Administered 2015-09-06: 40 mg via INTRAMUSCULAR

## 2015-09-06 NOTE — ED Provider Notes (Signed)
CSN: [id number]     Arrival date & time 09/06/15  1638 History   First MD Initiated Contact with Patient 09/06/15 1753     Chief Complaint  Patient presents with  . Nasal Congestion  . Otalgia  . Cough  . Generalized Body Aches      HPI Comments: Patient presents with two problems: 1)  48 hours ago she developed itchy eyes, sneezing, sore throat, and sinus congestion.  Yesterday she developed fever/chills, myalgias, shortness of breath, and pressure in her ears.  She has also had tightness in her anterior chest. She has a family history of asthma (mother) 2)  Five days ago she fell, striking her right anterior knee.  She reports that swelling in her right knee has resolved but there is a residual patch of erythema that is slowly increasing in size, and she is concerned for possible skin infection.  The history is provided by the patient.    Past Medical History  Diagnosis Date  . Hypertension   . Seasonal allergies   . Migraine    Past Surgical History  Procedure Laterality Date  . Nasal septum surgery    . Dilation and curettage of uterus     Family History  Problem Relation Age of Onset  . Depression Mother   . Hypertension Mother   . Diabetes Father   . Hypertension Father   . Diabetes Maternal Grandmother   . Diabetes Paternal Grandmother    Social History  Substance Use Topics  . Smoking status: Never Smoker   . Smokeless tobacco: Never Used  . Alcohol Use: Yes     Comment: rare   OB History    No data available     Review of Systems + sore throat + hoarse + cough No pleuritic pain No wheezing + nasal congestion + post-nasal drainage + sinus pain/pressure No itchy/red eyes ? earache No hemoptysis + SOB + fever, + chills No nausea No vomiting No abdominal pain No diarrhea No urinary symptoms No skin rash + fatigue + myalgias No headache + right anterior knee pain Used OTC meds without relief  Allergies  Review of patient's allergies  indicates no known allergies.  Home Medications   Prior to Admission medications   Medication Sig Start Date End Date Taking? Authorizing Provider  cefdinir (OMNICEF) 300 MG capsule Take 1 capsule (300 mg total) by mouth 2 (two) times daily. (Rx void after 09/14/15) 09/06/15   Kandra Nicolas, MD  losartan-hydrochlorothiazide [facility]) 50-12.5 MG per tablet TAKE 1 TABLET DAILY 03/07/15   Donella Stade, PA-C  mupirocin ointment (BACTROBAN) 2 % Apply 1 application topically 3 (three) times daily. 09/06/15   Kandra Nicolas, MD  vitamin E 400 UNIT capsule Take 400 Units by mouth daily.    Historical Provider, MD   Meds Ordered and Administered this Visit   Medications  triamcinolone acetonide (KENALOG-40) injection 40 mg (not administered)    BP 122/73 mmHg  Pulse 91  Temp(Src) 98.7 F (37.1 C) (Oral)  Resp 18  Ht 5' (1.524 m)  Wt 202 lb (91.627 kg)  BMI 39.45 kg/m2  SpO2 99% No data found.   Physical Exam Nursing notes and Vital Signs reviewed. Appearance:  Patient appears stated age, and in no acute distress.  Patient is obese (BMI 39.5) Eyes:  Pupils are equal, round, and reactive to light and accomodation.  Extraocular movement is intact.  Conjunctivae are not inflamed  Ears:  Canals normal.  Tympanic membranes normal.  Nose:  Mildly congested turbinates.  No sinus tenderness.    Pharynx:  Normal Neck:  Supple.  Slightly tender shotty posterior nodes are palpated bilaterally  Lungs:  Clear to auscultation.  Breath sounds are equal.  Moving air well. Heart:  Regular rate and rhythm without murmurs, rubs, or gallops.  Abdomen:  Nontender without masses or hepatosplenomegaly.  Bowel sounds are present.  No CVA or flank tenderness.  Extremities:  No edema.  No calf tenderness.  Right knee has 1.5cm diameter patch of erythema anteriorly over patella (no evidence pre-patellar bursitis).  Right knee has full range of motion  Skin:  No rash present.   ED Course  Procedures  None     MDM   1. Viral URI with cough   2. Cellulitis of right knee    Rx for Bactroban for right knee Administered Kenalog 40mg  IM Take plain guaifenesin (1200mg  extended release tabs such as Mucinex) twice daily, with plenty of water, for cough and congestion.  Get adequate rest.   May use Afrin nasal spray (or generic oxymetazoline) twice daily for about 5 days and then discontinue.  Also recommend using saline nasal spray several times daily and saline nasal irrigation (AYR is a common brand).  Use Flonase nasal spray each morning after using Afrin nasal spray and saline nasal irrigation. Try warm salt water gargles for sore throat.  Stop all antihistamines for now, and other non-prescription cough/cold preparations (may continue Nyquil at bedtime). Begin Omnicef (cefdinir) if not improving about one week or if persistent fever develops (Given a prescription to hold, with an expiration date)  Follow-up with family doctor if not improving about10 days.     Kandra Nicolas, MD 09/11/15 [phone]

## 2015-09-06 NOTE — ED Notes (Signed)
Reports congestion, cough, ear pain, sore throat and hoarseness. Had Flu vaccination 1 week ago.

## 2015-09-06 NOTE — Discharge Instructions (Signed)
Take plain guaifenesin (1200mg  extended release tabs such as Mucinex) twice daily, with plenty of water, for cough and congestion.  Get adequate rest.   May use Afrin nasal spray (or generic oxymetazoline) twice daily for about 5 days and then discontinue.  Also recommend using saline nasal spray several times daily and saline nasal irrigation (AYR is a common brand).  Use Flonase nasal spray each morning after using Afrin nasal spray and saline nasal irrigation. Try warm salt water gargles for sore throat.  Stop all antihistamines for now, and other non-prescription cough/cold preparations (may continue Nyquil at bedtime). Begin Omnicef (cefdinir) if not improving about one week or if persistent fever develops  Follow-up with family doctor if not improving about10 days.

## 2015-09-17 ENCOUNTER — Other Ambulatory Visit: Payer: Self-pay | Admitting: Physician Assistant

## 2015-12-01 ENCOUNTER — Ambulatory Visit (INDEPENDENT_AMBULATORY_CARE_PROVIDER_SITE_OTHER): Payer: 59 | Admitting: Physician Assistant

## 2015-12-01 ENCOUNTER — Encounter: Payer: Self-pay | Admitting: Physician Assistant

## 2015-12-01 VITALS — BP 130/53 | HR 91 | Ht 60.0 in | Wt 210.0 lb

## 2015-12-01 DIAGNOSIS — Z131 Encounter for screening for diabetes mellitus: Secondary | ICD-10-CM

## 2015-12-01 DIAGNOSIS — R05 Cough: Secondary | ICD-10-CM

## 2015-12-01 DIAGNOSIS — I10 Essential (primary) hypertension: Secondary | ICD-10-CM

## 2015-12-01 DIAGNOSIS — Z13220 Encounter for screening for lipoid disorders: Secondary | ICD-10-CM

## 2015-12-01 DIAGNOSIS — R059 Cough, unspecified: Secondary | ICD-10-CM

## 2015-12-01 MED ORDER — ALPRAZOLAM 0.5 MG PO TABS
0.5000 mg | ORAL_TABLET | Freq: Every evening | ORAL | Status: DC | PRN
Start: 2015-12-01 — End: 2016-08-28

## 2015-12-01 MED ORDER — LOSARTAN POTASSIUM-HCTZ 50-12.5 MG PO TABS: 1.0000 | ORAL_TABLET | Freq: Every day | ORAL | Status: DC

## 2015-12-01 MED ORDER — HYDROCODONE-HOMATROPINE 5-1.5 MG/5ML PO SYRP: 5.0000 mL | ORAL_SOLUTION | Freq: Every evening | ORAL | Status: DC | PRN

## 2015-12-01 MED ORDER — METRONIDAZOLE 0.75 % EX GEL: 1.0000 "application " | Freq: Two times a day (BID) | CUTANEOUS | Status: DC

## 2015-12-01 NOTE — Patient Instructions (Signed)
Prometrium for mood and sleep.

## 2015-12-05 NOTE — Progress Notes (Signed)
   Subjective:    Patient ID: Brittany Byrd, female    DOB: 1971/02/25, 45 y.o.   MRN: [id number]  HPI Pt is a 45 yo female who presents to the clinic for medication refills.   HTN- doing well no concerns. No CP, palpitations, HA's, dizziness or vision changes.  Taking medication daily.   She does have a dry cough. Started 3-4 days ago. Taking OTC delsym. She has had a cough syrup that knocked it out before and would like to have sent to pharmacy. No fever, chills, n/v/d, sinus pressure, ST, ear pain, wheezing or SOB. Hx of asthma.   She also has bilateral red cheeks and occasional acne flares. Not tried anything to make better.    Review of Systems  All other systems reviewed and are negative.      Objective:   Physical Exam  Constitutional: She is oriented to person, place, and time. She appears well-developed and well-nourished.  HENT:  Head: Normocephalic and atraumatic.  Right Ear: External ear normal.  Left Ear: External ear normal.  Nose: Nose normal.  Mouth/Throat: Oropharynx is clear and moist. No oropharyngeal exudate.  Eyes: Conjunctivae are normal. Right eye exhibits no discharge. Left eye exhibits no discharge.  Neck: Normal range of motion. Neck supple.  Cardiovascular: Normal rate, regular rhythm and normal heart sounds.   Pulmonary/Chest: Effort normal and breath sounds normal. She has no wheezes.  Lymphadenopathy:    She has no cervical adenopathy.  Neurological: She is oriented to person, place, and time.  Skin:  Bilateral erythematic cheeks with telangectasia an 2-3 pustules.   Psychiatric: She has a normal mood and affect. Her behavior is normal.          Assessment & Plan:  HTN- Hyzaar refilled.   Cough- hycdoan as needed for cough at bedtime. If continues or symptoms worsen call office.   Rosacea- try metrogel bid and see if helps. HO given of prevention.

## 2016-02-05 ENCOUNTER — Encounter: Payer: Self-pay | Admitting: Osteopathic Medicine

## 2016-02-05 ENCOUNTER — Ambulatory Visit (INDEPENDENT_AMBULATORY_CARE_PROVIDER_SITE_OTHER): Payer: 59 | Admitting: Osteopathic Medicine

## 2016-02-05 VITALS — BP 128/80 | HR 112 | Temp 99.2°F | Ht 60.0 in | Wt 207.0 lb

## 2016-02-05 DIAGNOSIS — R109 Unspecified abdominal pain: Secondary | ICD-10-CM | POA: Diagnosis not present

## 2016-02-05 DIAGNOSIS — B9789 Other viral agents as the cause of diseases classified elsewhere: Principal | ICD-10-CM

## 2016-02-05 DIAGNOSIS — N951 Menopausal and female climacteric states: Secondary | ICD-10-CM | POA: Diagnosis not present

## 2016-02-05 DIAGNOSIS — R110 Nausea: Secondary | ICD-10-CM

## 2016-02-05 DIAGNOSIS — J069 Acute upper respiratory infection, unspecified: Secondary | ICD-10-CM | POA: Diagnosis not present

## 2016-02-05 MED ORDER — HYDROCODONE-HOMATROPINE 5-1.5 MG/5ML PO SYRP: 5.0000 mL | ORAL_SOLUTION | Freq: Three times a day (TID) | ORAL | Status: DC | PRN

## 2016-02-05 MED ORDER — IPRATROPIUM BROMIDE 0.03 % NA SOLN
2.0000 | Freq: Three times a day (TID) | NASAL | Status: DC
Start: 2016-02-05 — End: 2016-03-06

## 2016-02-05 MED ORDER — ONDANSETRON 8 MG PO TBDP: 8.0000 mg | ORAL_TABLET | Freq: Three times a day (TID) | ORAL | Status: DC | PRN

## 2016-02-05 NOTE — Patient Instructions (Signed)
COLD/SINUS SYMPTOMS: Ok to also take Ibuprofen for pain/fever. Can continue DayQuil and NyQuil. I also wrote prescription for cough syrup - may cause sleepiness or nausea, can try taking nausea medication beforehand and also take nausea medicine prior to eating.   ABDOMINAL CRAMPS: may be exacerbated by viral illness, can also be due to period coming on. If pain persists, especially if you develop serious constipation, diarrhea, vomiting or other bad GI symptoms, let us know.   If you're no better 7 - 10 days after symptoms first started, or if you are feeling worse, let us know.

## 2016-02-05 NOTE — Progress Notes (Signed)
HPI: KINDRED THRAILKILL is a 45 y.o. female who presents to [facility] today for chief complaint of:  Chief Complaint  Patient presents with  . Cough  . Bloated    ABDOMINAL SWELLING    COUGHING/SINUSES . Location: sinuses, throat . Quality: congestion, subjective fever, myalgia, headaches  . Duration: few days . Modifying factors: tried NyQuil and DayQuil  . Assoc signs/symptoms: SEE ros  ABDOMINAL CRAMPING . Location: abdomen  . Quality: cramping and bloating, feeling gassy  . Duration: 2 days or so . Context: recently started HRT recently with OBGYN about a month ago. Having crampnig symptoms w/ menopause. Was having some success with HRT, should be on period due 02/05/16 but no bleeding yet  . Modifying factors: nothing makes better, worse a bit after Asian food last night, mild breakfast today doing better . Assoc signs/symptoms: no vomiting, no loose stool, no constipation (usually takes herbal blend of laxative).      Past medical, social and family history reviewed: Past Medical History  Diagnosis Date  . Hypertension   . Seasonal allergies   . Migraine    Past Surgical History  Procedure Laterality Date  . Nasal septum surgery    . Dilation and curettage of uterus     Social History  Substance Use Topics  . Smoking status: Never Smoker   . Smokeless tobacco: Never Used  . Alcohol Use: Yes     Comment: rare   Family History  Problem Relation Age of Onset  . Depression Mother   . Hypertension Mother   . Diabetes Father   . Hypertension Father   . Diabetes Maternal Grandmother   . Diabetes Paternal Grandmother     Current Outpatient Prescriptions  Medication Sig Dispense Refill  . ALPRAZolam (XANAX) 0.5 MG tablet Take 1 tablet (0.5 mg total) by mouth at bedtime as needed for anxiety. 60 tablet 2  . losartan-hydrochlorothiazide (HYZAAR) 50-12.5 MG tablet Take 1 tablet by mouth daily. 90 tablet 4  . [DISCONTINUED] Budesonide  (PULMICORT FLEXHALER) 90 MCG/ACT inhaler Inhale two puffs into the lungs twice daily. (Patient not taking: Reported on 11/29/2014) 1 Inhaler 0  . [DISCONTINUED] diphenhydrAMINE (BENADRYL) 25 MG tablet Take 25 mg by mouth every 6 (six) hours as needed.    . [DISCONTINUED] fluticasone (FLONASE) 50 MCG/ACT nasal spray Place two sprays in each nostril once daily 16 g 1  . [DISCONTINUED] montelukast (SINGULAIR) 10 MG tablet TAKE 1 TABLET AT BEDTIME 90 tablet 3   No current facility-administered medications for this visit.   Allergies  Allergen Reactions  . Singulair [Montelukast Sodium]     depressed      Review of Systems: CONSTITUTIONAL:  No  fever, no chills, No  unintentional weight changes HEAD/EYES/EARS/NOSE/THROAT: (+) sinus headache, no vision change, no hearing change, (+) sore throat, (+) sinus pressure CARDIAC: No  chest pain, No  pressure, No palpitations RESPIRATORY: (+) cough, No  shortness of breath/wheeze GASTROINTESTINAL: (+) nausea, No  vomiting, (+) abdominal pain as per HPI, No  blood in stool, No  diarrhea, No  constipation  MUSCULOSKELETAL: No  myalgia/arthralgia GENITOURINARY: No  abnormal genital bleeding/discharge  Exam:  BP 128/80 mmHg  Pulse 112  Ht 5' (1.524 m)  Wt 207 lb (93.895 kg)  BMI 40.43 kg/m2 Constitutional: VS see above. General Appearance: alert, well-developed, well-nourished, NAD Eyes: Normal lids and conjunctive, non-icteric sclera, Ears, Nose, Mouth, Throat: MMM, Normal external inspection ears/nares/mouth/lips/gums, TM normal bilaterally. Pharynx no erythema, no exudate.  Neck: No masses, trachea midline. No thyroid enlargement/tenderness/mass appreciated. No lymphadenopathy Respiratory: Normal respiratory effort. no wheeze, no rhonchi, no rales Cardiovascular: S1/S2 normal, no murmur, no rub/gallop auscultated. RRR. No lower extremity edema. Gastrointestinal: Nontender, no masses. No hepatomegaly, no splenomegaly. No hernia appreciated. Bowel  sounds normal. Rectal exam deferred.     No results found for this or any previous visit (from the past 72 hour(s)).    ASSESSMENT/PLAN: Symptoms consistent with viral URI, abdominal issues concerning for viral gastritis versus GYN cause, patient due to start her period, also recent medication change from OB/GYN. Treat symptomatically for now, benign exam, ER/RTC precautions reviewed and would consider imaging/labs at that time.   Viral URI with cough - Plan: HYDROcodone-homatropine (HYCODAN) 5-1.5 MG/5ML syrup, ipratropium (ATROVENT) 0.03 % nasal spray  Abdominal cramping  Perimenopause  Nausea without vomiting - Plan: ondansetron (ZOFRAN-ODT) 8 MG disintegrating tablet   Return if symptoms worsen or fail to improve.

## 2016-02-08 ENCOUNTER — Telehealth: Payer: Self-pay

## 2016-02-08 NOTE — Telephone Encounter (Signed)
Patient called stated that she was seen on 01/08/2016. She stated that she was told to call back if she had any other symptoms, she has started to have diarrhea. Patient wants to know what she can do considering that she is planning to go out of town  In the next couple of days. Please advise. Rhonda Cunningham,CMA

## 2016-02-08 NOTE — Telephone Encounter (Signed)
Spoke to patient gave her information as noted below. Patient advised to follow up with PCP if diarrhea persist. [place]

## 2016-02-08 NOTE — Telephone Encounter (Signed)
At her visit a few days ago, we discussed possible viral cause for her symptoms, I don't see any history of chronic diarrhea noted in her chart, please confirm that this is the case. If she is concerned, I would recommend that she try over-the-counter treatments such as Pepto-Bismol and/or Imodium. Would recommend scheduling follow-up with her PCP to address possible further workup if diarrhea isn't resolving after 7-10 days, come in sooner if she is concerned for serious pain, dehydration, or other worrisome symptoms.

## 2016-03-06 ENCOUNTER — Ambulatory Visit (INDEPENDENT_AMBULATORY_CARE_PROVIDER_SITE_OTHER): Payer: 59 | Admitting: Physician Assistant

## 2016-03-06 ENCOUNTER — Encounter: Payer: Self-pay | Admitting: Physician Assistant

## 2016-03-06 VITALS — BP 111/71 | HR 96 | Ht 60.0 in | Wt 204.0 lb

## 2016-03-06 DIAGNOSIS — R1012 Left upper quadrant pain: Secondary | ICD-10-CM | POA: Diagnosis not present

## 2016-03-06 DIAGNOSIS — K5909 Other constipation: Secondary | ICD-10-CM

## 2016-03-06 DIAGNOSIS — R5383 Other fatigue: Secondary | ICD-10-CM

## 2016-03-06 DIAGNOSIS — K5900 Constipation, unspecified: Secondary | ICD-10-CM | POA: Diagnosis not present

## 2016-03-06 LAB — CBC
HCT: 36.8 % (ref 35.0–45.0)
HEMOGLOBIN: 12.1 g/dL (ref 11.7–15.5)
MCH: 26.1 pg — ABNORMAL LOW (ref 27.0–33.0)
MCHC: 32.9 g/dL (ref 32.0–36.0)
MCV: 79.5 fL — ABNORMAL LOW (ref 80.0–100.0)
MPV: 9.3 fL (ref 7.5–12.5)
PLATELETS: 339 10*3/uL (ref 140–400)
RBC: 4.63 MIL/uL (ref 3.80–5.10)
RDW: 14.9 % (ref 11.0–15.0)
WBC: 11.2 10*3/uL — AB (ref 3.8–10.8)

## 2016-03-06 MED ORDER — LINACLOTIDE 290 MCG PO CAPS: 290.0000 ug | ORAL_CAPSULE | Freq: Every day | ORAL | Status: DC

## 2016-03-06 NOTE — Progress Notes (Signed)
   Subjective:    Patient ID: Brittany Byrd, female    DOB: May 19, 1971, 45 y.o.   MRN: [id number]  HPI Pt presents to the clinic with LUQ pain and constipation. Constipation and abdominal pain is ongoing. She had U/S and CT last year which was essentially normal with gas/constipation seen on images. She is on bentyl and probiotic which helps minimally. miralax tried and failed in past as well.She thinks if she could have good bowel movements she would feel better. Her LUQ pain is off and on. Denies any nausea or vomiting. BM are every few days and small hard pebbles. No blood reported. No fever, chills, body aches. She is completely worn out. She has no energy. She wants to make sure vitamin d and b12 are good.    Review of Systems  All other systems reviewed and are negative.      Objective:   Physical Exam  Constitutional: She appears well-developed and well-nourished.  Obese.   HENT:  Head: Normocephalic and atraumatic.  Cardiovascular: Normal rate and regular rhythm.   Pulmonary/Chest: Effort normal and breath sounds normal.  Abdominal: Bowel sounds are normal. She exhibits no mass. There is no rebound and no guarding.  Distended over LUQ and mildly tender to palpation. No rebound or guarding.   Psychiatric: She has a normal mood and affect. Her behavior is normal.          Assessment & Plan:  Chronic constipation/LUQ pain- linzess given with coupon card. Discussed side effects. Continue with probiotic. Follow up in 3 months.    No energy- fatigue panel ordered to look for any causes of fatigue. Perhaps constipation as well causing low energy. Will follow up after get results and determine treatment plan.

## 2016-03-07 LAB — COMPREHENSIVE METABOLIC PANEL
ALBUMIN: 4.4 g/dL (ref 3.6–5.1)
ALK PHOS: 44 U/L (ref 33–115)
ALT: 13 U/L (ref 6–29)
AST: 13 U/L (ref 10–35)
BUN: 11 mg/dL (ref 7–25)
CHLORIDE: 99 mmol/L (ref 98–110)
CO2: 25 mmol/L (ref 20–31)
Calcium: 9.5 mg/dL (ref 8.6–10.2)
Creat: 0.67 mg/dL (ref 0.50–1.10)
Glucose, Bld: 130 mg/dL — ABNORMAL HIGH (ref 65–99)
POTASSIUM: 3.9 mmol/L (ref 3.5–5.3)
Sodium: 136 mmol/L (ref 135–146)
TOTAL PROTEIN: 7 g/dL (ref 6.1–8.1)
Total Bilirubin: 0.3 mg/dL (ref 0.2–1.2)

## 2016-03-07 LAB — VITAMIN D 25 HYDROXY (VIT D DEFICIENCY, FRACTURES): VIT D 25 HYDROXY: 27 ng/mL — AB (ref 30–100)

## 2016-03-07 LAB — TSH: TSH: 1.03 m[IU]/L

## 2016-03-07 LAB — FERRITIN: FERRITIN: 28 ng/mL (ref 10–232)

## 2016-03-07 LAB — VITAMIN B12: Vitamin B-12: 549 pg/mL (ref 200–1100)

## 2016-05-03 ENCOUNTER — Ambulatory Visit (INDEPENDENT_AMBULATORY_CARE_PROVIDER_SITE_OTHER): Payer: 59 | Admitting: Physician Assistant

## 2016-05-03 ENCOUNTER — Encounter: Payer: Self-pay | Admitting: Physician Assistant

## 2016-05-03 VITALS — BP 117/73 | HR 82 | Temp 98.0°F | Ht 60.0 in | Wt 206.0 lb

## 2016-05-03 DIAGNOSIS — R05 Cough: Secondary | ICD-10-CM

## 2016-05-03 DIAGNOSIS — J069 Acute upper respiratory infection, unspecified: Secondary | ICD-10-CM

## 2016-05-03 DIAGNOSIS — R062 Wheezing: Secondary | ICD-10-CM

## 2016-05-03 DIAGNOSIS — R059 Cough, unspecified: Secondary | ICD-10-CM

## 2016-05-03 MED ORDER — AZITHROMYCIN 250 MG PO TABS: ORAL_TABLET | ORAL | Status: DC

## 2016-05-03 MED ORDER — METHYLPREDNISOLONE SODIUM SUCC 125 MG IJ SOLR
125.0000 mg | Freq: Once | INTRAMUSCULAR | Status: AC
  Administered 2016-05-03: 125 mg via INTRAMUSCULAR

## 2016-05-03 MED ORDER — IPRATROPIUM-ALBUTEROL 0.5-2.5 (3) MG/3ML IN SOLN
3.0000 mL | Freq: Once | RESPIRATORY_TRACT | Status: AC
  Administered 2016-05-03: 3 mL via RESPIRATORY_TRACT

## 2016-05-03 NOTE — Progress Notes (Signed)
   Subjective:    Patient ID: Brittany Byrd, female    DOB: 1971/08/30, 45 y.o.   MRN: [id number]  HPI Patient is a 45 year old female who presents to the clinic with one week of sore throat, cough, sinus pressure, headache, nasal congestion. She has tried over-the-counter Sudafed and Tylenol Cold and sinus. She has had little relief. She does have a history of asthma and feels like she's becoming more short of breath. When she lays down at night she does complain of wheezing. Her cough is dry. She denies any fever, chills, body aches. She has not been using her albuterol rescue inhaler because she use an expired 1 and it made her sick. She is scared to use albuterol at this time.   Review of Systems  All other systems reviewed and are negative.      Objective:   Physical Exam  Constitutional: She is oriented to person, place, and time. She appears well-developed and well-nourished.  HENT:  Head: Normocephalic and atraumatic.  Right Ear: External ear normal.  Left Ear: External ear normal.  TM's erythematous.  Oropharynx erythematous with PND.  Bilateral nasal turbinates red and swollen.  Tenderness over maxillary and frontal sinuses to palpation.   Eyes: Conjunctivae are normal. Right eye exhibits no discharge. Left eye exhibits no discharge.  Neck: Normal range of motion. Neck supple.  Cardiovascular: Normal rate, regular rhythm and normal heart sounds.   Pulmonary/Chest: Effort normal.  Expiratory wheezing bilateral lung bases.  Resolved after duoneb.   Lymphadenopathy:    She has no cervical adenopathy.  Neurological: She is alert and oriented to person, place, and time.  Psychiatric: She has a normal mood and affect. Her behavior is normal.          Assessment & Plan:  URI/cough/wheezing-duoneb given in office today with mild improvement. Lungs sounds good after neb. Solumedrol 125mg  IM. zpak given. Discussed flonase and mucinex.  Follow up as needed.

## 2016-05-03 NOTE — Patient Instructions (Signed)
Upper Respiratory Infection, Adult Most upper respiratory infections (URIs) are a viral infection of the air passages leading to the lungs. A URI affects the nose, throat, and upper air passages. The most common type of URI is nasopharyngitis and is typically referred to as "the common cold." URIs run their course and usually go away on their own. Most of the time, a URI does not require medical attention, but sometimes a bacterial infection in the upper airways can follow a viral infection. This is called a secondary infection. Sinus and middle ear infections are common types of secondary upper respiratory infections. Bacterial pneumonia can also complicate a URI. A URI can worsen asthma and chronic obstructive pulmonary disease (COPD). Sometimes, these complications can require emergency medical care and may be life threatening.  CAUSES Almost all URIs are caused by viruses. A virus is a type of germ and can spread from one person to another.  RISKS FACTORS You may be at risk for a URI if:   You smoke.   You have chronic heart or lung disease.  You have a weakened defense (immune) system.   You are very young or very old.   You have nasal allergies or asthma.  You work in crowded or poorly ventilated areas.  You work in health care facilities or schools. SIGNS AND SYMPTOMS  Symptoms typically develop 2-3 days after you come in contact with a cold virus. Most viral URIs last 7-10 days. However, viral URIs from the influenza virus (flu virus) can last 14-18 days and are typically more severe. Symptoms may include:   Runny or stuffy (congested) nose.   Sneezing.   Cough.   Sore throat.   Headache.   Fatigue.   Fever.   Loss of appetite.   Pain in your forehead, behind your eyes, and over your cheekbones (sinus pain).  Muscle aches.  DIAGNOSIS  Your health care provider may diagnose a URI by:  Physical exam.  Tests to check that your symptoms are not due to  another condition such as:  Strep throat.  Sinusitis.  Pneumonia.  Asthma. TREATMENT  A URI goes away on its own with time. It cannot be cured with medicines, but medicines may be prescribed or recommended to relieve symptoms. Medicines may help:  Reduce your fever.  Reduce your cough.  Relieve nasal congestion. HOME CARE INSTRUCTIONS   Take medicines only as directed by your health care provider.   Gargle warm saltwater or take cough drops to comfort your throat as directed by your health care provider.  Use a warm mist humidifier or inhale steam from a shower to increase air moisture. This may make it easier to breathe.  Drink enough fluid to keep your urine clear or pale yellow.   Eat soups and other clear broths and maintain good nutrition.   Rest as needed.   Return to work when your temperature has returned to normal or as your health care provider advises. You may need to stay home longer to avoid infecting others. You can also use a face mask and careful hand washing to prevent spread of the virus.  Increase the usage of your inhaler if you have asthma.   Do not use any tobacco products, including cigarettes, chewing tobacco, or electronic cigarettes. If you need help quitting, ask your health care provider. PREVENTION  The best way to protect yourself from getting a cold is to practice good hygiene.   Avoid oral or hand contact with people with cold   symptoms.   Wash your hands often if contact occurs.  There is no clear evidence that vitamin C, vitamin E, echinacea, or exercise reduces the chance of developing a cold. However, it is always recommended to get plenty of rest, exercise, and practice good nutrition.  SEEK MEDICAL CARE IF:   You are getting worse rather than better.   Your symptoms are not controlled by medicine.   You have chills.  You have worsening shortness of breath.  You have brown or red mucus.  You have yellow or brown nasal  discharge.  You have pain in your face, especially when you bend forward.  You have a fever.  You have swollen neck glands.  You have pain while swallowing.  You have white areas in the back of your throat. SEEK IMMEDIATE MEDICAL CARE IF:   You have severe or persistent:  Headache.  Ear pain.  Sinus pain.  Chest pain.  You have chronic lung disease and any of the following:  Wheezing.  Prolonged cough.  Coughing up blood.  A change in your usual mucus.  You have a stiff neck.  You have changes in your:  Vision.  Hearing.  Thinking.  Mood. MAKE SURE YOU:   Understand these instructions.  Will watch your condition.  Will get help right away if you are not doing well or get worse.   This information is not intended to replace advice given to you by your health care provider. Make sure you discuss any questions you have with your health care provider.   Document Released: 05/14/2001 Document Revised: 04/04/2015 Document Reviewed: 02/23/2014 Elsevier Interactive Patient Education 2016 [employer]  

## 2016-06-28 ENCOUNTER — Emergency Department (INDEPENDENT_AMBULATORY_CARE_PROVIDER_SITE_OTHER)
Admission: EM | Admit: 2016-06-28 | Discharge: 2016-06-28 | Disposition: A | Discharge status: Home / Self Care | Payer: 59 | Source: Home / Self Care | Attending: Family Medicine | Admitting: Family Medicine

## 2016-06-28 ENCOUNTER — Encounter: Payer: Self-pay | Admitting: *Deleted

## 2016-06-28 DIAGNOSIS — H9311 Tinnitus, right ear: Secondary | ICD-10-CM | POA: Diagnosis not present

## 2016-06-28 DIAGNOSIS — R519 Headache, unspecified: Secondary | ICD-10-CM

## 2016-06-28 DIAGNOSIS — R51 Headache: Secondary | ICD-10-CM

## 2016-06-28 MED ORDER — DEXAMETHASONE SODIUM PHOSPHATE 10 MG/ML IJ SOLN
10.0000 mg | Freq: Once | INTRAMUSCULAR | Status: AC
  Administered 2016-06-28: 10 mg via INTRAMUSCULAR

## 2016-06-28 NOTE — ED Triage Notes (Signed)
Pt reports tinnitus lasting for a few days 1 month ago. About 1 week ago it returned with right facial pain and a "buring ball sensation" in the back of her head. She reports the tinnitus resolved once her menstrual cycle began last month. She is perimenopausal. Taking Naproxen @ home.

## 2016-06-28 NOTE — ED Provider Notes (Signed)
CSN: [id number]     Arrival date & time 06/28/16  49 History   First MD Initiated Contact with Patient 06/28/16 1546     Chief Complaint  Patient presents with  . Tinnitus  . Head Pain   (Consider location/radiation/quality/duration/timing/severity/associated sxs/prior Treatment) HPI  Brittany Byrd is a 45 y.o. female presenting to UC with c/o intermittent Right ear tinnitus described as a ringing in her ear for 1 month.  Over the last 1 week, ringing came back with associated Right sided facial pain and "a burning ball sensation" in the Right side of back of her head.  Tinnitus seems to resolve with her menstrual cycle but states she is pre-menopausal so her cycles are not as heavy or regular as they use to be.  She has taken Naproxen at home with moderate relief of posterior right sided head pain.  She also reports having a massage yesterday, which helped improve some of the pain.  She reports hx of migraines and states they were very back until she turned about 45yo. Prior to that, her migraines were worse around the time of her cycles. She has tried hormone replacement therapy in the past but states she did not like how it made her feel so she stopped.  Denies nausea, vomiting, or photophobia. Denies fever, chills, congestion, or recent head injury. No hx of vertigo. Denies dizziness or change in vision.    Past Medical History:  Diagnosis Date  . Hypertension   . Migraine   . Seasonal allergies    Past Surgical History:  Procedure Laterality Date  . DILATION AND CURETTAGE OF UTERUS    . NASAL SEPTUM SURGERY     Family History  Problem Relation Age of Onset  . Depression Mother   . Hypertension Mother   . Diabetes Father   . Hypertension Father   . Diabetes Maternal Grandmother   . Diabetes Paternal Grandmother    Social History  Substance Use Topics  . Smoking status: Never Smoker  . Smokeless tobacco: Never Used  . Alcohol use Yes     Comment: rare   OB History    No  data available     Review of Systems  Constitutional: Negative for chills, fatigue and fever.  HENT: Positive for ear pain (Right side) and tinnitus ( Right ). Negative for congestion, facial swelling, rhinorrhea and sinus pressure.   Gastrointestinal: Negative for abdominal pain, diarrhea, nausea and vomiting.  Musculoskeletal: Negative for back pain, neck pain and neck stiffness.  Skin: Negative for color change and rash.  Neurological: Positive for headaches. Negative for dizziness and light-headedness.    Allergies  Singulair [montelukast sodium]  Home Medications   Prior to Admission medications   Medication Sig Start Date End Date Taking? Authorizing Provider  ALPRAZolam Duanne Moron) 0.5 MG tablet Take 1 tablet (0.5 mg total) by mouth at bedtime as needed for anxiety. 12/01/15   Donella Stade, PA-C  dicyclomine (BENTYL) 20 MG tablet  01/18/16   Historical Provider, MD  JINTELI 1-5 MG-MCG TABS tablet  01/18/16   Historical Provider, MD  Linaclotide (LINZESS) 290 MCG CAPS capsule Take 1 capsule (290 mcg total) by mouth daily. 03/06/16   Jade L Breeback, PA-C  losartan-hydrochlorothiazide (HYZAAR) 50-12.5 MG tablet Take 1 tablet by mouth daily. 12/01/15   Jade L Breeback, PA-C  ondansetron (ZOFRAN-ODT) 8 MG disintegrating tablet Take 1 tablet (8 mg total) by mouth every 8 (eight) hours as needed for nausea. 02/05/16   Emeterio Reeve,  DO  Probiotic Product (PROBIOTIC ADVANCED PO) Take by mouth.    Historical Provider, MD   Meds Ordered and Administered this Visit   Medications  dexamethasone (DECADRON) injection 10 mg (10 mg Intramuscular Given 06/28/16 1612)    BP 132/85 (BP Location: Left Arm)   Pulse 90   Temp 97.5 F (36.4 C) (Oral)   Wt 209 lb (94.8 kg)   LMP 06/05/2016   SpO2 100%   BMI 40.82 kg/m  No data found.   Physical Exam  Constitutional: She is oriented to person, place, and time. She appears well-developed and well-nourished.  HENT:  Head: Normocephalic and  atraumatic.    Right Ear: Tympanic membrane normal.  Left Ear: Tympanic membrane normal.  Nose: Nose normal.  Mouth/Throat: Uvula is midline, oropharynx is clear and moist and mucous membranes are normal.  Mild tenderness to Right posterior head. No masses or crepitus palpated.   Eyes: EOM are normal.  Neck: Normal range of motion.  Cardiovascular: Normal rate and regular rhythm.   Pulmonary/Chest: Effort normal and breath sounds normal. No respiratory distress. She has no wheezes. She has no rales.  Musculoskeletal: Normal range of motion.  Neurological: She is alert and oriented to person, place, and time. She has normal strength. No cranial nerve deficit or sensory deficit. GCS eye subscore is 4. GCS verbal subscore is 5.  Skin: Skin is warm and dry.  Psychiatric: She has a normal mood and affect. Her behavior is normal.  Nursing note and vitals reviewed.   Urgent Care Course   Clinical Course    Procedures (including critical care time)  Labs Review Labs Reviewed - No data to display  Imaging Review No results found.   MDM   1. Tinnitus of right ear   2. Right-sided headache    Pt c/o Right ear tinnitus and right sided head pain. Tinnitus improved after last month's menstrual cycle.   Mild tenderness to posterior Right side of head, otherwise normal exam. Discussed treatment options. Pt states she cannot tolerate PO prednisone but can do the shot form.  Tx in UC: Decadron 10mg  IM, may help with pain and tinnitus. Encouraged f/u with PCP and possibly ENT if tinnitus persists. Patient verbalized understanding and agreement with treatment plan.    Noland Fordyce, PA-C 06/28/16 1645

## 2016-07-05 ENCOUNTER — Encounter: Payer: Self-pay | Admitting: Physician Assistant

## 2016-07-05 ENCOUNTER — Ambulatory Visit (INDEPENDENT_AMBULATORY_CARE_PROVIDER_SITE_OTHER): Payer: 59 | Admitting: Physician Assistant

## 2016-07-05 VITALS — BP 121/77 | HR 79 | Ht 60.0 in | Wt 206.0 lb

## 2016-07-05 DIAGNOSIS — H9311 Tinnitus, right ear: Secondary | ICD-10-CM

## 2016-07-05 DIAGNOSIS — R600 Localized edema: Secondary | ICD-10-CM

## 2016-07-05 DIAGNOSIS — M6289 Other specified disorders of muscle: Secondary | ICD-10-CM

## 2016-07-05 DIAGNOSIS — M542 Cervicalgia: Secondary | ICD-10-CM | POA: Diagnosis not present

## 2016-07-05 DIAGNOSIS — G729 Myopathy, unspecified: Secondary | ICD-10-CM | POA: Diagnosis not present

## 2016-07-05 MED ORDER — METHYLPREDNISOLONE 4 MG PO TBPK: ORAL_TABLET | ORAL | 0 refills | Status: DC

## 2016-07-05 MED ORDER — CYCLOBENZAPRINE HCL 10 MG PO TABS
10.0000 mg | ORAL_TABLET | Freq: Every day | ORAL | 1 refills | Status: DC
Start: 2016-07-05 — End: 2017-01-20

## 2016-07-05 MED ORDER — FUROSEMIDE 20 MG PO TABS: 20.0000 mg | ORAL_TABLET | Freq: Every day | ORAL | 2 refills | Status: DC

## 2016-07-05 NOTE — Patient Instructions (Addendum)
Tinnitus  Tinnitus refers to hearing a sound when there is no actual source for that sound. This is often described as ringing in the ears. However, people with this condition may hear a variety of noises. A person may hear the sound in one ear or in both ears.   The sounds of tinnitus can be soft, loud, or somewhere in between. Tinnitus can last for a few seconds or can be constant for days. It may go away without treatment and come back at various times. When tinnitus is constant or happens often, it can lead to other problems, such as trouble sleeping and trouble concentrating.  Almost everyone experiences tinnitus at some point. Tinnitus that is long-lasting (chronic) or comes back often is a problem that may require medical attention.   CAUSES   The cause of tinnitus is often not known. In some cases, it can result from other problems or conditions, including:   · Exposure to loud noises from machinery, music, or other sources.  · Hearing loss.  · Ear or sinus infections.  · Earwax buildup.  · A foreign object in the ear.  · Use of certain medicines.  · Use of alcohol and caffeine.  · High blood pressure.  · Heart diseases.  · Anemia.  · Allergies.  · Meniere disease.  · Thyroid problems.  · Tumors.  · An enlarged part of a weakened blood vessel (aneurysm).  SYMPTOMS  The main symptom of tinnitus is hearing a sound when there is no source for that sound. It may sound like:   · Buzzing.  · Roaring.  · Ringing.  · Blowing air, similar to the sound heard when you listen to a seashell.  · Hissing.  · Whistling.  · Sizzling.  · Humming.  · Running water.  · A sustained musical note.  DIAGNOSIS   Tinnitus is diagnosed based on your symptoms. Your health care provider will do a physical exam. A comprehensive hearing exam (audiologic exam) will be done if your tinnitus:   · Affects only one ear (unilateral).  · Causes hearing difficulties.  · Lasts 6 months or longer.  You may also need to see a health care provider  who specializes in hearing disorders (audiologist). You may be asked to complete a questionnaire to determine the severity of your tinnitus. Tests may be done to help determine the cause and to rule out other conditions. These can include:  · Imaging studies of your head and brain, such as:    A CT scan.    An MRI.  · An imaging study of your blood vessels (angiogram).  TREATMENT   Treating an underlying medical condition can sometimes make tinnitus go away. If your tinnitus continues, other treatments may include:  · Medicines, such as certain antidepressants or sleeping aids.  · Sound generators to mask the tinnitus. These include:  ¨ Tabletop sound machines that play relaxing sounds to help you fall asleep.  ¨ Wearable devices that fit in your ear and play sounds or music.  ¨ A small device that uses headphones to deliver a signal embedded in music (acoustic neural stimulation). In time, this may change the pathways of your brain and make you less sensitive to tinnitus. This device is used for very severe cases when no other treatment is working.  · Therapy and counseling to help you manage the stress of living with tinnitus.  · Using hearing aids or cochlear implants, if your tinnitus is related to hearing   loss.  HOME CARE INSTRUCTIONS  · When possible, avoid being in loud places and being exposed to loud sounds.  · Wear hearing protection, such as earplugs, when you are exposed to loud noises.  · Do not take stimulants, such as nicotine, alcohol, or caffeine.  · Practice techniques for reducing stress, such as meditation, yoga, or deep breathing.  · Use a white noise machine, a humidifier, or other devices to mask the sound of tinnitus.  · Sleep with your head slightly raised. This may reduce the impact of tinnitus.  · Try to get plenty of rest each night.  SEEK MEDICAL CARE IF:  · You have tinnitus in just one ear.  · Your tinnitus continues for 3 weeks or longer without stopping.  · Home care measures are not  helping.  · You have tinnitus after a head injury.  · You have tinnitus along with any of the following:    Dizziness.    Loss of balance.    Nausea and vomiting.     This information is not intended to replace advice given to you by your health care provider. Make sure you discuss any questions you have with your health care provider.     Document Released: 11/18/2005 Document Revised: 12/09/2014 Document Reviewed: 04/20/2014  Elsevier Interactive Patient Education ©2016 [employer] Inc[employer]

## 2016-07-08 NOTE — Progress Notes (Signed)
   Subjective:    Patient ID: Brittany Byrd, female    DOB: [birth year], 45 y.o.   MRN: [id number]  HPI Pt presents to the clinic with off and on bilateral ankle edema. Better in am and worse as days goes on. Taken her friends lasix which works well. Symptoms ongoing for last month. No changes to diet.   She also has right ear ringing. She has been traveling. She went to urgent care last Friday and they gave her decadron shot which eliminated ringing for 24 hours. It has come back. She also noticed that her right upper back and head are tight and hurt. She has had an off and on headache.    Review of Systems  All other systems reviewed and are negative.      Objective:   Physical Exam  Constitutional: She is oriented to person, place, and time. She appears well-developed and well-nourished.  HENT:  Head: Normocephalic and atraumatic.  Right TM a little dull. [place] able to be viewed.   Cardiovascular: Normal rate, regular rhythm and normal heart sounds.   Pulmonary/Chest: Effort normal and breath sounds normal.  Musculoskeletal:  Tenderness and tightness right sided paraspinous muscles of the upper right back and neck.   Neurological: She is alert and oriented to person, place, and time.  Skin: Skin is dry.  No edema noted.   Psychiatric: She has a normal mood and affect. Her behavior is normal.          Assessment & Plan:  Bilateral leg/ankle swelling- discussed conservative measures. Encouraged to work on weight loss. Lasix given to use as needed.   Right ear tinnitus- unclear etiology. Discussed with patient there are numerous causes of tinnitus and some we never know the cause. she does have a lot of muscle tension on the right side of her upper body. Discussed Naproxen twice a day.flexeril at bedtime.biofreeze.  Massage. Will place order for PT to see if insurance will pay for it. flonase 2 sprays each nostril. Medrol dose pak given today.

## 2016-07-11 ENCOUNTER — Telehealth: Payer: Self-pay | Admitting: *Deleted

## 2016-07-11 NOTE — Telephone Encounter (Signed)
We could give fioricet for just headache.  Have you gotten massage yet?  Have you gotten called about PT?

## 2016-07-11 NOTE — Telephone Encounter (Signed)
Pt left vm stating that the headache she had in the office earlier this week is no better & she still feels really bad.

## 2016-07-16 ENCOUNTER — Ambulatory Visit: Payer: 59 | Attending: Physician Assistant | Admitting: Physical Therapy

## 2016-07-16 DIAGNOSIS — M542 Cervicalgia: Secondary | ICD-10-CM | POA: Diagnosis not present

## 2016-07-16 DIAGNOSIS — M25511 Pain in right shoulder: Secondary | ICD-10-CM | POA: Diagnosis present

## 2016-07-16 NOTE — Therapy (Signed)
[place], [place], [address] Phone: [phone]   Fax:  [phone]  Physical Therapy Evaluation  Patient Details  Name: Brittany Byrd MRN: [id number] Date of Birth: 12-Oct-1971 Referring Provider: Iran Planas, PA  Encounter Date: 07/16/2016      PT End of Session - 07/16/16 1339    Visit Number 1   Number of Visits 12   Date for PT Re-Evaluation 09/15/16   PT Start Time 1230   PT Stop Time 1330   PT Time Calculation (min) 60 min   Activity Tolerance Patient tolerated treatment well   Behavior During Therapy [facility] for tasks assessed/performed      Past Medical History:  Diagnosis Date  . Hypertension   . Migraine   . Seasonal allergies     Past Surgical History:  Procedure Laterality Date  . DILATION AND CURETTAGE OF UTERUS    . NASAL SEPTUM SURGERY      There were no vitals filed for this visit.       Subjective Assessment - 07/16/16 1240    Subjective Pt arriving to therapy today c/o R sided neck pain and pain in her upper trap radiating down her R shoulder. Pt c/o 4/10 pain at rest. Pt reporting she has been under a lot of stress lately and reported tinnitus that has been going on for almost 4 weeks.    Limitations House hold activities   How long can you sit comfortably? 60 minutes   How long can you stand comfortably? 30 minutes   How long can you walk comfortably? 30 minutes   Patient Stated Goals Pt reported she wants to get rid of the ringing in her ears and tightness in her R upper trap. "I just want to get better"   Currently in Pain? Yes   Pain Score 4    Pain Location Neck   Pain Orientation Right   Pain Descriptors / Indicators Aching;Burning   Pain Type Chronic pain   Pain Onset 1 to 4 weeks ago   Pain Frequency Intermittent   Aggravating Factors  stress, working, household activities   Pain Relieving Factors pain meds, ice, heat   Multiple Pain Sites No             [facility] PT Assessment - 07/16/16 0001      Assessment   Medical Diagnosis Cervical Neck Pain, Upper trap pain   Referring Provider Iran Planas, PA   Hand Dominance Right   Prior Therapy none     [place] residence   Type of Home House     Prior Function   Level of Independence Independent     Cognition   Overall Cognitive Status Within Functional Limits for tasks assessed     Coordination   Fine Motor Movements are Fluid and Coordinated Yes   Finger Nose Finger Test Pt able to perform     Posture/Postural Control   Posture/Postural Control Postural limitations   Postural Limitations Rounded Shoulders;Forward head;Increased lumbar lordosis;Increased thoracic kyphosis   Posture Comments R shoulder depression compared to the Left     ROM / Strength   AROM / PROM / Strength AROM  Bil shoulder ROM was WNL     AROM   AROM Assessment Site Cervical   Cervical Flexion 30   Cervical Extension 40   Cervical - Right Side Bend 20   Cervical - Left Side Bend 30   Cervical -  Right Rotation 40   Cervical - Left Rotation 52     Special Tests    Special Tests Cervical   Cervical Tests --  Roos Test: +, C6 Neuro Tension: +     Transfers   Transfers Sit to Stand   Sit to Stand 7: Independent     Ambulation/Gait   Ambulation/Gait Yes   Gait Pattern Step-through pattern;Decreased arm swing - right                           PT Education - 07/16/16 1339    Education provided Yes   Education Details Pt edu in HEP and posture correction with daily activities. 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    Person(s) Educated Patient   Methods Explanation;Demonstration;Tactile cues;Verbal cues;Handout   Comprehension Verbalized understanding;Returned demonstration          PT Short Term Goals - 07/16/16 1356      PT SHORT TERM GOAL #1   Title Pt will be independent in her HEP and progression   Baseline Issued initial HEP  today   Time 3   Period Weeks   Status New     PT SHORT TERM GOAL #2   Title Pt will improve her pain in her R cervical spine to <3/10 with daily activities.    Baseline 4/10 at rest, 7-8/10 with activities   Time 3   Period Weeks   Status New           PT Long Term Goals - 07/16/16 1359      PT LONG TERM GOAL #1   Title Pt will improve her FOTO score to less than or equal to 37% limited to improve overall functional mobility.    Baseline Currently pt is 59% limited   Time 6   Period Weeks   Status New     PT LONG TERM GOAL #2   Title Pt will improve her cervical ROM in R rotation and sidebending by >/= 10 degrees in order to improve functional mobility and ADL's.    Baseline R rotation: 40 degrees, R sidebending: 20 degrees   Time 6   Period Weeks   Status New               Plan - 07/16/16 1340    Clinical Impression Statement Pt presents today to Physical Therapy c/o tinnitus in her ears. Pt also c/o increased stress and pain in her R upper trap and cervical spine. Pt reporting pain radiates at times down her shoulder.  Pt reporting the ringing in her ears has been going on for almost 4 weeks and over the last few days her pain has decreased to 3-4/10 from 7-8/10.    Rehab Potential Good   PT Frequency 2x / week   PT Duration 6 weeks   PT Treatment/Interventions Moist Heat;Traction;Ultrasound;ADLs/Self Care Home Management;Electrical Stimulation;Cryotherapy;Iontophoresis 4mg /ml Dexamethasone;Functional mobility training;Therapeutic activities;Therapeutic exercise;Patient/family education;Passive range of motion;Manual techniques;Dry needling;Taping   PT Next Visit Plan Cervical ROM, upper trap stretching, and posture correction exercises   PT Home Exercise Plan issued wall posture with shoulder glides, cervical retration, cervical rotation   Consulted and Agree with Plan of Care Patient      Patient will benefit from skilled therapeutic intervention in order to  improve the following deficits and impairments:  Postural dysfunction, Decreased activity tolerance, Impaired perceived functional ability, Pain, Impaired UE functional use  Visit Diagnosis: Cervicalgia  Pain in right shoulder  Problem List Patient Active Problem List   Diagnosis Date Noted  . Obesity 03/11/2015  . Epicondylitis elbow, medial 11/04/2014  . Patellofemoral syndrome 11/04/2014  . Asthma, allergic 11/18/2012  . Hypertension   . Seasonal allergies   . Migraine     Oretha Caprice, MPT  07/16/2016, 2:02 PM  [facility] [address] [place], [place], [address] Phone: [phone]   Fax:  [phone]  Name: MONALEE THACKERAY MRN: [id number] Date of Birth: 1971-02-16

## 2016-07-19 ENCOUNTER — Encounter: Payer: Self-pay | Admitting: Physical Therapy

## 2016-07-19 ENCOUNTER — Ambulatory Visit: Payer: 59 | Admitting: Physical Therapy

## 2016-07-19 DIAGNOSIS — M542 Cervicalgia: Secondary | ICD-10-CM

## 2016-07-19 DIAGNOSIS — M25511 Pain in right shoulder: Secondary | ICD-10-CM

## 2016-07-19 NOTE — Therapy (Signed)
[place], [place], [address] Phone: [phone]   Fax:  [phone]  Physical Therapy Treatment  Patient Details  Name: Brittany Byrd MRN: [id number] Date of Birth: 04/01/1971 Referring Provider: Iran Planas, PA  Encounter Date: 07/19/2016      PT End of Session - 07/19/16 0845    Visit Number 2   Number of Visits 12   Date for PT Re-Evaluation 09/15/16   PT Start Time 0845   PT Stop Time 0935   PT Time Calculation (min) 50 min   Activity Tolerance Patient tolerated treatment well   Behavior During Therapy [facility] for tasks assessed/performed      Past Medical History:  Diagnosis Date  . Hypertension   . Migraine   . Seasonal allergies     Past Surgical History:  Procedure Laterality Date  . DILATION AND CURETTAGE OF UTERUS    . NASAL SEPTUM SURGERY      There were no vitals filed for this visit.      Subjective Assessment - 07/19/16 0845    Subjective Pt reports tinnitus has subsided, down to about 75%, cont to ring at night.    Patient Stated Goals Pt reported she wants to get rid of the ringing in her ears and tightness in her R upper trap. "I just want to get better"   Currently in Pain? Yes   Pain Score 4    Pain Location Shoulder   Pain Orientation Right;Posterior   Pain Descriptors / Indicators Sore;Tightness                         [facility] Adult PT Treatment/Exercise - 07/19/16 0001      Exercises   Exercises Neck;Shoulder     Shoulder Exercises: Prone   Extension 20 reps;10 reps   Extension Limitations paired with retraction     Shoulder Exercises: Standing   Row 20 reps;10 reps   Theraband Level (Shoulder Row) Level 2 (Red)     Shoulder Exercises: Pulleys   Flexion 3 minutes     Shoulder Exercises: Stretch   Other Shoulder Stretches open books 5 each     Modalities   Modalities Moist Heat     Moist Heat Therapy   Number Minutes Moist Heat 10  Minutes   Moist Heat Location Cervical     Manual Therapy   Manual Therapy Soft tissue mobilization   Manual therapy comments educated on use of theracane   Soft tissue mobilization trigger point release R upper trap, subscap, suboccipital release     Neck Exercises: Stretches   Upper Trapezius Stretch 2 reps;10 seconds   Levator Stretch 2 reps;10 seconds                PT Education - 07/19/16 0849    Education provided Yes   Education Details exercise form/rationale   Person(s) Educated Patient   Methods Explanation;Demonstration;Tactile cues;Verbal cues   Comprehension Verbalized understanding;Returned demonstration;Verbal cues required;Tactile cues required;Need further instruction          PT Short Term Goals - 07/16/16 1356      PT SHORT TERM GOAL #1   Title Pt will be independent in her HEP and progression   Baseline Issued initial HEP today   Time 3   Period Weeks   Status New     PT SHORT TERM GOAL #2   Title Pt will improve her pain in her R cervical  spine to <3/10 with daily activities.    Baseline 4/10 at rest, 7-8/10 with activities   Time 3   Period Weeks   Status New           PT Long Term Goals - 07/16/16 1359      PT LONG TERM GOAL #1   Title Pt will improve her FOTO score to less than or equal to 37% limited to improve overall functional mobility.    Baseline Currently pt is 59% limited   Time 6   Period Weeks   Status New     PT LONG TERM GOAL #2   Title Pt will improve her cervical ROM in R rotation and sidebending by >/= 10 degrees in order to improve functional mobility and ADL's.    Baseline R rotation: 40 degrees, R sidebending: 20 degrees   Time 6   Period Weeks   Status New               Plan - 07/19/16 [id number]    Clinical Impression Statement Notable tightness in cervical musculature R>L and pt has difficulty dissociating with periscapular musculature for postural support. Verbalized decrease in pain levels  following treatment today and was presented with challenge of upright posture through scapular retraction over the weekend.    PT Next Visit Plan Cervical ROM, upper trap stretching, and posture correction exercises   Consulted and Agree with Plan of Care Patient      Patient will benefit from skilled therapeutic intervention in order to improve the following deficits and impairments:     Visit Diagnosis: Cervicalgia  Pain in right shoulder     Problem List Patient Active Problem List   Diagnosis Date Noted  . Obesity 03/11/2015  . Epicondylitis elbow, medial 11/04/2014  . Patellofemoral syndrome 11/04/2014  . Asthma, allergic 11/18/2012  . Hypertension   . Seasonal allergies   . Migraine     Audri Kozub C. Brittnye Josephs PT, DPT 07/19/16 9:28 AM   [facility] [address] [place], [place], [address] Phone: [phone]   Fax:  [phone]  Name: DAWNELLA HAPKE MRN: [id number] Date of Birth: 1971/10/07

## 2016-07-22 ENCOUNTER — Encounter: Payer: Self-pay | Admitting: Physical Therapy

## 2016-07-22 ENCOUNTER — Ambulatory Visit: Payer: 59 | Admitting: Physical Therapy

## 2016-07-22 DIAGNOSIS — M542 Cervicalgia: Secondary | ICD-10-CM

## 2016-07-22 DIAGNOSIS — M25511 Pain in right shoulder: Secondary | ICD-10-CM

## 2016-07-22 NOTE — Therapy (Signed)
[place], [place], [address] Phone: [phone]   Fax:  [phone]  Physical Therapy Treatment  Patient Details  Name: Brittany Byrd MRN: [id number] Date of Birth: 02-17-1971 Referring Provider: Iran Planas, PA  Encounter Date: 07/22/2016      PT End of Session - 07/22/16 1547    Visit Number 3   Number of Visits 12   Date for PT Re-Evaluation 09/15/16   PT Start Time 1500   PT Stop Time 1545   PT Time Calculation (min) 45 min   Activity Tolerance Patient tolerated treatment well   Behavior During Therapy [facility] for tasks assessed/performed      Past Medical History:  Diagnosis Date  . Hypertension   . Migraine   . Seasonal allergies     Past Surgical History:  Procedure Laterality Date  . DILATION AND CURETTAGE OF UTERUS    . NASAL SEPTUM SURGERY      There were no vitals filed for this visit.      Subjective Assessment - 07/22/16 1537    Subjective Pt reporting periods of 5 hours without tinnitus which is an improvement. Pt reporting the HEP with stretching seems to be helping.    Limitations House hold activities   Currently in Pain? Yes   Pain Score 4    Pain Location Shoulder   Pain Orientation Right   Pain Descriptors / Indicators Sore;Aching   Pain Type Chronic pain   Pain Onset 1 to 4 weeks ago   Pain Frequency Intermittent   Aggravating Factors  stress, working, household activities   Pain Relieving Factors paid meds, heat   Multiple Pain Sites No                         [facility] Adult PT Treatment/Exercise - 07/22/16 0001      Exercises   Exercises Neck;Shoulder     Neck Exercises: Supine   Neck Retraction 5 reps;5 secs     Shoulder Exercises: Prone   Extension 20 reps;10 reps   Extension Limitations paired with retraction     Shoulder Exercises: Standing   Row 20 reps   Theraband Level (Shoulder Row) Level 2 (Red)     Shoulder Exercises:  Pulleys   Flexion 3 minutes     Modalities   Modalities Moist Heat     Moist Heat Therapy   Number Minutes Moist Heat 10 Minutes     Manual Therapy   Manual Therapy Soft tissue mobilization   Manual therapy comments manual traction   Soft tissue mobilization trigger point release R upper trap, subscap, suboccipital release     Neck Exercises: Stretches   Upper Trapezius Stretch 2 reps;10 seconds   Levator Stretch 2 reps;10 seconds                PT Education - 07/22/16 1544    Education Details Discussed Dry Needling and Possible Mechanical Traction   Person(s) Educated Patient   Methods Explanation;Demonstration;Verbal cues   Comprehension Verbalized understanding;Returned demonstration;Verbal cues required;Tactile cues required;Need further instruction          PT Short Term Goals - 07/22/16 1554      PT SHORT TERM GOAL #1   Title Pt will be independent in her HEP and progression   Baseline Issued initial HEP today   Period Weeks   Status On-going     PT SHORT TERM GOAL #2   Title  Pt will improve her pain in her R cervical spine to <3/10 with daily activities.    Baseline 4/10 at rest, 7-8/10 with activities   Time 3   Period Weeks   Status On-going           PT Long Term Goals - 07/16/16 1359      PT LONG TERM GOAL #1   Title Pt will improve her FOTO score to less than or equal to 37% limited to improve overall functional mobility.    Baseline Currently pt is 59% limited   Time 6   Period Weeks   Status New     PT LONG TERM GOAL #2   Title Pt will improve her cervical ROM in R rotation and sidebending by >/= 10 degrees in order to improve functional mobility and ADL's.    Baseline R rotation: 40 degrees, R sidebending: 20 degrees   Time 6   Period Weeks   Status New               Plan - 07/22/16 1550    Clinical Impression Statement Pt with increased tightness in cervical musculature  R>L. Pt also complaining of tinnitus in R ear.  Pt edu in postural control and cervical and neck stretching. Pt reporting decreased pain at end of session from 4 to 2/10. Continue with POC with adding Dry Needling and possible traction in the future.    Rehab Potential Good   PT Frequency 2x / week   PT Duration 6 weeks   PT Treatment/Interventions Moist Heat;Traction;Ultrasound;ADLs/Self Care Home Management;Electrical Stimulation;Cryotherapy;Iontophoresis 4mg /ml Dexamethasone;Functional mobility training;Therapeutic activities;Therapeutic exercise;Patient/family education;Passive range of motion;Manual techniques;Dry needling;Taping   PT Next Visit Plan Cervical ROM, upper trap stretching, and posture correction exercises   PT Home Exercise Plan issued wall posture with shoulder glides, cervical retration, cervical rotation   Consulted and Agree with Plan of Care Patient      Patient will benefit from skilled therapeutic intervention in order to improve the following deficits and impairments:  Postural dysfunction, Decreased activity tolerance, Impaired perceived functional ability, Pain, Impaired UE functional use  Visit Diagnosis: Pain in right shoulder  Cervicalgia     Problem List Patient Active Problem List   Diagnosis Date Noted  . Obesity 03/11/2015  . Epicondylitis elbow, medial 11/04/2014  . Patellofemoral syndrome 11/04/2014  . Asthma, allergic 11/18/2012  . Hypertension   . Seasonal allergies   . Migraine     Oretha Caprice 07/22/2016, 3:56 PM  [facility] [address] [place], [place], [address] Phone: [phone]   Fax:  [phone]  Name: Brittany Byrd MRN: [id number] Date of Birth: 11-09-1971   Kearney Hard, PT 07/22/16 3:56 PM

## 2016-07-23 ENCOUNTER — Ambulatory Visit: Payer: 59 | Admitting: Physical Therapy

## 2016-07-23 DIAGNOSIS — M542 Cervicalgia: Secondary | ICD-10-CM | POA: Diagnosis not present

## 2016-07-23 DIAGNOSIS — M25511 Pain in right shoulder: Secondary | ICD-10-CM

## 2016-07-23 NOTE — Therapy (Signed)
[place], [place], [address] Phone: [phone]   Fax:  [phone]  Physical Therapy Treatment  Patient Details  Name: Brittany Byrd MRN: [id number] Date of Birth: [birth year] Referring Provider: Iran Planas, PA  Encounter Date: 07/23/2016      PT End of Session - 07/23/16 1735    Visit Number 4   Number of Visits 12   Date for PT Re-Evaluation 09/15/16   PT Start Time [id number]   PT Stop Time 1635   PT Time Calculation (min) 50 min   Activity Tolerance Patient tolerated treatment well   Behavior During Therapy [facility] for tasks assessed/performed      Past Medical History:  Diagnosis Date  . Hypertension   . Migraine   . Seasonal allergies     Past Surgical History:  Procedure Laterality Date  . DILATION AND CURETTAGE OF UTERUS    . NASAL SEPTUM SURGERY      There were no vitals filed for this visit.      Subjective Assessment - 07/23/16 1552    Subjective "today the neck is about a 5/10 and I still get some tinnitus, which it may be due to stress"    Currently in Pain? Yes   Pain Score 5    Pain Location Shoulder   Pain Orientation Right   Pain Descriptors / Indicators Aching;Sore   Pain Type Chronic pain   Pain Onset 1 to 4 weeks ago   Pain Frequency Intermittent                         [facility] Adult PT Treatment/Exercise - 07/23/16 1731      Moist Heat Therapy   Number Minutes Moist Heat 10 Minutes   Moist Heat Location Cervical  with pt in supine     Manual Therapy   Manual Therapy Myofascial release;Soft tissue mobilization;Taping   Soft tissue mobilization IASTM over R upper trap, cervical parapsinals and sub-occipitals.    Myofascial Release fascial stretching/ rolling over the R upper trap, R cervical paraspinal, and R sub-occipitals.    McConnell r upper trap inhibition tapin   trial     Neck Exercises: Stretches   Upper Trapezius Stretch 2 reps;20 seconds           Trigger Point Dry Needling - 07/23/16 1554    Consent Given? Yes   Education Handout Provided Yes   Muscles Treated Upper Body Upper trapezius;Suboccipitals muscle group;Longissimus   Upper Trapezius Response Twitch reponse elicited;Palpable increased muscle length  on R x 2    SubOccipitals Response Twitch response elicited;Palpable increased muscle length  x 1   Longissimus Response Twitch response elicited;Palpable increased muscle length  C3, C5 with pistoning/ twisting techniqu on R only              PT Education - 07/23/16 1734    Education Details muscle anatomy in regard to trigger point formation, benefits of dry needling, effects and what to expect and aftercare. instructions on inhibition taping   Person(s) Educated Patient   Methods Explanation;Verbal cues   Comprehension Verbalized understanding;Verbal cues required          PT Short Term Goals - 07/22/16 1554      PT SHORT TERM GOAL #1   Title Pt will be independent in her HEP and progression   Baseline Issued initial HEP today   Period Weeks   Status On-going  PT SHORT TERM GOAL #2   Title Pt will improve her pain in her R cervical spine to <3/10 with daily activities.    Baseline 4/10 at rest, 7-8/10 with activities   Time 3   Period Weeks   Status On-going           PT Long Term Goals - 07/16/16 1359      PT LONG TERM GOAL #1   Title Pt will improve her FOTO score to less than or equal to 37% limited to improve overall functional mobility.    Baseline Currently pt is 59% limited   Time 6   Period Weeks   Status New     PT LONG TERM GOAL #2   Title Pt will improve her cervical ROM in R rotation and sidebending by >/= 10 degrees in order to improve functional mobility and ADL's.    Baseline R rotation: 40 degrees, R sidebending: 20 degrees   Time 6   Period Weeks   Status New               Plan - 07/23/16 1736    Clinical Impression Statement Mrs. Zarazua reports  continued pain and tinnitus prior to treatment. DN was performed on R upper trap, cervical paraspinals at C3 and C5 and R sub-occipitals; pt monitored throughout treatment. Following soft tissue work and upper trap inhibition taping she reported decreased rining in the ears and relief of tension inthe neck. utilized MHP post session for DN soreness.   PT Next Visit Plan assess response to DN, and taping, Cervical ROM, upper trap stretching, and posture correction exercises   Consulted and Agree with Plan of Care Patient      Patient will benefit from skilled therapeutic intervention in order to improve the following deficits and impairments:  Postural dysfunction, Decreased activity tolerance, Impaired perceived functional ability, Pain, Impaired UE functional use  Visit Diagnosis: Pain in right shoulder  Cervicalgia     Problem List Patient Active Problem List   Diagnosis Date Noted  . Obesity 03/11/2015  . Epicondylitis elbow, medial 11/04/2014  . Patellofemoral syndrome 11/04/2014  . Asthma, allergic 11/18/2012  . Hypertension   . Seasonal allergies   . Migraine    Starr Lake PT, DPT, LAT, ATC  07/23/16  5:39 PM      [facility] [address] [place], [place], [address] Phone: [phone]   Fax:  [phone]  Name: KELIA BROWELL MRN: [id number] Date of Birth: [birth year]

## 2016-07-30 ENCOUNTER — Ambulatory Visit: Payer: 59 | Admitting: Physical Therapy

## 2016-07-30 DIAGNOSIS — M542 Cervicalgia: Secondary | ICD-10-CM

## 2016-07-30 DIAGNOSIS — M25511 Pain in right shoulder: Secondary | ICD-10-CM

## 2016-07-30 NOTE — Therapy (Signed)
[place], [place], [address] Phone: [phone]   Fax:  [phone]  Physical Therapy Treatment  Patient Details  Name: Brittany Byrd MRN: [id number] Date of Birth: [birth year] Referring Provider: Iran Planas, PA  Encounter Date: 07/30/2016      PT End of Session - 07/30/16 1020    Visit Number 5   Number of Visits 12   Date for PT Re-Evaluation 09/15/16   PT Start Time 0935   PT Stop Time 1028   PT Time Calculation (min) 53 min   Activity Tolerance Patient tolerated treatment well   Behavior During Therapy [facility] for tasks assessed/performed      Past Medical History:  Diagnosis Date  . Hypertension   . Migraine   . Seasonal allergies     Past Surgical History:  Procedure Laterality Date  . DILATION AND CURETTAGE OF UTERUS    . NASAL SEPTUM SURGERY      There were no vitals filed for this visit.      Subjective Assessment - 07/30/16 0941    Subjective "I can tell a big difference in the R upper trap, little bit better. I still have ringing in the ears that still fluctuates.   Currently in Pain? Yes   Pain Score 4    Pain Location Shoulder   Pain Orientation Right   Pain Descriptors / Indicators Aching;Sore   Pain Type Chronic pain   Pain Onset 1 to 4 weeks ago   Pain Frequency Intermittent   Aggravating Factors  stress, household activities,    Pain Relieving Factors pain meds, heat,                          [facility] Adult PT Treatment/Exercise - 07/30/16 0001      Moist Heat Therapy   Number Minutes Moist Heat 10 Minutes   Moist Heat Location Cervical  R upper trap     Manual Therapy   Soft tissue mobilization IASTM over R upper trap, cervical parapsinals and sub-occipitals.    Myofascial Release fascial stretching/ rolling over the R upper trap, R cervical paraspinal, and R sub-occipitals.      Neck Exercises: Stretches   Upper Trapezius Stretch 2 reps;30  seconds          Trigger Point Dry Needling - 07/30/16 0946    Consent Given? Yes   Education Handout Provided Yes   Muscles Treated Upper Body Upper trapezius;Suboccipitals muscle group;Longissimus;Sternocleidomastoid   Sternocleidomastoid Response Twitch response elicited;Palpable increased muscle length  x 1 with pistoning / twisting techniques   Upper Trapezius Response Twitch reponse elicited;Palpable increased muscle length   Longissimus Response Twitch response elicited;Palpable increased muscle length  at C3-C5 with pistoning techniques              PT Education - 07/30/16 1028    Education provided Yes   Education Details referral of specific muscles that could be contributing to tinnitus.    Person(s) Educated Patient   Methods Explanation;Verbal cues   Comprehension Verbalized understanding          PT Short Term Goals - 07/22/16 1554      PT SHORT TERM GOAL #1   Title Pt will be independent in her HEP and progression   Baseline Issued initial HEP today   Period Weeks   Status On-going     PT SHORT TERM GOAL #2   Title Pt will improve her  pain in her R cervical spine to <3/10 with daily activities.    Baseline 4/10 at rest, 7-8/10 with activities   Time 3   Period Weeks   Status On-going           PT Long Term Goals - 07/30/16 1025      PT LONG TERM GOAL #1   Title Pt will improve her FOTO score to less than or equal to 37% limited to improve overall functional mobility.    Time 6   Period Weeks   Status On-going     PT LONG TERM GOAL #2   Title Pt will improve her cervical ROM in R rotation and sidebending by >/= 10 degrees in order to improve functional mobility and ADL's.    Time 6   Period Weeks   Status On-going               Plan - 07/30/16 1022    Clinical Impression Statement Mrs. Gaunce reported relief of pain and tightness in the R shoulder and inital reduction tinnitus. DN was performed on the R upper trap, C3-C5  multifudus, and SCM, pt monitored throughout treatment. Following treatment soft tissue work was performed and pt rpeorted relief of tightness and significant relief of ringing in the ears but still report minimal tinnitus.    PT Next Visit Plan assess response to DN, Cervical ROM, upper trap stretching, and posture correction exercises   Consulted and Agree with Plan of Care Patient      Patient will benefit from skilled therapeutic intervention in order to improve the following deficits and impairments:  Postural dysfunction, Decreased activity tolerance, Impaired perceived functional ability, Pain, Impaired UE functional use  Visit Diagnosis: Pain in right shoulder  Cervicalgia     Problem List Patient Active Problem List   Diagnosis Date Noted  . Obesity 03/11/2015  . Epicondylitis elbow, medial 11/04/2014  . Patellofemoral syndrome 11/04/2014  . Asthma, allergic 11/18/2012  . Hypertension   . Seasonal allergies   . Migraine    Starr Lake PT, DPT, LAT, ATC  07/30/16  10:29 AM      [facility] [address] [place], [place], [address] Phone: [phone]   Fax:  [phone]  Name: TIMBERLY WEHLING MRN: [id number] Date of Birth: 1971-09-06

## 2016-08-02 ENCOUNTER — Ambulatory Visit: Payer: 59 | Attending: Physician Assistant | Admitting: Physical Therapy

## 2016-08-02 DIAGNOSIS — M542 Cervicalgia: Secondary | ICD-10-CM | POA: Insufficient documentation

## 2016-08-02 DIAGNOSIS — M25511 Pain in right shoulder: Secondary | ICD-10-CM | POA: Diagnosis present

## 2016-08-02 NOTE — Therapy (Signed)
[place], [place], [address] Phone: [phone]   Fax:  [phone]  Physical Therapy Treatment  Patient Details  Name: MALEIGHA COLVARD MRN: [id number] Date of Birth: 1971-08-25 Referring Provider: Iran Planas, PA  Encounter Date: 08/02/2016      PT End of Session - 08/02/16 0853    Visit Number 6   Number of Visits 12   Date for PT Re-Evaluation 09/15/16   PT Start Time 0848   PT Stop Time 0935   PT Time Calculation (min) 47 min      Past Medical History:  Diagnosis Date  . Hypertension   . Migraine   . Seasonal allergies     Past Surgical History:  Procedure Laterality Date  . DILATION AND CURETTAGE OF UTERUS    . NASAL SEPTUM SURGERY      There were no vitals filed for this visit.      Subjective Assessment - 08/02/16 0852    Subjective Its still tight but its better   Currently in Pain? Yes   Pain Score 3    Pain Location Shoulder   Pain Orientation Right            [facility] PT Assessment - 08/02/16 0001      AROM   Cervical Flexion 45   Cervical Extension 45   Cervical - Right Side Bend 35   Cervical - Left Side Bend 35   Cervical - Right Rotation 70   Cervical - Left Rotation 70                     [facility] Adult PT Treatment/Exercise - 08/02/16 0001      Neck Exercises: Theraband   Shoulder Extension 15 reps;Red   Rows 15 reps;Red     Neck Exercises: Seated   Neck Retraction 10 reps     Shoulder Exercises: Supine   Horizontal ABduction 15 reps;Theraband   External Rotation 15 reps;Theraband     Modalities   Modalities Traction     Traction   Type of Traction Cervical   Min (lbs) 5   Max (lbs) 10   Hold Time 60   Rest Time 10   Time 10     Manual Therapy   Manual Therapy Manual Traction   Manual Traction cervical -decreased ringing in ears     Neck Exercises: Stretches   Upper Trapezius Stretch 2 reps;30 seconds   Levator Stretch 2  reps;20 seconds                  PT Short Term Goals - 08/02/16 0912      PT SHORT TERM GOAL #1   Title Pt will be independent in her HEP and progression   Time 3   Period Weeks   Status Achieved     PT SHORT TERM GOAL #2   Title Pt will improve her pain in her R cervical spine to <3/10 with daily activities.    Baseline 3-4/10 at most   Time 3   Period Weeks   Status Partially Met           PT Long Term Goals - 08/02/16 0962      PT LONG TERM GOAL #1   Title Pt will improve her FOTO score to less than or equal to 37% limited to improve overall functional mobility.    Time 6   Period Weeks   Status On-going  PT LONG TERM GOAL #2   Title Pt will improve her cervical ROM in R rotation and sidebending by >/= 10 degrees in order to improve functional mobility and ADL's.    Baseline see objective measures   Time 6   Period Weeks   Status Partially Met               Plan - 08/02/16 0950    Clinical Impression Statement Mrs. Brobeck reports increased tinnitus symptoms she believes may be related to the weather. Her pain levels are 3-4/10 at the most now. Her cervical ROM has improved in all planes. STG# 2, LTG# 2 partially met. Tinnitius sx increased today during cervical stretches and were decreased with manual cervical distraction. Trial of mechanical cervical traction with pt reporting decreased tinnitus symptoms afterward.    PT Next Visit Plan assess response to DN and cervical traction, upper trap stretching and posture correction exercises.    PT Home Exercise Plan  wall posture with shoulder glides, cervical retration, cervical rotation      Patient will benefit from skilled therapeutic intervention in order to improve the following deficits and impairments:  Postural dysfunction, Decreased activity tolerance, Impaired perceived functional ability, Pain, Impaired UE functional use  Visit Diagnosis: Pain in right  shoulder  Cervicalgia     Problem List Patient Active Problem List   Diagnosis Date Noted  . Obesity 03/11/2015  . Epicondylitis elbow, medial 11/04/2014  . Patellofemoral syndrome 11/04/2014  . Asthma, allergic 11/18/2012  . Hypertension   . Seasonal allergies   . Migraine     Dorene Ar, [place] 08/02/2016, 9:58 AM  [facility] [address] [place], [place], [address] Phone: [phone]   Fax:  [phone]  Name: AIRIEL OBLINGER MRN: [id number] Date of Birth: 18-May-1971

## 2016-08-08 ENCOUNTER — Ambulatory Visit: Payer: 59 | Admitting: Physical Therapy

## 2016-08-08 DIAGNOSIS — M25511 Pain in right shoulder: Secondary | ICD-10-CM | POA: Diagnosis not present

## 2016-08-08 DIAGNOSIS — M542 Cervicalgia: Secondary | ICD-10-CM

## 2016-08-08 NOTE — Therapy (Signed)
[place], [place], [address] Phone: [phone]   Fax:  [phone]  Physical Therapy Treatment  Patient Details  Name: Brittany Byrd MRN: [id number] Date of Birth: 02/21/1971 Referring Provider: Iran Planas, PA  Encounter Date: 08/08/2016      PT End of Session - 08/08/16 1235    Visit Number 7   Number of Visits 12   Date for PT Re-Evaluation 09/15/16   PT Start Time 1100   PT Stop Time 1149   PT Time Calculation (min) 49 min   Activity Tolerance Patient tolerated treatment well   Behavior During Therapy [facility] for tasks assessed/performed      Past Medical History:  Diagnosis Date  . Hypertension   . Migraine   . Seasonal allergies     Past Surgical History:  Procedure Laterality Date  . DILATION AND CURETTAGE OF UTERUS    . NASAL SEPTUM SURGERY      There were no vitals filed for this visit.      Subjective Assessment - 08/08/16 1109    Subjective "the traction seemed to help but the ringing in the ears was worse for a couple of days"   Currently in Pain? Yes   Pain Score 4    Pain Location Shoulder   Pain Orientation Right   Pain Descriptors / Indicators Aching;Sore   Pain Type Chronic pain   Pain Onset 1 to 4 weeks ago   Pain Frequency Intermittent   Aggravating Factors  stress, household activities   Pain Relieving Factors pain meds, heat                         [facility] Adult PT Treatment/Exercise - 08/08/16 0001      Moist Heat Therapy   Number Minutes Moist Heat 10 Minutes   Moist Heat Location Cervical     Manual Therapy   Manual Therapy Joint mobilization   Joint Mobilization grade 3 C3-C7 R lateral gapping mobs, P>A mobs    Myofascial Release fascial stretching/ rolling over the R upper trap, R cervical paraspinal, and R sub-occipitals.    Manual Traction gentle manual cervical distraction in supine     Neck Exercises: Stretches   Upper Trapezius  Stretch 2 reps;30 seconds   Levator Stretch 2 reps;20 seconds          Trigger Point Dry Needling - 08/08/16 1232    Consent Given? Yes   Education Handout Provided No   Muscles Treated Upper Body --  masseter and lateral ptyergoid on R                PT Short Term Goals - 08/02/16 0912      PT SHORT TERM GOAL #1   Title Pt will be independent in her HEP and progression   Time 3   Period Weeks   Status Achieved     PT SHORT TERM GOAL #2   Title Pt will improve her pain in her R cervical spine to <3/10 with daily activities.    Baseline 3-4/10 at most   Time 3   Period Weeks   Status Partially Met           PT Long Term Goals - 08/02/16 5638      PT LONG TERM GOAL #1   Title Pt will improve her FOTO score to less than or equal to 37% limited to improve overall functional mobility.  Time 6   Period Weeks   Status On-going     PT LONG TERM GOAL #2   Title Pt will improve her cervical ROM in R rotation and sidebending by >/= 10 degrees in order to improve functional mobility and ADL's.    Baseline see objective measures   Time 6   Period Weeks   Status Partially Met               Plan - 08/08/16 1236    Clinical Impression Statement [name] conitnues to report tinnitus but at a lower level. DN was performed on the R masseter and R ptyergoid, following with cervical mobs. Following manual and TPDN she reported decreased ringing in the ears. continued MHP post session for soreness.   PT Next Visit Plan assess response to DN and cervical traction, upper trap stretching and posture correction exercises. continued mechanical traction   Consulted and Agree with Plan of Care Patient      Patient will benefit from skilled therapeutic intervention in order to improve the following deficits and impairments:     Visit Diagnosis: Pain in right shoulder  Cervicalgia     Problem List Patient Active Problem List   Diagnosis Date Noted  . Obesity  03/11/2015  . Epicondylitis elbow, medial 11/04/2014  . Patellofemoral syndrome 11/04/2014  . Asthma, allergic 11/18/2012  . Hypertension   . Seasonal allergies   . Migraine    Brittany Byrd PT, DPT, LAT, ATC  08/08/16  12:38 PM      [facility] [address] [place], [place], [address] Phone: [phone]   Fax:  [phone]  Name: Brittany Byrd MRN: [id number] Date of Birth: Jan 27, 1971

## 2016-08-13 ENCOUNTER — Ambulatory Visit: Payer: 59 | Admitting: Physical Therapy

## 2016-08-13 NOTE — Therapy (Signed)
[place], [place], [address] Phone: [phone]   Fax:  [phone]  Physical Therapy Treatment  Patient Details  Name: Brittany Byrd MRN: [id number] Date of Birth: 1971/10/16 Referring Provider: Iran Planas, PA  Encounter Date: 08/13/2016    Past Medical History:  Diagnosis Date  . Hypertension   . Migraine   . Seasonal allergies     Past Surgical History:  Procedure Laterality Date  . DILATION AND CURETTAGE OF UTERUS    . NASAL SEPTUM SURGERY      There were no vitals filed for this visit.      Subjective Assessment - 08/13/16 0934    Subjective "                                   PT Short Term Goals - 08/02/16 0912      PT SHORT TERM GOAL #1   Title Pt will be independent in her HEP and progression   Time 3   Period Weeks   Status Achieved     PT SHORT TERM GOAL #2   Title Pt will improve her pain in her R cervical spine to <3/10 with daily activities.    Baseline 3-4/10 at most   Time 3   Period Weeks   Status Partially Met           PT Long Term Goals - 08/02/16 2536      PT LONG TERM GOAL #1   Title Pt will improve her FOTO score to less than or equal to 37% limited to improve overall functional mobility.    Time 6   Period Weeks   Status On-going     PT LONG TERM GOAL #2   Title Pt will improve her cervical ROM in R rotation and sidebending by >/= 10 degrees in order to improve functional mobility and ADL's.    Baseline see objective measures   Time 6   Period Weeks   Status Partially Met               Plan - 08/13/16 0946    Clinical Impression Statement Mrs. Woolbright states she wasn't feeling well today not getting much rest the night before due to severe congestiong which she took medication for. The medication has caused gastric upset, and she was overall not feeling well. she came in to avoid not being charged. sent pt home and  applied her CO-pay to next visit.       Patient will benefit from skilled therapeutic intervention in order to improve the following deficits and impairments:     Visit Diagnosis: Pain in right shoulder  Cervicalgia     Problem List Patient Active Problem List   Diagnosis Date Noted  . Obesity 03/11/2015  . Epicondylitis elbow, medial 11/04/2014  . Patellofemoral syndrome 11/04/2014  . Asthma, allergic 11/18/2012  . Hypertension   . Seasonal allergies   . Migraine    Starr Lake PT, DPT, LAT, ATC  08/13/16  9:48 AM      [facility] [address] [place], [place], [address] Phone: [phone]   Fax:  [phone]  Name: Brittany Byrd MRN: [id number] Date of Birth: July 21, 1971

## 2016-08-15 ENCOUNTER — Ambulatory Visit: Payer: 59 | Admitting: Physical Therapy

## 2016-08-15 DIAGNOSIS — M25511 Pain in right shoulder: Secondary | ICD-10-CM

## 2016-08-15 DIAGNOSIS — M542 Cervicalgia: Secondary | ICD-10-CM

## 2016-08-15 NOTE — Therapy (Signed)
[place], [place], [address] Phone: [phone]   Fax:  [phone]  Physical Therapy Treatment  Patient Details  Name: Brittany Byrd MRN: [id number] Date of Birth: 06-21-1971 Referring Provider: Iran Planas, PA  Encounter Date: 08/15/2016      PT End of Session - 08/15/16 1012    Visit Number 8   Number of Visits 12   Date for PT Re-Evaluation 09/15/16   PT Start Time 0933   PT Stop Time 1021   PT Time Calculation (min) 48 min   Activity Tolerance Patient tolerated treatment well   Behavior During Therapy [facility] for tasks assessed/performed      Past Medical History:  Diagnosis Date  . Hypertension   . Migraine   . Seasonal allergies     Past Surgical History:  Procedure Laterality Date  . DILATION AND CURETTAGE OF UTERUS    . NASAL SEPTUM SURGERY      There were no vitals filed for this visit.      Subjective Assessment - 08/15/16 0934    Subjective "Yesterday I was so excited that I didn't have any ringing the ears, then took tylenol and the ringing came back"    Currently in Pain? Yes   Pain Score 4    Pain Orientation Right   Pain Descriptors / Indicators Aching;Sore   Pain Type Chronic pain   Pain Onset 1 to 4 weeks ago   Pain Frequency Intermittent   Aggravating Factors  stress, household activities   Pain Relieving Factors pain meds, heat                         [facility] Adult PT Treatment/Exercise - 08/15/16 1001      Traction   Type of Traction Cervical   Min (lbs) 8   Max (lbs) 15   Hold Time 60   Rest Time 10   Time 15     Manual Therapy   Manual therapy comments sub-occipital release x 5 min, manual trigger point release x 3 in the R upper trap   Joint Mobilization grade 3 C3-C7 R lateral gapping mobs, P>A mobs    Myofascial Release fascial stretching/ rolling over the R upper trap, R cervical paraspinal, and R sub-occipitals.      Neck Exercises:  Stretches   Upper Trapezius Stretch 2 reps;30 seconds   Levator Stretch 2 reps;30 seconds                  PT Short Term Goals - 08/02/16 0912      PT SHORT TERM GOAL #1   Title Pt will be independent in her HEP and progression   Time 3   Period Weeks   Status Achieved     PT SHORT TERM GOAL #2   Title Pt will improve her pain in her R cervical spine to <3/10 with daily activities.    Baseline 3-4/10 at most   Time 3   Period Weeks   Status Partially Met           PT Long Term Goals - 08/02/16 3154      PT LONG TERM GOAL #1   Title Pt will improve her FOTO score to less than or equal to 37% limited to improve overall functional mobility.    Time 6   Period Weeks   Status On-going     PT LONG TERM GOAL #2  Title Pt will improve her cervical ROM in R rotation and sidebending by >/= 10 degrees in order to improve functional mobility and ADL's.    Baseline see objective measures   Time 6   Period Weeks   Status Partially Met               Plan - 08/15/16 1012    Clinical Impression Statement pt continues to report improvement in the cervical mobility with decreased pain, continued tightness of the upper trap which may be contributed to pt's recent sickness. focused on manual to calm down tightness following by mechanical traction with pt in more cervical flexion which pt reported decreased pain and tinnitus.    PT Next Visit Plan assess response to cervical traction, upper trap stretching and posture correction exercises. continued mechanical traction PRN,    Consulted and Agree with Plan of Care Patient      Patient will benefit from skilled therapeutic intervention in order to improve the following deficits and impairments:  Postural dysfunction, Decreased activity tolerance, Impaired perceived functional ability, Pain, Impaired UE functional use  Visit Diagnosis: Pain in right shoulder  Cervicalgia     Problem List Patient Active Problem  List   Diagnosis Date Noted  . Obesity 03/11/2015  . Epicondylitis elbow, medial 11/04/2014  . Patellofemoral syndrome 11/04/2014  . Asthma, allergic 11/18/2012  . Hypertension   . Seasonal allergies   . Migraine    Brittany Byrd PT, DPT, LAT, ATC  08/15/16  10:16 AM      [facility] [address] [place], [place], [address] Phone: [phone]   Fax:  [phone]  Name: Brittany Byrd MRN: [id number] Date of Birth: 1971-08-12

## 2016-08-21 ENCOUNTER — Ambulatory Visit: Payer: 59 | Admitting: Physical Therapy

## 2016-08-21 DIAGNOSIS — M25511 Pain in right shoulder: Secondary | ICD-10-CM

## 2016-08-21 DIAGNOSIS — M542 Cervicalgia: Secondary | ICD-10-CM

## 2016-08-21 NOTE — Therapy (Signed)
[place], [place], [address] Phone: [phone]   Fax:  [phone]  Physical Therapy Treatment  Patient Details  Name: Brittany Byrd MRN: [id number] Date of Birth: August 24, 1971 Referring Provider: Iran Planas, PA  Encounter Date: 08/21/2016      PT End of Session - 08/21/16 1017    Visit Number 9   Number of Visits 12   Date for PT Re-Evaluation 09/15/16   PT Start Time 0935   PT Stop Time 1023   PT Time Calculation (min) 48 min   Activity Tolerance Patient tolerated treatment well   Behavior During Therapy [facility] for tasks assessed/performed      Past Medical History:  Diagnosis Date  . Hypertension   . Migraine   . Seasonal allergies     Past Surgical History:  Procedure Laterality Date  . DILATION AND CURETTAGE OF UTERUS    . NASAL SEPTUM SURGERY      There were no vitals filed for this visit.      Subjective Assessment - 08/21/16 0938    Subjective "The traction helped last time, the ringing went away last night but it did come back"    Currently in Pain? Yes   Pain Score 4    Pain Location Shoulder   Pain Orientation Right   Pain Descriptors / Indicators Aching;Sore   Pain Type Chronic pain   Pain Onset 1 to 4 weeks ago   Pain Frequency Intermittent                         [facility] Adult PT Treatment/Exercise - 08/21/16 1001      Neck Exercises: Seated   Other Seated Exercise thoracic extension over back of chair 2 x 10  looking down at floor     Neck Exercises: Sidelying   Other Sidelying Exercise book opening 2 x 10 bil     Traction   Type of Traction Cervical   Min (lbs) 8   Max (lbs) 15   Hold Time 60   Rest Time 10   Time 15     Manual Therapy   Manual therapy comments manual trigger point release x 3 in R scalenes, x 4 in upper trap   Joint Mobilization grade 3 C3-C7 R lateral gapping mobs, P>A mobs    Myofascial Release fascial stretching/  rolling over the R upper trap, R cervical paraspinal, and R sub-occipitals.      Neck Exercises: Stretches   Upper Trapezius Stretch 2 reps;30 seconds   Levator Stretch 2 reps;30 seconds                PT Education - 08/21/16 1016    Education provided Yes   Education Details updated and reviewed HEP   Person(s) Educated Patient   Methods Explanation;Verbal cues;Handout   Comprehension Verbalized understanding;Verbal cues required          PT Short Term Goals - 08/02/16 0912      PT SHORT TERM GOAL #1   Title Pt will be independent in her HEP and progression   Time 3   Period Weeks   Status Achieved     PT SHORT TERM GOAL #2   Title Pt will improve her pain in her R cervical spine to <3/10 with daily activities.    Baseline 3-4/10 at most   Time 3   Period Weeks   Status Partially Met  PT Long Term Goals - 08/02/16 0916      PT LONG TERM GOAL #1   Title Pt will improve her FOTO score to less than or equal to 37% limited to improve overall functional mobility.    Time 6   Period Weeks   Status On-going     PT LONG TERM GOAL #2   Title Pt will improve her cervical ROM in R rotation and sidebending by >/= 10 degrees in order to improve functional mobility and ADL's.    Baseline see objective measures   Time 6   Period Weeks   Status Partially Met               Plan - 08/21/16 1017    Clinical Impression Statement pt reports ringing in the ears today that seems to be the same reporting she may speak to her MD about it. focused on manual to calm down tightness in the R scalenes and upper trap. performed throaicc mobility to which she rpeorted decresaed tightness in the neck following exercises. continued cervical traction post session.   PT Next Visit Plan assess response to cervical traction, upper trap stretching and posture correction exercises. continued mechanical traction PRN,    Consulted and Agree with Plan of Care Patient       Patient will benefit from skilled therapeutic intervention in order to improve the following deficits and impairments:  Postural dysfunction, Decreased activity tolerance, Impaired perceived functional ability, Pain, Impaired UE functional use  Visit Diagnosis: Pain in right shoulder  Cervicalgia     Problem List Patient Active Problem List   Diagnosis Date Noted  . Obesity 03/11/2015  . Epicondylitis elbow, medial 11/04/2014  . Patellofemoral syndrome 11/04/2014  . Asthma, allergic 11/18/2012  . Hypertension   . Seasonal allergies   . Migraine    Starr Lake PT, DPT, LAT, ATC  08/21/16  10:30 AM      [facility] [address] [place], [place], [address] Phone: [phone]   Fax:  [phone]  Name: Brittany Byrd MRN: [id number] Date of Birth: [birth year]

## 2016-08-23 ENCOUNTER — Ambulatory Visit: Payer: 59 | Admitting: Physical Therapy

## 2016-08-23 DIAGNOSIS — M542 Cervicalgia: Secondary | ICD-10-CM

## 2016-08-23 DIAGNOSIS — M25511 Pain in right shoulder: Secondary | ICD-10-CM | POA: Diagnosis not present

## 2016-08-23 NOTE — Therapy (Signed)
[place], [place], [address] Phone: [phone]   Fax:  [phone]  Physical Therapy Treatment  Patient Details  Name: SHARMEL BALLANTINE MRN: [id number] Date of Birth: 1971-08-04 Referring Provider: Iran Planas, PA  Encounter Date: 08/23/2016      PT End of Session - 08/23/16 1225    Visit Number 10   Number of Visits 12   Date for PT Re-Evaluation 09/15/16   PT Start Time 0930   PT Stop Time 6503   PT Time Calculation (min) 44 min   Activity Tolerance Patient tolerated treatment well   Behavior During Therapy [facility] for tasks assessed/performed      Past Medical History:  Diagnosis Date  . Hypertension   . Migraine   . Seasonal allergies     Past Surgical History:  Procedure Laterality Date  . DILATION AND CURETTAGE OF UTERUS    . NASAL SEPTUM SURGERY      There were no vitals filed for this visit.      Subjective Assessment - 08/23/16 0940    Subjective "some burning in the back of the head, light ringing in the ears and tightness"    Currently in Pain? Yes   Pain Score 4    Pain Orientation Right   Pain Descriptors / Indicators Aching;Sore   Pain Type Chronic pain   Pain Frequency Intermittent   Aggravating Factors  stress, work   Pain Relieving Factors pain meds, heat                         [facility] Adult PT Treatment/Exercise - 08/23/16 0001      Neck Exercises: Supine   Neck Retraction 10 reps;5 secs     Moist Heat Therapy   Number Minutes Moist Heat --   Moist Heat Location --     Manual Therapy   Joint Mobilization grade 3 C3-C7 R lateral gapping mobs, P>A mobs    Myofascial Release fascial stretching/ rolling over the R upper trap, R cervical paraspinal, and R sub-occipitals.    Manual Traction gentle manual cervical distraction in supine   McConnell r upper trap inhibition tapin      Neck Exercises: Stretches   Upper Trapezius Stretch 2 reps;30 seconds    Levator Stretch 2 reps;30 seconds          Trigger Point Dry Needling - 08/23/16 1000    Consent Given? Yes   Education Handout Provided Yes  given previously   Upper Trapezius Response Twitch reponse elicited;Palpable increased muscle length  x 2 on R with pistoning and twisting techniques   Longissimus Response Palpable increased muscle length;Twitch response elicited  at c3 performed bil x 1 with pistoning                PT Short Term Goals - 08/02/16 0912      PT SHORT TERM GOAL #1   Title Pt will be independent in her HEP and progression   Time 3   Period Weeks   Status Achieved     PT SHORT TERM GOAL #2   Title Pt will improve her pain in her R cervical spine to <3/10 with daily activities.    Baseline 3-4/10 at most   Time 3   Period Weeks   Status Partially Met           PT Long Term Goals - 08/02/16 5465      PT  LONG TERM GOAL #1   Title Pt will improve her FOTO score to less than or equal to 37% limited to improve overall functional mobility.    Time 6   Period Weeks   Status On-going     PT LONG TERM GOAL #2   Title Pt will improve her cervical ROM in R rotation and sidebending by >/= 10 degrees in order to improve functional mobility and ADL's.    Baseline see objective measures   Time 6   Period Weeks   Status Partially Met               Plan - 08/23/16 1226    Clinical Impression Statement pt reported burning in the back of the head prior to treatment today. focused on manual and DN to calm down tightness and muscle inhibition taping. following todays session she reported decreased pain and tightness.    Rehab Potential Good   PT Next Visit Plan assess response to DN, upper trap stretching and posture correction exercises. continued mechanical traction PRN, GOALS, ROM, FOTO   Consulted and Agree with Plan of Care Patient      Patient will benefit from skilled therapeutic intervention in order to improve the following  deficits and impairments:  Postural dysfunction, Decreased activity tolerance, Impaired perceived functional ability, Pain, Impaired UE functional use  Visit Diagnosis: Pain in right shoulder  Cervicalgia     Problem List Patient Active Problem List   Diagnosis Date Noted  . Obesity 03/11/2015  . Epicondylitis elbow, medial 11/04/2014  . Patellofemoral syndrome 11/04/2014  . Asthma, allergic 11/18/2012  . Hypertension   . Seasonal allergies   . Migraine    Starr Lake PT, DPT, LAT, ATC  08/23/16  12:30 PM      [place] [address] [place], [place], [address] Phone: [phone]   Fax:  [phone]  Name: CANDI PROFIT MRN: [id number] Date of Birth: 01/07/1971

## 2016-08-27 ENCOUNTER — Ambulatory Visit: Payer: 59 | Admitting: Physical Therapy

## 2016-08-27 DIAGNOSIS — M25511 Pain in right shoulder: Secondary | ICD-10-CM | POA: Diagnosis not present

## 2016-08-27 DIAGNOSIS — M542 Cervicalgia: Secondary | ICD-10-CM

## 2016-08-27 NOTE — Therapy (Signed)
[place], [place], [address] Phone: [phone]   Fax:  [phone]  Physical Therapy Treatment / Discharge Note  Patient Details  Name: Brittany Byrd MRN: [id number] Date of Birth: [birth year] Referring Provider: Iran Planas, PA  Encounter Date: 08/27/2016      PT End of Session - 08/27/16 1023    Visit Number 11   Number of Visits 12   Date for PT Re-Evaluation 09/15/16   PT Start Time 1017   PT Stop Time 1055   PT Time Calculation (min) 38 min   Activity Tolerance Patient tolerated treatment well   Behavior During Therapy [facility] for tasks assessed/performed      Past Medical History:  Diagnosis Date  . Hypertension   . Migraine   . Seasonal allergies     Past Surgical History:  Procedure Laterality Date  . DILATION AND CURETTAGE OF UTERUS    . NASAL SEPTUM SURGERY      There were no vitals filed for this visit.      Subjective Assessment - 08/27/16 1021    Subjective "things are going well, no ringing in the ears"    Currently in Pain? No/denies            [facility] PT Assessment - 08/27/16 1028      AROM   Cervical Flexion 62   Cervical Extension 58   Cervical - Right Side Bend 40   Cervical - Left Side Bend 36   Cervical - Right Rotation 75   Cervical - Left Rotation 72                     [facility] Adult PT Treatment/Exercise - 08/27/16 0001      Neck Exercises: Seated   Other Seated Exercise thoracic rotation 2 x 10 arm crossed, 1 set hugging bolster   Other Seated Exercise thoracic extension over back of chair 2 x 10     Neck Exercises: Supine   Neck Retraction 5 reps;5 secs     Manual Therapy   Manual therapy comments sub-occipital release      Neck Exercises: Stretches   Upper Trapezius Stretch 2 reps;30 seconds   Levator Stretch 2 reps;30 seconds                PT Education - 08/27/16 1110    Education provided Yes   Education Details  reviewed HEP, educated about self sub-occipital release using tennis balls and provided pt with tennis balls to use at home.   Person(s) Educated Patient   Methods Explanation;Verbal cues   Comprehension Verbalized understanding;Verbal cues required          PT Short Term Goals - 08/27/16 1032      PT SHORT TERM GOAL #1   Title Pt will be independent in her HEP and progression   Time 3   Period Weeks   Status Achieved     PT SHORT TERM GOAL #2   Title Pt will improve her pain in her R cervical spine to <3/10 with daily activities.    Time 3   Period Weeks   Status Achieved           PT Long Term Goals - 08/27/16 1032      PT LONG TERM GOAL #1   Title Pt will improve her FOTO score to less than or equal to 37% limited to improve overall functional mobility.    Baseline 31% limited  Time 6   Period Weeks   Status Achieved     PT LONG TERM GOAL #2   Title Pt will improve her cervical ROM in R rotation and sidebending by >/= 10 degrees in order to improve functional mobility and ADL's.    Time 6   Period Weeks   Status Achieved               Plan - 08/27/16 1112    Clinical Impression Statement Mrs. Nield has made great progress with physical therapy improving cervical mobilty in all planes and additonally reports no pain today. performed sub-occipital release and demonstrated how she can perform it at home using tennis balls. she met all goals and reports she is able to maintain and progress her current level of function independently and will be discharged from PT today.    PT Next Visit Plan discharged from PT   PT Home Exercise Plan manual trigger point release   Consulted and Agree with Plan of Care Patient      Patient will benefit from skilled therapeutic intervention in order to improve the following deficits and impairments:  Postural dysfunction, Decreased activity tolerance, Impaired perceived functional ability, Pain, Impaired UE functional  use  Visit Diagnosis: Pain in right shoulder  Cervicalgia     Problem List Patient Active Problem List   Diagnosis Date Noted  . Obesity 03/11/2015  . Epicondylitis elbow, medial 11/04/2014  . Patellofemoral syndrome 11/04/2014  . Asthma, allergic 11/18/2012  . Hypertension   . Seasonal allergies   . Migraine    Starr Lake PT, DPT, LAT, ATC  08/27/16  12:45 PM      [facility] [address] [place], [place], [address] Phone: [phone]   Fax:  [phone]  Name: Brittany Byrd MRN: [id number] Date of Birth: [birth year]     PHYSICAL THERAPY DISCHARGE SUMMARY  Visits from Start of Care: 11  Current functional level related to goals / functional outcomes: Met all goals   Remaining deficits: Intermittent upper trap tightness relieved with stretching    Education / Equipment: HEP, theraband, posture education Plan: Patient agrees to discharge.  Patient goals were met. Patient is being discharged due to being pleased with the current functional level.  ?????

## 2016-08-28 ENCOUNTER — Ambulatory Visit (INDEPENDENT_AMBULATORY_CARE_PROVIDER_SITE_OTHER): Payer: 59 | Admitting: Physician Assistant

## 2016-08-28 ENCOUNTER — Encounter: Payer: Self-pay | Admitting: Physician Assistant

## 2016-08-28 VITALS — BP 128/84 | HR 111 | Ht 60.0 in | Wt 209.0 lb

## 2016-08-28 DIAGNOSIS — H9311 Tinnitus, right ear: Secondary | ICD-10-CM | POA: Diagnosis not present

## 2016-08-28 DIAGNOSIS — G629 Polyneuropathy, unspecified: Secondary | ICD-10-CM | POA: Insufficient documentation

## 2016-08-28 DIAGNOSIS — Z760 Encounter for issue of repeat prescription: Secondary | ICD-10-CM | POA: Diagnosis not present

## 2016-08-28 MED ORDER — ALPRAZOLAM 0.5 MG PO TABS: 0.5000 mg | ORAL_TABLET | Freq: Every evening | ORAL | 2 refills | Status: DC | PRN

## 2016-08-28 MED ORDER — LINACLOTIDE 290 MCG PO CAPS: 290.0000 ug | ORAL_CAPSULE | Freq: Every day | ORAL | 5 refills | Status: DC

## 2016-08-28 MED ORDER — DICYCLOMINE HCL 20 MG PO TABS: 20.0000 mg | ORAL_TABLET | Freq: Three times a day (TID) | ORAL | 1 refills | Status: DC

## 2016-08-28 MED ORDER — VALACYCLOVIR HCL 1 G PO TABS: 1000.0000 mg | ORAL_TABLET | Freq: Three times a day (TID) | ORAL | 0 refills | Status: DC

## 2016-08-28 NOTE — Progress Notes (Addendum)
Subjective:     Patient ID: Brittany Byrd, female   DOB: 05/23/1971, 45 y.o.   MRN: [id number]  HPI Patient is a 45 y.o. Caucasian female presenting today with complaints of persistent tinnitus for the past 4 months in her right ear. The patient notes that she was previously seen in-office and received a solu-medrol injection that alleviated her symptoms but for 24 hours only. The patient reports trying acupuncture, tylenol pm, and other symptomatic treatments but with little symptomatic relief. The patient also notes a burning sensation around the back of her head and some pain on the right side of her face. She notes that she believes she is going through menopause and has began having some symptoms. The patient denies facial drooping, intense headache, hearing loss, paralysis of the face, numbness, vision disturbances/loss, chest pain, palpitations, or shortness of breath. Pt does have a hx of herpetic infections she denies any rashes today.   Review of Systems  Constitutional: Negative for activity change, appetite change, chills, diaphoresis, fatigue, fever and unexpected weight change.  HENT: Negative.   Eyes: Negative.   Respiratory: Negative for cough, chest tightness, shortness of breath and wheezing.   Cardiovascular: Negative for chest pain, palpitations and leg swelling.  Gastrointestinal: Positive for nausea. Negative for abdominal distention, abdominal pain, constipation, diarrhea and vomiting.  Endocrine: Negative.   Genitourinary: Negative.   Musculoskeletal: Negative.   Skin: Negative.   Allergic/Immunologic: Negative.   Neurological: Negative for dizziness, syncope, weakness, light-headedness, numbness and headaches.  Hematological: Negative.   Psychiatric/Behavioral: Negative.       Objective:   Physical Exam  Constitutional: She is oriented to person, place, and time. She appears well-developed and well-nourished. No distress.  HENT:  Head: Normocephalic and atraumatic.    Right Ear: External ear normal.  Left Ear: External ear normal.  Ears:  Nose: Nose normal.  Mouth/Throat: Oropharynx is clear and moist. No oropharyngeal exudate.  Eyes: Conjunctivae and EOM are normal. Pupils are equal, round, and reactive to light. Right eye exhibits no discharge. Left eye exhibits no discharge. No scleral icterus.  Neck: Normal range of motion. Neck supple. No JVD present. No tracheal deviation present. No thyromegaly present.  Cardiovascular: Normal rate, regular rhythm, normal heart sounds and intact distal pulses.  Exam reveals no gallop and no friction rub.   No murmur heard. Pulmonary/Chest: Effort normal and breath sounds normal. No stridor. No respiratory distress. She has no wheezes. She has no rales. She exhibits no tenderness.  Abdominal: Soft. Bowel sounds are normal.  Lymphadenopathy:    She has no cervical adenopathy.  Neurological: She is alert and oriented to person, place, and time. No cranial nerve deficit. Coordination normal.  Skin: Skin is warm and dry. No rash noted. She is not diaphoretic. No erythema. No pallor.  Psychiatric: She has a normal mood and affect. Her behavior is normal. Judgment and thought content normal.      Assessment:     Diagnoses and all orders for this visit:  Tinnitus of right ear  Neuropathy ([place])  Other orders -     valACYclovir (VALTREX) 1000 MG tablet; Take 1 tablet (1,000 mg total) by mouth 3 (three) times daily. For 7 days. -     ALPRAZolam (XANAX) 0.5 MG tablet; Take 1 tablet (0.5 mg total) by mouth at bedtime as needed for anxiety. -     linaclotide (LINZESS) 290 MCG CAPS capsule; Take 1 capsule (290 mcg total) by mouth daily. -  dicyclomine (BENTYL) 20 MG tablet; Take 1 tablet (20 mg total) by mouth 4 (four) times daily -  before meals and at bedtime.      Plan:     1. Tinnitus/neuropathy- Patient given ambulatory referral for ENT specialist today. 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 Will consult with ENT to determine best imaging and testing for medical management.  Will continue to monitor.  2. Medication refill - Patient given refills on Xanax, Linzess, and Bentyl. Patient currently well managed on medication regimen. Will continue to monitor.

## 2016-08-28 NOTE — Patient Instructions (Signed)
Will schedule for ENT.

## 2016-08-29 ENCOUNTER — Ambulatory Visit: Payer: 59 | Admitting: Physical Therapy

## 2016-08-29 ENCOUNTER — Telehealth: Payer: Self-pay

## 2016-08-29 NOTE — Telephone Encounter (Signed)
Brittany Byrd forgot to mention she also wanted a prednisone pack. She has had this in the past and it did help with the tinnitus.

## 2016-08-30 ENCOUNTER — Other Ambulatory Visit: Payer: Self-pay | Admitting: Physician Assistant

## 2016-08-30 MED ORDER — METHYLPREDNISOLONE 4 MG PO TBPK: ORAL_TABLET | ORAL | 0 refills | Status: DC

## 2016-08-30 NOTE — Telephone Encounter (Signed)
I sent medrol dose pak.

## 2016-09-02 NOTE — Telephone Encounter (Signed)
Left message advising of medication.  

## 2016-09-03 ENCOUNTER — Encounter: Payer: 59 | Admitting: Physical Therapy

## 2016-09-06 ENCOUNTER — Encounter: Payer: 59 | Admitting: Physical Therapy

## 2016-09-10 ENCOUNTER — Telehealth: Payer: Self-pay | Admitting: Family Medicine

## 2016-09-10 ENCOUNTER — Encounter: Payer: 59 | Admitting: Physical Therapy

## 2016-09-10 NOTE — Telephone Encounter (Signed)
Have her symptoms returned?  Prednisone likely alleviated symptoms. Prednisone does come with side effects and long term risk. I can give one more refill if symptoms have presented again.

## 2016-09-10 NOTE — Telephone Encounter (Signed)
Pt requesting refill on Valtrex and/or Prednisone for her tinnitus. States she is out of Rx and it helped her symptoms. Pt has upcoming ENT appt on 09/23/16. Will route to Provider in office.

## 2016-09-11 MED ORDER — METHYLPREDNISOLONE 4 MG PO TBPK: ORAL_TABLET | ORAL | 0 refills | Status: DC

## 2016-09-11 NOTE — Telephone Encounter (Signed)
Yes, symptoms have returned. Presenting the same as they were at last OV.

## 2016-09-11 NOTE — Telephone Encounter (Signed)
Ok to refill prednisone one more time.  Please send refill

## 2016-09-11 NOTE — Telephone Encounter (Signed)
Pt advised of new Rx.

## 2016-09-13 ENCOUNTER — Encounter: Payer: 59 | Admitting: Physical Therapy

## 2016-10-30 ENCOUNTER — Ambulatory Visit (INDEPENDENT_AMBULATORY_CARE_PROVIDER_SITE_OTHER): Payer: 59

## 2016-10-30 ENCOUNTER — Ambulatory Visit (INDEPENDENT_AMBULATORY_CARE_PROVIDER_SITE_OTHER): Payer: 59 | Admitting: Family Medicine

## 2016-10-30 ENCOUNTER — Encounter: Payer: Self-pay | Admitting: Family Medicine

## 2016-10-30 VITALS — BP 117/69 | HR 82 | Temp 98.1°F | Wt 212.0 lb

## 2016-10-30 DIAGNOSIS — R05 Cough: Secondary | ICD-10-CM

## 2016-10-30 DIAGNOSIS — J209 Acute bronchitis, unspecified: Secondary | ICD-10-CM

## 2016-10-30 MED ORDER — ALBUTEROL SULFATE HFA 108 (90 BASE) MCG/ACT IN AERS: 2.0000 | INHALATION_SPRAY | Freq: Four times a day (QID) | RESPIRATORY_TRACT | 0 refills | Status: DC | PRN

## 2016-10-30 MED ORDER — AZITHROMYCIN 250 MG PO TABS: 250.0000 mg | ORAL_TABLET | Freq: Every day | ORAL | 0 refills | Status: DC

## 2016-10-30 NOTE — Patient Instructions (Signed)
Thank you for coming in today. Continue your medicine.  Add azithromycin and albuterol  Call or go to the emergency room if you get worse, have trouble breathing, have chest pains, or palpitations.  Return if not better.    Acute Bronchitis, Adult Acute bronchitis is sudden (acute) swelling of the air tubes (bronchi) in the lungs. Acute bronchitis causes these tubes to fill with mucus, which can make it hard to breathe. It can also cause coughing or wheezing. In adults, acute bronchitis usually goes away within 2 weeks. A cough caused by bronchitis may last up to 3 weeks. Smoking, allergies, and asthma can make the condition worse. Repeated episodes of bronchitis may cause further lung problems, such as chronic obstructive pulmonary disease (COPD). What are the causes? This condition can be caused by germs and by substances that irritate the lungs, including:  Cold and flu viruses. This condition is most often caused by the same virus that causes a cold.  Bacteria.  Exposure to tobacco smoke, dust, fumes, and air pollution. What increases the risk? This condition is more likely to develop in people who:  Have close contact with someone with acute bronchitis.  Are exposed to lung irritants, such as tobacco smoke, dust, fumes, and vapors.  Have a weak immune system.  Have a respiratory condition such as asthma. What are the signs or symptoms? Symptoms of this condition include:  A cough.  Coughing up clear, yellow, or green mucus.  Wheezing.  Chest congestion.  Shortness of breath.  A fever.  Body aches.  Chills.  A sore throat. How is this diagnosed? This condition is usually diagnosed with a physical exam. During the exam, your health care provider may order tests, such as chest X-rays, to rule out other conditions. He or she may also:  Test a sample of your mucus for bacterial infection.  Check the level of oxygen in your blood. This is done to check for  pneumonia.  Do a chest X-ray or lung function testing to rule out pneumonia and other conditions.  Perform blood tests. Your health care provider will also ask about your symptoms and medical history. How is this treated? Most cases of acute bronchitis clear up over time without treatment. Your health care provider may recommend:  Drinking more fluids. Drinking more makes your mucus thinner, which may make it easier to breathe.  Taking a medicine for a fever or cough.  Taking an antibiotic medicine.  Using an inhaler to help improve shortness of breath and to control a cough.  Using a cool mist vaporizer or humidifier to make it easier to breathe. Follow these instructions at home: Medicines  Take over-the-counter and prescription medicines only as told by your health care provider.  If you were prescribed an antibiotic, take it as told by your health care provider. Do not stop taking the antibiotic even if you start to feel better. General instructions  Get plenty of rest.  Drink enough fluids to keep your urine clear or pale yellow.  Avoid smoking and secondhand smoke. Exposure to cigarette smoke or irritating chemicals will make bronchitis worse. If you smoke and you need help quitting, ask your health care provider. Quitting smoking will help your lungs heal faster.  Use an inhaler, cool mist vaporizer, or humidifier as told by your health care provider.  Keep all follow-up visits as told by your health care provider. This is important. How is this prevented? To lower your risk of getting this condition again:  Wash your hands often with soap and water. If soap and water are not available, use hand sanitizer.  Avoid contact with people who have cold symptoms.  Try not to touch your hands to your mouth, nose, or eyes.  Make sure to get the flu shot every year. Contact a health care provider if:  Your symptoms do not improve in 2 weeks of treatment. Get help right  away if:  You cough up blood.  You have chest pain.  You have severe shortness of breath.  You become dehydrated.  You faint or keep feeling like you are going to faint.  You keep vomiting.  You have a severe headache.  Your fever or chills gets worse. This information is not intended to replace advice given to you by your health care provider. Make sure you discuss any questions you have with your health care provider. Document Released: 12/26/2004 Document Revised: 06/12/2016 Document Reviewed: 05/08/2016 Elsevier Interactive Patient Education  2017 [employer].

## 2016-10-30 NOTE — Progress Notes (Signed)
Brittany Byrd is a 45 y.o. female who presents to [facility] today for cough congestion and wheezing. Patient has about a week long history of cough congestion wheezing. She called telemedicine was prescribed prednisone and Keflex in. Additionally she's been using leftover codeine/Phenergan cough syrup which has helped. She notes continued coughing and wheezing and mild shortness of breath. She denies any fevers chills vomiting or diarrhea. She notes an allergy to Singulair.   Past Medical History:  Diagnosis Date  . Hypertension   . Migraine   . Seasonal allergies    Past Surgical History:  Procedure Laterality Date  . DILATION AND CURETTAGE OF UTERUS    . NASAL SEPTUM SURGERY     Social History  Substance Use Topics  . Smoking status: Never Smoker  . Smokeless tobacco: Never Used  . Alcohol use Yes     Comment: rare   family history includes Depression in her mother; Diabetes in her father, maternal grandmother, and paternal grandmother; Hypertension in her father and mother.  ROS as above:  Medications: Current Outpatient Prescriptions  Medication Sig Dispense Refill  . ALPRAZolam (XANAX) 0.5 MG tablet Take 1 tablet (0.5 mg total) by mouth at bedtime as needed for anxiety. 60 tablet 2  . cyclobenzaprine (FLEXERIL) 10 MG tablet Take 1 tablet (10 mg total) by mouth at bedtime. 30 tablet 1  . dicyclomine (BENTYL) 20 MG tablet Take 1 tablet (20 mg total) by mouth 4 (four) times daily -  before meals and at bedtime. 90 tablet 1  . furosemide (LASIX) 20 MG tablet Take 1 tablet (20 mg total) by mouth daily. As needed for lower extremity edema. 30 tablet 2  . JINTELI 1-5 MG-MCG TABS tablet     . linaclotide (LINZESS) 290 MCG CAPS capsule Take 1 capsule (290 mcg total) by mouth daily. 30 capsule 5  . losartan-hydrochlorothiazide (HYZAAR) 50-12.5 MG tablet Take 1  tablet by mouth daily. 90 tablet 4  . methylPREDNISolone (MEDROL DOSEPAK) 4 MG TBPK tablet Take as directed by package insert. 21 tablet 0  . ondansetron (ZOFRAN-ODT) 8 MG disintegrating tablet Take 1 tablet (8 mg total) by mouth every 8 (eight) hours as needed for nausea. 30 tablet 1  . Probiotic Product (PROBIOTIC ADVANCED PO) Take by mouth.    . valACYclovir (VALTREX) 1000 MG tablet Take 1 tablet (1,000 mg total) by mouth 3 (three) times daily. For 7 days. 21 tablet 0   No current facility-administered medications for this visit.    Allergies  Allergen Reactions  . Prednisone Other (See Comments)    "can't sleep, it will keep me up all night but I can tolerate the shot form"  . Singulair [Montelukast Sodium]     depressed    Health Maintenance Health Maintenance  Topic Date Due  . HIV Screening  07/31/1984  . MAMMOGRAM  12/06/2015  . PAP SMEAR  12/05/2017  . TETANUS/TDAP  12/02/2018  . INFLUENZA VACCINE  Addressed     Exam:  BP 117/69   Pulse 82   Temp 98.1 F (36.7 C) (Oral)   Wt 212 lb (96.2 kg)   SpO2 99%   BMI 41.40 kg/m  Gen: Well NAD Nontoxic appearing HEENT: EOMI,  MMM  posterior pharynx with mild cobblestoning. Inflamed nasal turbinates bilaterally. Normal tympanic membranes bilaterally. Lungs: Normal work of breathing. Coarse breath sounds wheezing and prolonged exercise phase present bilaterally. Heart: RRR no MRG Abd: NABS, Soft.  Nondistended, Nontender Exts: Brisk capillary refill, warm and well perfused.   Chest x-ray: Possible infiltrate right lower lung. Large air bubble in the stomach present. Awaiting formal radiology review   No results found for this or any previous visit (from the past 72 hour(s)). No results found.    Assessment and Plan: 45 y.o. female with bronchitis with possible pneumonia. Patient appears to be well. She currently is taking prednisone and Keflex. Plan to add azithromycin to complete antibiotic coverage as well as  albuterol. Continue cough syrup. Recommend patient follow-up with PCP in the near future. Awaiting formal radiology read on chest x-ray.   Orders Placed This Encounter  Procedures  . DG Chest 2 View    Order Specific Question:   Reason for exam:    Answer:   Cough, assess intra-thoracic pathology    Order Specific Question:   Is the patient pregnant?    Answer:   No    Order Specific Question:   Preferred imaging location?    Answer:   [facility]    Discussed warning signs or symptoms. Please see discharge instructions. Patient expresses understanding.

## 2016-11-06 ENCOUNTER — Encounter: Payer: Self-pay | Admitting: Physician Assistant

## 2016-11-07 MED ORDER — FLUCONAZOLE 150 MG PO TABS: 150.0000 mg | ORAL_TABLET | Freq: Once | ORAL | 0 refills | Status: AC

## 2016-11-07 NOTE — Telephone Encounter (Signed)
Spoke with Pt, sent in Rx.

## 2016-11-19 ENCOUNTER — Other Ambulatory Visit: Payer: Self-pay | Admitting: Physician Assistant

## 2016-11-19 DIAGNOSIS — Z760 Encounter for issue of repeat prescription: Secondary | ICD-10-CM

## 2016-11-23 ENCOUNTER — Other Ambulatory Visit: Payer: Self-pay | Admitting: Physician Assistant

## 2016-11-23 DIAGNOSIS — Z760 Encounter for issue of repeat prescription: Secondary | ICD-10-CM

## 2016-11-25 ENCOUNTER — Encounter: Payer: Self-pay | Admitting: Physician Assistant

## 2016-11-26 ENCOUNTER — Other Ambulatory Visit: Payer: Self-pay | Admitting: *Deleted

## 2016-11-26 DIAGNOSIS — Z760 Encounter for issue of repeat prescription: Secondary | ICD-10-CM

## 2016-11-26 MED ORDER — LINACLOTIDE 290 MCG PO CAPS: 290.0000 ug | ORAL_CAPSULE | Freq: Every day | ORAL | 5 refills | Status: DC

## 2016-12-12 ENCOUNTER — Other Ambulatory Visit: Payer: Self-pay | Admitting: *Deleted

## 2016-12-12 MED ORDER — LOSARTAN POTASSIUM-HCTZ 50-12.5 MG PO TABS: 1.0000 | ORAL_TABLET | Freq: Every day | ORAL | 4 refills | Status: DC

## 2017-01-08 ENCOUNTER — Encounter: Payer: Self-pay | Admitting: Physician Assistant

## 2017-01-20 ENCOUNTER — Encounter: Payer: Self-pay | Admitting: Obstetrics & Gynecology

## 2017-01-20 ENCOUNTER — Ambulatory Visit (INDEPENDENT_AMBULATORY_CARE_PROVIDER_SITE_OTHER): Payer: 59 | Admitting: Obstetrics & Gynecology

## 2017-01-20 VITALS — BP 127/77 | HR 86 | Ht 60.0 in | Wt 200.0 lb

## 2017-01-20 DIAGNOSIS — Z0000 Encounter for general adult medical examination without abnormal findings: Secondary | ICD-10-CM | POA: Diagnosis not present

## 2017-01-20 DIAGNOSIS — Z01419 Encounter for gynecological examination (general) (routine) without abnormal findings: Secondary | ICD-10-CM | POA: Diagnosis not present

## 2017-01-20 DIAGNOSIS — F3281 Premenstrual dysphoric disorder: Secondary | ICD-10-CM

## 2017-01-20 MED ORDER — CIPROFLOXACIN HCL 500 MG PO TABS: 500.0000 mg | ORAL_TABLET | Freq: Two times a day (BID) | ORAL | 0 refills | Status: DC

## 2017-01-20 MED ORDER — PROMETHAZINE HCL 25 MG PO TABS: 25.0000 mg | ORAL_TABLET | Freq: Four times a day (QID) | ORAL | 0 refills | Status: DC | PRN

## 2017-01-20 NOTE — Progress Notes (Signed)
y

## 2017-01-21 DIAGNOSIS — F3281 Premenstrual dysphoric disorder: Secondary | ICD-10-CM | POA: Insufficient documentation

## 2017-01-21 NOTE — Progress Notes (Signed)
Subjective:     Brittany Byrd is a 46 y.o. female here for a routine exam.  Current complaints: mental irritability the week before her menses .     Gynecologic History Patient's last menstrual period was 01/13/2017. Contraception: none Last Pap: 2016. Results were: normal per pt Last mammogram: >1 year ago.  nml per pt.  Obstetric History OB History  Gravida Para Term Preterm AB Living  0 0 0 0 0 0  SAB TAB Ectopic Multiple Live Births  0 0 0 0 0       The following portions of the patient's history were reviewed and updated as appropriate: allergies, current medications, past family history, past medical history, past social history, past surgical history and problem list.  Review of Systems Pertinent items noted in HPI and remainder of comprehensive ROS otherwise negative.    Objective:      Vitals:   01/20/17 1500  BP: 127/77  Pulse: 86  Weight: 200 lb (90.7 kg)  Height: 5' (1.524 m)   Vitals:  WNL General appearance: alert, cooperative and no distress  HEENT: Normocephalic, without obvious abnormality, atraumatic Eyes: negative Throat: lips, mucosa, and tongue normal; teeth and gums normal  Respiratory: Clear to auscultation bilaterally  CV: Regular rate and rhythm  Breasts:  Normal appearance, no masses or tenderness, no nipple retraction or dimpling  GI: Soft, non-tender; bowel sounds normal; no masses,  no organomegaly  GU: External Genitalia:  Tanner V, no lesion Urethra:  No prolapse   Vagina: Pink, normal rugae, no blood or discharge  Cervix: No CMT, no lesion, very difficult exam due to patient compliance--suggest xanax prior to next speculum exam.  Uterus:  Normal size and contour, non tender; limited due to habitus  Adnexa: Normal, no masses, non tender, limited due to habitus  Musculoskeletal: No edema, redness or tenderness in the calves or thighs  Skin: No lesions or rash  Lymphatic: Axillary adenopathy: none     Psychiatric: Normal mood and  behavior        Assessment:    Healthy female exam.   Anxiety Upcoming international travel   Plan:   Pap in 2019 (needs Xanax prior to speculum exam. Mammogram Phenergan and Cipro Rx given for upcoming travel. Pt to call if she wants treatment for PMDD

## 2017-02-04 ENCOUNTER — Ambulatory Visit
Admission: RE | Admit: 2017-02-04 | Discharge: 2017-02-04 | Disposition: A | Discharge status: Home / Self Care | Payer: 59 | Source: Ambulatory Visit | Attending: Obstetrics & Gynecology | Admitting: Obstetrics & Gynecology

## 2017-02-04 DIAGNOSIS — Z01419 Encounter for gynecological examination (general) (routine) without abnormal findings: Secondary | ICD-10-CM

## 2017-03-11 ENCOUNTER — Encounter: Payer: Self-pay | Admitting: Physician Assistant

## 2017-04-18 ENCOUNTER — Encounter: Payer: Self-pay | Admitting: Physician Assistant

## 2017-04-18 ENCOUNTER — Ambulatory Visit (INDEPENDENT_AMBULATORY_CARE_PROVIDER_SITE_OTHER): Payer: 59 | Admitting: Physician Assistant

## 2017-04-18 VITALS — BP 130/66 | HR 84 | Ht 60.0 in | Wt 180.0 lb

## 2017-04-18 DIAGNOSIS — R011 Cardiac murmur, unspecified: Secondary | ICD-10-CM | POA: Diagnosis not present

## 2017-04-18 DIAGNOSIS — K5900 Constipation, unspecified: Secondary | ICD-10-CM | POA: Diagnosis not present

## 2017-04-18 DIAGNOSIS — R600 Localized edema: Secondary | ICD-10-CM | POA: Diagnosis not present

## 2017-04-18 DIAGNOSIS — Z6835 Body mass index (BMI) 35.0-35.9, adult: Secondary | ICD-10-CM

## 2017-04-18 DIAGNOSIS — Z0000 Encounter for general adult medical examination without abnormal findings: Secondary | ICD-10-CM

## 2017-04-18 MED ORDER — FUROSEMIDE 20 MG PO TABS: 20.0000 mg | ORAL_TABLET | Freq: Every day | ORAL | 1 refills | Status: DC

## 2017-04-18 NOTE — Progress Notes (Signed)
Subjective:     Brittany Byrd is a 46 y.o. female and is here for a comprehensive physical exam. The patient reports no problems.  Pt has actively been losing weight. She feels so much better since 32lbs is gone. She has been using the servies of [facility] MD. She is on a 1200 calorie diet. Her A1C is down from 6.4 to 5.8. She is exercising with trainer twice a week.    Social History   Social History  . Marital status: Married    Spouse name: N/A  . Number of children: N/A  . Years of education: N/A   Occupational History  . Not on file.   Social History Main Topics  . Smoking status: Never Smoker  . Smokeless tobacco: Never Used  . Alcohol use Yes     Comment: rare  . Drug use: No  . Sexual activity: Yes    Birth control/ protection: None   Other Topics Concern  . Not on file   Social History Narrative  . No narrative on file   Health Maintenance  Topic Date Due  . HIV Screening  07/31/1984  . INFLUENZA VACCINE  07/02/2017  . PAP SMEAR  01/06/2018  . MAMMOGRAM  02/04/2018  . TETANUS/TDAP  12/02/2018    The following portions of the patient's history were reviewed and updated as appropriate: allergies, current medications, past family history, past medical history, past social history, past surgical history and problem list.  Review of Systems Pertinent items noted in HPI and remainder of comprehensive ROS otherwise negative.   Objective:    BP 130/66   Pulse 84   Ht 5' (1.524 m)   Wt 180 lb (81.6 kg)   BMI 35.15 kg/m  General appearance: alert, cooperative and appears stated age Head: Normocephalic, without obvious abnormality, atraumatic Eyes: conjunctivae/corneas clear. PERRL, EOM's intact. Fundi benign. Ears: normal TM's and external ear canals both ears Nose: Nares normal. Septum midline. Mucosa normal. No drainage or sinus tenderness. Throat: lips, mucosa, and tongue normal; teeth and gums normal Neck: no adenopathy, no carotid bruit, no JVD, supple,  symmetrical, trachea midline and thyroid not enlarged, symmetric, no tenderness/mass/nodules Back: symmetric, no curvature. ROM normal. No CVA tenderness. Lungs: clear to auscultation bilaterally Heart: NSR, systolic murmur 2/6. Abdomen: soft, non-tender; bowel sounds normal; no masses,  no organomegaly Extremities: extremities normal, atraumatic, no cyanosis or edema Pulses: 2+ and symmetric Skin: Skin color, texture, turgor normal. No rashes or lesions Lymph nodes: Cervical, supraclavicular, and axillary nodes normal. Neurologic: Alert and oriented X 3, normal strength and tone. Normal symmetric reflexes. Normal coordination and gait    Assessment:    Healthy female exam.      Plan:    [name] KitchenMarland KitchenDeanna was seen today for annual exam.  Diagnoses and all orders for this visit:  Routine physical examination  Constipation, unspecified constipation type  BMI 35.0-35.9,adult  Lower extremity edema -     furosemide (LASIX) 20 MG tablet; Take 1 tablet (20 mg total) by mouth daily.  .. Depression screen [facility] 2/9 04/18/2017  Decreased Interest 0  Down, Depressed, Hopeless 0  PHQ - 2 Score 0    Pt will get [place] labs to scan into EMR. Per patient all good and/or improving.   Consider compression stocking and low salt diet for edema. Pt does not take lasix daily but only as needed.   Continue to work on weight loss with diet and exercise.  Continue on vitamin D and calcium.  Vaccines up to date.  Pap smear up to date.  Vaccines up to date.  Mammogram up to date.  See After Visit Summary for Counseling Recommendations

## 2017-04-18 NOTE — Patient Instructions (Signed)
Start a baby ASA a day.   Keeping You Healthy  Get These Tests 1. Blood Pressure- Have your blood pressure checked once a year by your health care provider.  Normal blood pressure is 120/80. 2. Weight- Have your body mass index (BMI) calculated to screen for obesity.  BMI is measure of body fat based on height and weight.  You can also calculate your own BMI at GravelBags.it. 3. Cholesterol- Have your cholesterol checked every 5 years starting at age 48 then yearly starting at age 61. 81. Chlamydia, HIV, and other sexually transmitted diseases- Get screened every year until age 48, then within three months of each new sexual provider. 5. Pap Test - Every 1-5 years; discuss with your health care provider. 6. Mammogram- Every 1-2 years starting at age 41--50  Take these medicines  Calcium with Vitamin D-Your body needs 1200 mg of Calcium each day and [phone] IU of Vitamin D daily.  Your body can only absorb 500 mg of Calcium at a time so Calcium must be taken in 2 or 3 divided doses throughout the day.  Multivitamin with folic acid- Once daily if it is possible for you to become pregnant.  Get these Immunizations  Gardasil-Series of three doses; prevents HPV related illness such as genital warts and cervical cancer.  Menactra-Single dose; prevents meningitis.  Tetanus shot- Every 10 years.  Flu shot-Every year.  Take these steps 1. Do not smoke-Your healthcare provider can help you quit.  For tips on how to quit go to www.smokefree.gov or call 1-800 QUITNOW. 2. Be physically active- Exercise 5 days a week for at least 30 minutes.  If you are not already physically active, start slow and gradually work up to 30 minutes of moderate physical activity.  Examples of moderate activity include walking briskly, dancing, swimming, bicycling, etc. 3. Breast Cancer- A self breast exam every month is important for early detection of breast cancer.  For more information and instruction on  self breast exams, ask your healthcare provider or https://www.patel.info/. 4. Eat a healthy diet- Eat a variety of healthy foods such as fruits, vegetables, whole grains, low fat milk, low fat cheeses, yogurt, lean meats, poultry and fish, beans, nuts, tofu, etc.  For more information go to www. Thenutritionsource.org 5. Drink alcohol in moderation- Limit alcohol intake to one drink or less per day. Never drink and drive. 6. Depression- Your emotional health is as important as your physical health.  If you're feeling down or losing interest in things you normally enjoy please talk to your healthcare provider about being screened for depression. 7. Dental visit- Brush and floss your teeth twice daily; visit your dentist twice a year. 8. Eye doctor- Get an eye exam at least every 2 years. 9. Helmet use- Always wear a helmet when riding a bicycle, motorcycle, rollerblading or skateboarding. 31. Safe sex- If you may be exposed to sexually transmitted infections, use a condom. 11. Seat belts- Seat belts can save your live; always wear one. 12. Smoke/Carbon Monoxide detectors- These detectors need to be installed on the appropriate level of your home. Replace batteries at least once a year. 13. Skin cancer- When out in the sun please cover up and use sunscreen 15 SPF or higher. 14. Violence- If anyone is threatening or hurting you, please tell your healthcare provider.

## 2017-04-20 DIAGNOSIS — Z6835 Body mass index (BMI) 35.0-35.9, adult: Secondary | ICD-10-CM | POA: Insufficient documentation

## 2017-04-20 DIAGNOSIS — R011 Cardiac murmur, unspecified: Secondary | ICD-10-CM | POA: Insufficient documentation

## 2017-09-18 ENCOUNTER — Ambulatory Visit (INDEPENDENT_AMBULATORY_CARE_PROVIDER_SITE_OTHER): Payer: 59 | Admitting: Physician Assistant

## 2017-09-18 VITALS — BP 138/82 | HR 67 | Ht 60.0 in | Wt 167.0 lb

## 2017-09-18 DIAGNOSIS — Z23 Encounter for immunization: Secondary | ICD-10-CM

## 2017-09-18 NOTE — Progress Notes (Signed)
Pt given Pnuemovax 23 in LD without complication.

## 2017-11-23 ENCOUNTER — Emergency Department (INDEPENDENT_AMBULATORY_CARE_PROVIDER_SITE_OTHER)
Admission: EM | Admit: 2017-11-23 | Discharge: 2017-11-23 | Disposition: A | Discharge status: Home / Self Care | Payer: 59 | Source: Home / Self Care | Attending: Emergency Medicine | Admitting: Emergency Medicine

## 2017-11-23 ENCOUNTER — Other Ambulatory Visit: Payer: Self-pay

## 2017-11-23 ENCOUNTER — Encounter: Payer: Self-pay | Admitting: Emergency Medicine

## 2017-11-23 DIAGNOSIS — H5711 Ocular pain, right eye: Secondary | ICD-10-CM

## 2017-11-23 DIAGNOSIS — H5789 Other specified disorders of eye and adnexa: Secondary | ICD-10-CM

## 2017-11-23 MED ORDER — OFLOXACIN 0.3 % OP SOLN: 1.0000 [drp] | Freq: Four times a day (QID) | OPHTHALMIC | 0 refills | Status: DC

## 2017-11-23 NOTE — Discharge Instructions (Signed)
Your blood pressure is elevated. Please be sure you follow-up with your primary care physician. I have given you a prescription to use for years irritated right eye.

## 2017-11-23 NOTE — ED Triage Notes (Signed)
Patient c/o Right Eye Pain, states she has mucous coming from right eye, denies in blurred vision.

## 2017-11-23 NOTE — ED Provider Notes (Signed)
[facility]    CSN: [id number] Arrival date & time: 11/23/17  1202     History   Chief Complaint Chief Complaint  Patient presents with  . Eye Pain    HPI Brittany Byrd is a 46 y.o. female.  Patient enters with onset this morning of right eye pain. She has had no distortion in her vision. She is concerned about an area medial scleral conjunctiva which appears swollen to her. She is concerned that possibly she rubbed that portion of her eye on her husband's CPAP equipment. HPI  Past Medical History:  Diagnosis Date  . Hypertension   . Migraine   . Seasonal allergies   . Uterine cancer [facility])     Patient Active Problem List   Diagnosis Date Noted  . BMI 35.0-35.9,adult 04/20/2017  . Heart murmur 04/20/2017  . Constipation 04/18/2017  . PMDD (premenstrual dysphoric disorder) 01/21/2017  . Neuropathy 08/28/2016  . Tinnitus of right ear 08/28/2016  . Obesity 03/11/2015  . Epicondylitis elbow, medial 11/04/2014  . Patellofemoral syndrome 11/04/2014  . Asthma, allergic 11/18/2012  . Hypertension   . Seasonal allergies   . Migraine     Past Surgical History:  Procedure Laterality Date  . DILATION AND CURETTAGE OF UTERUS    . NASAL SEPTUM SURGERY      OB History    Gravida Para Term Preterm AB Living   0 0 0 0 0 0   SAB TAB Ectopic Multiple Live Births   0 0 0 0 0       Home Medications    Prior to Admission medications   Medication Sig Start Date End Date Taking? Authorizing Provider  Cholecalciferol (VITAMIN D PO) Take 10,000 Int'l Units by mouth.   Yes [provider]  MAGNESIUM PO Take by mouth.   Yes [provider]  POTASSIUM PO Take by mouth.   Yes [provider]  ofloxacin (OCUFLOX) 0.3 % ophthalmic solution Place 1 drop into the right eye 4 (four) times daily. 11/23/17   Darlyne Russian, MD  Probiotic Product (PROBIOTIC ADVANCED PO) Take by mouth.    [provider]    Family History Family History   Problem Relation Age of Onset  . Depression Mother   . Hypertension Mother   . Diabetes Father   . Hypertension Father   . Diabetes Maternal Grandmother   . Diabetes Paternal Grandmother   . Breast cancer Maternal Aunt     Social History Social History   Tobacco Use  . Smoking status: Never Smoker  . Smokeless tobacco: Never Used  Substance Use Topics  . Alcohol use: Yes    Comment: rare  . Drug use: No     Allergies   Prednisone and Singulair [montelukast sodium]   Review of Systems Review of Systems  Constitutional: Negative.   HENT: Negative.   Eyes: Positive for pain and redness. Negative for photophobia, discharge, itching and visual disturbance.  Respiratory: Negative.      Physical Exam Triage Vital Signs ED Triage Vitals  Enc Vitals Group     BP 11/23/17 1230 (!) 169/94     Pulse Rate 11/23/17 1230 83     Resp --      Temp 11/23/17 1230 97.9 F (36.6 C)     Temp Source 11/23/17 1230 Oral     SpO2 --      Weight 11/23/17 1231 170 lb (77.1 kg)     Height 11/23/17 1231 5' (1.524  m)     Head Circumference --      Peak Flow --      Pain Score 11/23/17 1232 4     Pain Loc --      Pain Edu? --      Excl. in [place]? --    No data found.  Updated Vital Signs BP (!) 169/94 (BP Location: Left Arm) Comment: 142/91  Pulse 83   Temp 97.9 F (36.6 C) (Oral)   Ht 5' (1.524 m)   Wt 170 lb (77.1 kg)   BMI 33.20 kg/m   Visual Acuity Right Eye Distance: 20/30 Left Eye Distance: 20/25 Bilateral Distance: with correction  Right Eye Near:   Left Eye Near:    Bilateral Near:     Physical Exam  Constitutional: She appears well-developed and well-nourished.  Eyes: EOM are normal. Pupils are equal, round, and reactive to light.  There is mild redness of the medial scleral conjunctiva of the right eye. There is no discharge noted. After instillation of 2 drops of tetracaine floor seen staining was performed and no corneal abrasion was identified.     UC  Treatments / Results  Labs (all labs ordered are listed, but only abnormal results are displayed) Labs Reviewed - No data to display  EKG  EKG Interpretation None       Radiology No results found.  Procedures Procedures (including critical care time)  Medications Ordered in UC Medications - No data to display   Initial Impression / Assessment and Plan / UC Course  I have reviewed the triage vital signs and the nursing notes.  Pertinent labs & imaging results that were available during my care of the patient were reviewed by me and considered in my medical decision making (see chart for details).  Clinical Course as of Nov 23 1350  Sun Nov 23, 2017  1348 BP: (!) 169/94 [SD]  1349 BP: (!) 169/94 [SD]  1349 BP: (!) 169/94 [SD]  1349 BP: (!) 169/94 [SD]  1349 BP: (!) 169/94 [SD]    Clinical Course User Index [SD] Darlyne Russian, MD  Patient has an area of inflammation of the medial lower sclera of the right eye. We'll cover with antibiotic drops. This appears to be traumatic in origin. Blood pressure was borderline elevated and she will follow this up with her PCP.    Final Clinical Impressions(s) / UC Diagnoses   Final diagnoses:  Eye irritation  Pain of right eye    ED Discharge Orders        Ordered    ofloxacin (OCUFLOX) 0.3 % ophthalmic solution  4 times daily     11/23/17 1351       Controlled Substance Prescriptions [place] Controlled Substance Registry consulted? Not Applicable   Darlyne Russian, MD 11/23/17 [phone]

## 2017-11-26 ENCOUNTER — Other Ambulatory Visit: Payer: Self-pay | Admitting: Physician Assistant

## 2017-12-26 ENCOUNTER — Encounter: Payer: Self-pay | Admitting: Physician Assistant

## 2017-12-29 MED ORDER — LINACLOTIDE 290 MCG PO CAPS: 290.0000 ug | ORAL_CAPSULE | Freq: Every day | ORAL | 3 refills | Status: DC

## 2017-12-29 MED ORDER — ALPRAZOLAM 0.25 MG PO TABS: 0.2500 mg | ORAL_TABLET | Freq: Two times a day (BID) | ORAL | 1 refills | Status: DC | PRN

## 2018-01-12 ENCOUNTER — Encounter: Payer: Self-pay | Admitting: Physician Assistant

## 2018-02-25 ENCOUNTER — Encounter: Payer: Self-pay | Admitting: Obstetrics & Gynecology

## 2018-02-25 ENCOUNTER — Ambulatory Visit (INDEPENDENT_AMBULATORY_CARE_PROVIDER_SITE_OTHER): Payer: 59 | Admitting: Obstetrics & Gynecology

## 2018-02-25 VITALS — BP 135/86 | HR 83 | Resp 16 | Ht 60.0 in | Wt 175.0 lb

## 2018-02-25 DIAGNOSIS — Z1151 Encounter for screening for human papillomavirus (HPV): Secondary | ICD-10-CM

## 2018-02-25 DIAGNOSIS — Z124 Encounter for screening for malignant neoplasm of cervix: Secondary | ICD-10-CM | POA: Diagnosis not present

## 2018-02-25 DIAGNOSIS — Z01419 Encounter for gynecological examination (general) (routine) without abnormal findings: Secondary | ICD-10-CM

## 2018-02-25 DIAGNOSIS — Z01411 Encounter for gynecological examination (general) (routine) with abnormal findings: Secondary | ICD-10-CM

## 2018-02-25 DIAGNOSIS — Z139 Encounter for screening, unspecified: Secondary | ICD-10-CM

## 2018-02-25 MED ORDER — SERTRALINE HCL 25 MG PO TABS
25.0000 mg | ORAL_TABLET | Freq: Every day | ORAL | 6 refills | Status: DC
Start: 2018-02-25 — End: 2018-03-17

## 2018-02-25 NOTE — Progress Notes (Signed)
Subjective:     Brittany Byrd is a 47 y.o. female here for a routine exam.  Current complaints: Continued PMDD--ready to try meds; wants Zoloft over prozac.  Pt aware trials done with prozac (pt had anorgasmia with prozac).   Gynecologic History Patient's last menstrual period was 02/11/2018. Contraception: none Last Pap: 2016. Results were: normal Last mammogram: 2018. Results were: normal  Obstetric History OB History  Gravida Para Term Preterm AB Living  0 0 0 0 0 0  SAB TAB Ectopic Multiple Live Births  0 0 0 0 0     The following portions of the patient's history were reviewed and updated as appropriate: allergies, current medications, past family history, past medical history, past social history, past surgical history and problem list.  Review of Systems Pertinent items noted in HPI and remainder of comprehensive ROS otherwise negative.    Objective:      Vitals:   02/25/18 0930  BP: 135/86  Pulse: 83  Resp: 16  Weight: 175 lb (79.4 kg)  Height: 5' (1.524 m)   Vitals:  WNL General appearance: alert, cooperative and no distress  HEENT: Normocephalic, without obvious abnormality, atraumatic Eyes: negative Throat: lips, mucosa, and tongue normal; teeth and gums normal  Respiratory: Clear to auscultation bilaterally  CV: Regular rate and rhythm  Breasts:  Normal appearance, no masses or tenderness, no nipple retraction or dimpling  GI: Soft, non-tender; bowel sounds normal; no masses,  no organomegaly  GU: External Genitalia:  Tanner V, no lesion Urethra:  No prolapse   Vagina: Pale pink, normal rugae, no blood or discharge  Cervix: No CMT, no lesion  Uterus:  Normal size and contour, non tender  Adnexa: Normal, no masses, non tender  Musculoskeletal: No edema, redness or tenderness in the calves or thighs  Skin: No lesions or rash  Lymphatic: Axillary adenopathy: none     Psychiatric: Normal mood and behavior    Assessment:    Healthy female exam.    PMDD perimenopausal   Plan:    Pap smear with co-testing Mammogram Zoloft 25 mg for the 2 weeks prior to her menstruation.  This will be treating PMDD. Check FSH today

## 2018-02-26 LAB — CYTOLOGY - PAP
DIAGNOSIS: NEGATIVE
HPV (WINDOPATH): NOT DETECTED

## 2018-02-26 LAB — FOLLICLE STIMULATING HORMONE: FSH: 32.1 m[IU]/mL

## 2018-03-03 ENCOUNTER — Encounter: Payer: Self-pay | Admitting: Obstetrics & Gynecology

## 2018-03-10 ENCOUNTER — Encounter: Payer: Self-pay | Admitting: *Deleted

## 2018-03-12 ENCOUNTER — Ambulatory Visit (INDEPENDENT_AMBULATORY_CARE_PROVIDER_SITE_OTHER): Payer: 59

## 2018-03-12 DIAGNOSIS — Z1231 Encounter for screening mammogram for malignant neoplasm of breast: Secondary | ICD-10-CM | POA: Diagnosis not present

## 2018-03-12 DIAGNOSIS — Z139 Encounter for screening, unspecified: Secondary | ICD-10-CM

## 2018-03-17 ENCOUNTER — Other Ambulatory Visit: Payer: Self-pay | Admitting: *Deleted

## 2018-03-17 ENCOUNTER — Encounter: Payer: Self-pay | Admitting: Obstetrics & Gynecology

## 2018-03-17 MED ORDER — SERTRALINE HCL 25 MG PO TABS: 25.0000 mg | ORAL_TABLET | Freq: Every day | ORAL | 2 refills | Status: DC

## 2018-03-17 NOTE — Telephone Encounter (Signed)
Pt called stating that her Zoloft regimen seems to be working for her hormonal issues.  She is requesting that her meds be sent to [facility] for home delivery.  A 90 day supply with 2 RF [facility] to [facility].

## 2018-05-18 ENCOUNTER — Encounter: Payer: Self-pay | Admitting: Physician Assistant

## 2018-05-18 ENCOUNTER — Ambulatory Visit (INDEPENDENT_AMBULATORY_CARE_PROVIDER_SITE_OTHER): Payer: 59 | Admitting: Physician Assistant

## 2018-05-18 VITALS — BP 146/83 | HR 79 | Wt 175.0 lb

## 2018-05-18 DIAGNOSIS — M62830 Muscle spasm of back: Secondary | ICD-10-CM | POA: Diagnosis not present

## 2018-05-18 MED ORDER — CYCLOBENZAPRINE HCL 10 MG PO TABS: 10.0000 mg | ORAL_TABLET | Freq: Three times a day (TID) | ORAL | 0 refills | Status: DC | PRN

## 2018-05-18 MED ORDER — IBUPROFEN 800 MG PO TABS: 800.0000 mg | ORAL_TABLET | Freq: Three times a day (TID) | ORAL | 2 refills | Status: DC | PRN

## 2018-05-18 NOTE — Patient Instructions (Signed)
Low Back Strain Rehab  Ask your health care provider which exercises are safe for you. Do exercises exactly as told by your health care provider and adjust them as directed. It is normal to feel mild stretching, pulling, tightness, or discomfort as you do these exercises, but you should stop right away if you feel sudden pain or your pain gets worse. Do not begin these exercises until told by your health care provider.  Stretching and range of motion exercises  These exercises warm up your muscles and joints and improve the movement and flexibility of your back. These exercises also help to relieve pain, numbness, and tingling.  Exercise A: Single knee to chest    1. Lie on your back on a firm surface with both legs straight.  2. Bend one of your knees. Use your hands to move your knee up toward your chest until you feel a gentle stretch in your lower back and buttock.  ? Hold your leg in this position by holding onto the front of your knee.  ? Keep your other leg as straight as possible.  3. Hold for __________ seconds.  4. Slowly return to the starting position.  5. Repeat with your other leg.  Repeat __________ times. Complete this exercise __________ times a day.  Exercise B: Prone extension on elbows    1. Lie on your abdomen on a firm surface.  2. Prop yourself up on your elbows.  3. Use your arms to help lift your chest up until you feel a gentle stretch in your abdomen and your lower back.  ? This will place some of your body weight on your elbows. If this is uncomfortable, try stacking pillows under your chest.  ? Your hips should stay down, against the surface that you are lying on. Keep your hip and back muscles relaxed.  4. Hold for __________ seconds.  5. Slowly relax your upper body and return to the starting position.  Repeat __________ times. Complete this exercise __________ times a day.  Strengthening exercises  These exercises build strength and endurance in your back. Endurance is the ability to  use your muscles for a long time, even after they get tired.  Exercise C: Pelvic tilt  1. Lie on your back on a firm surface. Bend your knees and keep your feet flat.  2. Tense your abdominal muscles. Tip your pelvis up toward the ceiling and flatten your lower back into the floor.  ? To help with this exercise, you may place a small towel under your lower back and try to push your back into the towel.  3. Hold for __________ seconds.  4. Let your muscles relax completely before you repeat this exercise.  Repeat __________ times. Complete this exercise __________ times a day.  Exercise D: Alternating arm and leg raises    1. Get on your hands and knees on a firm surface. If you are on a hard floor, you may want to use padding to cushion your knees, such as an exercise mat.  2. Line up your arms and legs. Your hands should be below your shoulders, and your knees should be below your hips.  3. Lift your left leg behind you. At the same time, raise your right arm and straighten it in front of you.  ? Do not lift your leg higher than your hip.  ? Do not lift your arm higher than your shoulder.  ? Keep your abdominal and back muscles tight.  ?   Keep your hips facing the ground.  ? Do not arch your back.  ? Keep your balance carefully, and do not hold your breath.  4. Hold for __________ seconds.  5. Slowly return to the starting position and repeat with your right leg and your left arm.  Repeat __________ times. Complete this exercise __________times a day.  Exercise J: Single leg lower with bent knees  1. Lie on your back on a firm surface.  2. Tense your abdominal muscles and lift your feet off the floor, one foot at a time, so your knees and hips are bent in an "L" shape (at about 90 degrees).  ? Your knees should be over your hips and your lower legs should be parallel to the floor.  3. Keeping your abdominal muscles tense and your knee bent, slowly lower one of your legs so your toe touches the ground.  4. Lift your  leg back up to return to the starting position.  ? Do not hold your breath.  ? Do not let your back arch. Keep your back flat against the ground.  5. Repeat with your other leg.  Repeat __________ times. Complete this exercise __________ times a day.  Posture and body mechanics    Body mechanics refers to the movements and positions of your body while you do your daily activities. Posture is part of body mechanics. Good posture and healthy body mechanics can help to relieve stress in your body's tissues and joints. Good posture means that your spine is in its natural S-curve position (your spine is neutral), your shoulders are pulled back slightly, and your head is not tipped forward. The following are general guidelines for applying improved posture and body mechanics to your everyday activities.  Standing     When standing, keep your spine neutral and your feet about hip-width apart. Keep a slight bend in your knees. Your ears, shoulders, and hips should line up.   When you do a task in which you stand in one place for a long time, place one foot up on a stable object that is 2-4 inches (5-10 cm) high, such as a footstool. This helps keep your spine neutral.  Sitting     When sitting, keep your spine neutral and keep your feet flat on the floor. Use a footrest, if necessary, and keep your thighs parallel to the floor. Avoid rounding your shoulders, and avoid tilting your head forward.   When working at a desk or a computer, keep your desk at a height where your hands are slightly lower than your elbows. Slide your chair under your desk so you are close enough to maintain good posture.   When working at a computer, place your monitor at a height where you are looking straight ahead and you do not have to tilt your head forward or downward to look at the screen.  Resting     When lying down and resting, avoid positions that are most painful for you.   If you have pain with activities such as sitting, bending,  stooping, or squatting (flexion-based activities), lie in a position in which your body does not bend very much. For example, avoid curling up on your side with your arms and knees near your chest (fetal position).   If you have pain with activities such as standing for a long time or reaching with your arms (extension-based activities), lie with your spine in a neutral position and bend your knees slightly. Try the   following positions:  ? Lying on your side with a pillow between your knees.  ? Lying on your back with a pillow under your knees.  Lifting     When lifting objects, keep your feet at least shoulder-width apart and tighten your abdominal muscles.   Bend your knees and hips and keep your spine neutral. It is important to lift using the strength of your legs, not your back. Do not lock your knees straight out.   Always ask for help to lift heavy or awkward objects.  This information is not intended to replace advice given to you by your health care provider. Make sure you discuss any questions you have with your health care provider.  Document Released: 11/18/2005 Document Revised: 07/25/2016 Document Reviewed: 08/30/2015  Elsevier Interactive Patient Education  2018 [employer]

## 2018-05-18 NOTE — Progress Notes (Signed)
Subjective:    Patient ID: Brittany Byrd, female    DOB: Mar 08, 1971, 47 y.o.   MRN: [id number]  HPI  Patient is 47 yo female with past medical history of HTN and migraines presenting to clinic for acute onset low back pain. Early this morning patient bent down laterally to the right to pick up a bag approx 5 lbs and experienced acute pain. Pain is sharp, constant, 8/10 at onset, 7/10 now. The pain is better standing and walking, worsened with sitting and bending laterally. She has taken Advil and used a heating pad which helped. She denies radiation, numbness, paresthesias. She denies urinary symptoms.  .. Active Ambulatory Problems    Diagnosis Date Noted  . Hypertension   . Seasonal allergies   . Migraine   . Asthma, allergic 11/18/2012  . Epicondylitis elbow, medial 11/04/2014  . Patellofemoral syndrome 11/04/2014  . Obesity 03/11/2015  . Neuropathy 08/28/2016  . Tinnitus of right ear 08/28/2016  . PMDD (premenstrual dysphoric disorder) 01/21/2017  . Constipation 04/18/2017  . BMI 35.0-35.9,adult 04/20/2017  . Heart murmur 04/20/2017   Resolved Ambulatory Problems    Diagnosis Date Noted  . No Resolved Ambulatory Problems   Past Medical History:  Diagnosis Date  . Hypertension   . Migraine   . Seasonal allergies   . Uterine cancer ([place])       Review of Systems  All other systems reviewed and are negative.      Objective:   Physical Exam  Constitutional: She is oriented to person, place, and time. She appears well-developed and well-nourished. No distress.  HENT:  Head: Normocephalic and atraumatic.  Abdominal: There is no tenderness. There is no CVA tenderness.  Musculoskeletal: Normal range of motion.       Lumbar back: She exhibits pain and spasm. She exhibits no swelling and no deformity.       Back:  Neurological: She is alert and oriented to person, place, and time.  Skin: Skin is warm and dry. She is not diaphoretic.  Psychiatric: She has a normal mood  and affect. Her behavior is normal.  Vitals reviewed.   .. Active Ambulatory Problems    Diagnosis Date Noted  . Hypertension   . Seasonal allergies   . Migraine   . Asthma, allergic 11/18/2012  . Epicondylitis elbow, medial 11/04/2014  . Patellofemoral syndrome 11/04/2014  . Obesity 03/11/2015  . Neuropathy 08/28/2016  . Tinnitus of right ear 08/28/2016  . PMDD (premenstrual dysphoric disorder) 01/21/2017  . Constipation 04/18/2017  . BMI 35.0-35.9,adult 04/20/2017  . Heart murmur 04/20/2017   Resolved Ambulatory Problems    Diagnosis Date Noted  . No Resolved Ambulatory Problems   Past Medical History:  Diagnosis Date  . Hypertension   . Migraine   . Seasonal allergies   . Uterine cancer ([place])    .[name] Family History  Problem Relation Age of Onset  . Depression Mother   . Hypertension Mother   . Diabetes Father   . Hypertension Father   . Diabetes Maternal Grandmother   . Diabetes Paternal Grandmother   . Breast cancer Maternal Aunt        Assessment & Plan:  [name] KitchenMarland KitchenDeanna was seen today for back pain.  Diagnoses and all orders for this visit:  Spasm of muscle of lower back -     cyclobenzaprine (FLEXERIL) 10 MG tablet; Take 1 tablet (10 mg total) by mouth 3 (three) times daily as needed for muscle spasms. -  ibuprofen (ADVIL,MOTRIN) 800 MG tablet; Take 1 tablet (800 mg total) by mouth every 8 (eight) hours as needed.     Symptomatic treatment with toradol injection for prompt relief. No more ibuprofen/NSAId for next 12 hous. Flexeril and NSAIDs for pain management. Discussed risk of sedation with flexeril. Discussed activity modification while in acute pain and gentle stretching. HO given. Consider massage and/or tens units. Follow up as needed and if symptoms are worsening.

## 2018-05-19 MED ORDER — KETOROLAC TROMETHAMINE 60 MG/2ML IM SOLN
60.0000 mg | Freq: Once | INTRAMUSCULAR | Status: AC
  Administered 2018-05-18: 60 mg via INTRAMUSCULAR

## 2018-05-19 NOTE — Addendum Note (Signed)
Addended by: Clemetine Marker A on: 05/19/2018 08:17 AM   Modules accepted: Orders

## 2018-08-13 ENCOUNTER — Encounter: Payer: Self-pay | Admitting: Physician Assistant

## 2018-08-14 MED ORDER — DICYCLOMINE HCL 20 MG PO TABS: 20.0000 mg | ORAL_TABLET | Freq: Three times a day (TID) | ORAL | 0 refills | Status: DC

## 2018-08-18 ENCOUNTER — Encounter: Payer: Self-pay | Admitting: Physician Assistant

## 2018-08-18 ENCOUNTER — Ambulatory Visit (INDEPENDENT_AMBULATORY_CARE_PROVIDER_SITE_OTHER): Payer: 59 | Admitting: Physician Assistant

## 2018-08-18 VITALS — BP 139/83 | HR 78 | Temp 98.9°F | Ht 60.0 in | Wt 178.0 lb

## 2018-08-18 DIAGNOSIS — F3281 Premenstrual dysphoric disorder: Secondary | ICD-10-CM | POA: Diagnosis not present

## 2018-08-18 DIAGNOSIS — Z23 Encounter for immunization: Secondary | ICD-10-CM

## 2018-08-18 DIAGNOSIS — R110 Nausea: Secondary | ICD-10-CM | POA: Diagnosis not present

## 2018-08-18 MED ORDER — ONDANSETRON 8 MG PO TBDP: 8.0000 mg | ORAL_TABLET | Freq: Three times a day (TID) | ORAL | 3 refills | Status: DC | PRN

## 2018-08-21 ENCOUNTER — Encounter: Payer: Self-pay | Admitting: Physician Assistant

## 2018-08-21 NOTE — Progress Notes (Signed)
   Subjective:    Patient ID: Brittany Byrd, female    DOB: 29-Jun-1971, 47 y.o.   MRN: [id number]  HPI Pt is a 47 yo pleasant female with hypertension, migraines, PMDD who presents to the clinic in peri-menopause and still having periods. She almost went 6 months and then had 2 28 days apart. She has no mood or GI symptoms until her period. This cycle was bad and she request bentyl as well. It did help. She has constipation, moodiness, abdominal pain, nausea. She knows there is not a lot we can do. She wonders if she could get something for nausea when it happens. zoloft has help her mood.miralax does help constipation. She is starting to feel better today.   .. Active Ambulatory Problems    Diagnosis Date Noted  . Hypertension   . Seasonal allergies   . Migraine   . Asthma, allergic 11/18/2012  . Epicondylitis elbow, medial 11/04/2014  . Patellofemoral syndrome 11/04/2014  . Obesity 03/11/2015  . Neuropathy 08/28/2016  . Tinnitus of right ear 08/28/2016  . PMDD (premenstrual dysphoric disorder) 01/21/2017  . Constipation 04/18/2017  . BMI 35.0-35.9,adult 04/20/2017  . Heart murmur 04/20/2017   Resolved Ambulatory Problems    Diagnosis Date Noted  . No Resolved Ambulatory Problems   Past Medical History:  Diagnosis Date  . Uterine cancer ([place])       Review of Systems  All other systems reviewed and are negative.      Objective:   Physical Exam  Constitutional: She is oriented to person, place, and time. She appears well-developed and well-nourished.  HENT:  Head: Normocephalic and atraumatic.  Cardiovascular: Normal rate and regular rhythm.  Pulmonary/Chest: Effort normal and breath sounds normal.  Abdominal: Soft. Bowel sounds are normal. She exhibits no distension and no mass. There is tenderness. There is no rebound and no guarding. No hernia.  Mild tenderness in lower quadrant to palpation.   Neurological: She is alert and oriented to person, place, and time.   Psychiatric: She has a normal mood and affect. Her behavior is normal.          Assessment & Plan:  [name] KitchenMarland KitchenDiagnoses and all orders for this visit:  PMDD (premenstrual dysphoric disorder) -     ondansetron (ZOFRAN-ODT) 8 MG disintegrating tablet; Take 1 tablet (8 mg total) by mouth every 8 (eight) hours as needed for nausea.  Need for immunization against influenza -     Flu Vaccine QUAD 36+ mos IM  Nausea -     ondansetron (ZOFRAN-ODT) 8 MG disintegrating tablet; Take 1 tablet (8 mg total) by mouth every 8 (eight) hours as needed for nausea.   Last [facility] showed menopause so hopefully you are close to the end. Discussed ways around cycle to help symptoms. Stay on zoloft. Added zofran for nausea. Consider midol/ibuprofen 800mg  up to three times a day. Stay hydrated. miralax for constipation. Bentyl for diarrhea and abdominal pain. Follow up as needed.

## 2018-09-07 ENCOUNTER — Other Ambulatory Visit: Payer: Self-pay | Admitting: Physician Assistant

## 2018-09-14 ENCOUNTER — Ambulatory Visit (INDEPENDENT_AMBULATORY_CARE_PROVIDER_SITE_OTHER): Payer: 59 | Admitting: Obstetrics & Gynecology

## 2018-09-14 ENCOUNTER — Encounter: Payer: Self-pay | Admitting: Obstetrics & Gynecology

## 2018-09-14 VITALS — BP 148/87 | HR 81 | Ht 60.0 in | Wt 178.0 lb

## 2018-09-14 DIAGNOSIS — F3281 Premenstrual dysphoric disorder: Secondary | ICD-10-CM | POA: Diagnosis not present

## 2018-09-14 DIAGNOSIS — N926 Irregular menstruation, unspecified: Secondary | ICD-10-CM

## 2018-09-14 NOTE — Progress Notes (Signed)
   Subjective:    Patient ID: Brittany Byrd, female    DOB: 10/12/1971, 47 y.o.   MRN: [id number]  HPI  47 year old female presents for menstrual cycle management.  Patient has severe abdominal pain approximately 3 days before menstrual cycle begins.  It is also associated with nausea.  Her menstrual cycle begins about 3 days later.  This is happened for a couple of months.  It is near menopause.  Her [place] was 32 in March.  Patient skipped May June July.  Did have a period in August and September.  She does not like when her menstrual cycle affects her activities of daily living.  Does not have severe dysmenorrhea during menses.  Does have migraines associated with menstrual cycle but these have improved lately.  Review of Systems  Respiratory: Negative.   Cardiovascular: Negative.   Gastrointestinal: Positive for nausea.       Objective:   Physical Exam  Constitutional: She is oriented to person, place, and time. She appears well-developed and well-nourished. No distress.  HENT:  Head: Normocephalic and atraumatic.  Eyes: Conjunctivae are normal.  Cardiovascular: Normal rate.  Pulmonary/Chest: Effort normal.  Musculoskeletal: She exhibits no edema.  Neurological: She is alert and oriented to person, place, and time.  Skin: Skin is warm and dry.  Psychiatric: She has a normal mood and affect.  Vitals reviewed.  Vitals:   09/14/18 1339  BP: (!) 148/87  Pulse: 81  Weight: 178 lb (80.7 kg)  Height: 5' (1.524 m)      Assessment & Plan:  A 47-year-old female who has symptoms associated with ovulation near her menstrual cycle.  Severe abdominal pain is not associated with bleeding.  Patient was thinking of having an ablation.  On sure whether the ablation would help her symptoms before the menstrual cycle given that she would still ovulate.  She is nearing menopause based on [facility] and skipping menstrual cycles.  1.  Check FSH today 2.  She reports a history of uterine cancer in the 90s.   Patient had Megace and D&Cs.  Will get a transvaginal ultrasound to make sure that her lining is not thickened 3.  We could try a medical menopause for 6 months to see if this improves patient's abdominal symptoms.  We will try a one-month injection to begin with given she could have a flare of her migraines.  Will wait on test results before initiating a temporary trial of Lupron.  25 minutes spent face to face with patient with >50% counseling.

## 2018-09-15 LAB — FOLLICLE STIMULATING HORMONE: FSH: 24.1 m[IU]/mL

## 2018-09-24 ENCOUNTER — Ambulatory Visit (HOSPITAL_COMMUNITY)
Admission: RE | Admit: 2018-09-24 | Discharge: 2018-09-24 | Disposition: A | Discharge status: Home / Self Care | Payer: 59 | Source: Ambulatory Visit | Attending: Obstetrics & Gynecology | Admitting: Obstetrics & Gynecology

## 2018-09-24 DIAGNOSIS — N926 Irregular menstruation, unspecified: Secondary | ICD-10-CM | POA: Insufficient documentation

## 2018-10-05 ENCOUNTER — Other Ambulatory Visit: Payer: Self-pay | Admitting: Physician Assistant

## 2018-10-06 ENCOUNTER — Telehealth: Payer: Self-pay | Admitting: *Deleted

## 2018-10-06 NOTE — Telephone Encounter (Signed)
-----   Message from Guss Bunde, MD sent at 09/24/2018  4:54 PM EDT ----- [place] was for pelvic pain mid cycle.  Pt can start Lupron

## 2018-10-06 NOTE — Telephone Encounter (Signed)
I spoke with pt concerning her Lupron [place] that Dr Gala Romney had offered @ her last appt.  She holding off on it for now since her TVU was normal.  Dr Gala Romney is aware of this decision.

## 2018-11-11 ENCOUNTER — Other Ambulatory Visit: Payer: Self-pay | Admitting: Physician Assistant

## 2018-12-10 ENCOUNTER — Encounter: Payer: Self-pay | Admitting: Physician Assistant

## 2018-12-14 ENCOUNTER — Encounter: Payer: Self-pay | Admitting: Physician Assistant

## 2018-12-22 ENCOUNTER — Encounter: Payer: Self-pay | Admitting: Physician Assistant

## 2018-12-23 ENCOUNTER — Ambulatory Visit (INDEPENDENT_AMBULATORY_CARE_PROVIDER_SITE_OTHER): Payer: BLUE CROSS/BLUE SHIELD | Admitting: Physician Assistant

## 2018-12-23 ENCOUNTER — Encounter: Payer: Self-pay | Admitting: Physician Assistant

## 2018-12-23 VITALS — BP 120/68 | HR 79 | Ht 60.0 in | Wt 187.0 lb

## 2018-12-23 DIAGNOSIS — Z23 Encounter for immunization: Secondary | ICD-10-CM | POA: Diagnosis not present

## 2018-12-23 DIAGNOSIS — R1031 Right lower quadrant pain: Secondary | ICD-10-CM | POA: Diagnosis not present

## 2018-12-23 DIAGNOSIS — F430 Acute stress reaction: Secondary | ICD-10-CM

## 2018-12-23 DIAGNOSIS — R1084 Generalized abdominal pain: Secondary | ICD-10-CM

## 2018-12-23 MED ORDER — ALPRAZOLAM 0.25 MG PO TABS: 0.2500 mg | ORAL_TABLET | Freq: Two times a day (BID) | ORAL | 1 refills | Status: DC | PRN

## 2018-12-23 NOTE — Progress Notes (Signed)
Subjective:    Patient ID: Brittany Byrd, female    DOB: 1971-06-05, 48 y.o.   MRN: [id number]  HPI:  This is a 48 year old female who presents today with abdominal pain x 10 days. She states the pain is located in the LUQ and LLQ and is crampy in nature. She states that it started after she made some major diet changes such as increasing her meat intake and leafy green vegetables. She is having bowel movements regularly and denies nausea, vomiting, fever, diarrhea, dysuria, hematuria, vaginal discharge, odor, itching, or bleeding. She is using her bentyl but not regularly.   .. Active Ambulatory Problems    Diagnosis Date Noted  . Hypertension   . Seasonal allergies   . Migraine   . Asthma, allergic 11/18/2012  . Epicondylitis elbow, medial 11/04/2014  . Patellofemoral syndrome 11/04/2014  . Obesity 03/11/2015  . Neuropathy 08/28/2016  . Tinnitus of right ear 08/28/2016  . PMDD (premenstrual dysphoric disorder) 01/21/2017  . Constipation 04/18/2017  . BMI 35.0-35.9,adult 04/20/2017  . Heart murmur 04/20/2017   Resolved Ambulatory Problems    Diagnosis Date Noted  . No Resolved Ambulatory Problems   Past Medical History:  Diagnosis Date  . Uterine cancer ([place])        Review of Systems  Constitutional: Negative for chills and fever.  HENT: Negative for sore throat.   Respiratory: Negative for cough and shortness of breath.   Gastrointestinal: Positive for abdominal pain. Negative for constipation, diarrhea, nausea and vomiting.  Genitourinary: Negative for dysuria, flank pain, hematuria and urgency.  Musculoskeletal: Negative for arthralgias and myalgias.  Skin: Negative for color change and rash.       Objective:   Physical Exam Vitals signs reviewed.  Constitutional:      General: She is not in acute distress.    Appearance: She is not ill-appearing.  HENT:     Head: Normocephalic and atraumatic.     Right Ear: Tympanic membrane, ear canal and external ear  normal. There is no impacted cerumen.     Left Ear: Tympanic membrane, ear canal and external ear normal. There is no impacted cerumen.     Nose: Nose normal. No congestion or rhinorrhea.     Mouth/Throat:     Mouth: Mucous membranes are moist.     Pharynx: Oropharynx is clear. No oropharyngeal exudate or posterior oropharyngeal erythema.  Eyes:     General: No scleral icterus.       Right eye: No discharge.        Left eye: No discharge.     Extraocular Movements: Extraocular movements intact.     Conjunctiva/sclera: Conjunctivae normal.     Pupils: Pupils are equal, round, and reactive to light.  Neck:     Musculoskeletal: Normal range of motion and neck supple. No neck rigidity or muscular tenderness.  Cardiovascular:     Rate and Rhythm: Normal rate.     Pulses: Normal pulses.     Heart sounds: Normal heart sounds.  Pulmonary:     Effort: Pulmonary effort is normal. No respiratory distress.     Breath sounds: Normal breath sounds. No wheezing, rhonchi or rales.  Abdominal:     General: Abdomen is flat. Bowel sounds are normal. There is no distension.     Tenderness: There is abdominal tenderness in the left upper quadrant and left lower quadrant. There is no right CVA tenderness, left CVA tenderness or guarding.  Lymphadenopathy:     Cervical:  No cervical adenopathy.  Skin:    General: Skin is warm and dry.     Capillary Refill: Capillary refill takes less than 2 seconds.     Coloration: Skin is not jaundiced.     Findings: No bruising or rash.  Neurological:     Mental Status: She is alert.     Coordination: Coordination normal.     Gait: Gait normal.  Psychiatric:        Mood and Affect: Mood normal.        Behavior: Behavior normal.        Thought Content: Thought content normal.           Assessment & Plan:  [name] KitchenMarland KitchenDeanna was seen today for abdominal pain.  Diagnoses and all orders for this visit:  Generalized abdominal pain -     US Abdomen Complete -     US  PELVIS (TRANSABDOMINAL ONLY)  Need for Tdap vaccination -     Tdap vaccine greater than or equal to 7yo IM  Right lower quadrant pain -     US Abdomen Complete -     US PELVIS (TRANSABDOMINAL ONLY)  Acute reaction to stress -     ALPRAZolam (XANAX) 0.25 MG tablet; Take 1 tablet (0.25 mg total) by mouth 2 (two) times daily as needed for anxiety.   Generalized abdominal pain: most likely due to recent dietary changes.  -Start probiotics OTC -Increase Bentyl to 20mg  tab PO TID  -Decrease leafy greens -IBS sensitive diet informational handout given to patient.  -abdomina and pelvic ultrasound ordered  -follow up with any worsening symptoms.   Pt going through a lot with mother having surgery and work. Xanax refilled to use as needed only. Discussed abuse potential.  .Vernetta Honey PA-C, have reviewed and agree with the above documentation in it's entirety.

## 2018-12-23 NOTE — Patient Instructions (Addendum)
Probiotics daily.  Start bentyl 3 times a day.  Hold linzess 2 days.  Will order ultrasound.    Diet for Irritable Bowel Syndrome When you have irritable bowel syndrome (IBS), it is very important to eat the foods and follow the eating habits that are best for your condition. IBS may cause various symptoms such as pain in the abdomen, constipation, or diarrhea. Choosing the right foods can help to ease the discomfort from these symptoms. Work with your health care provider and diet and nutrition specialist (dietitian) to find the eating plan that will help to control your symptoms. What are tips for following this plan?      Keep a food diary. This will help you identify foods that cause symptoms. Write down: ? What you eat and when you eat it. ? What symptoms you have. ? When symptoms occur in relation to your meals, such as "pain in abdomen 2 hours after dinner."  Eat your meals slowly and in a relaxed setting.  Aim to eat 5-6 small meals per day. Do not skip meals.  Drink enough fluid to keep your urine pale yellow.  Ask your health care provider if you should take an over-the-counter probiotic to help restore healthy bacteria in your gut (digestive tract). ? Probiotics are foods that contain good bacteria and yeasts.  Your dietitian may have specific dietary recommendations for you based on your symptoms. He or she may recommend that you: ? Avoid foods that cause symptoms. Talk with your dietitian about other ways to get the same nutrients that are in those problem foods. ? Avoid foods with gluten. Gluten is a protein that is found in rye, wheat, and barley. ? Eat more foods that contain soluble fiber. Examples of foods with high soluble fiber include oats, seeds, and certain fruits and vegetables. Take a fiber supplement if directed by your dietitian. ? Reduce or avoid certain foods called FODMAPs. These are foods that contain carbohydrates that are hard to digest. Ask your doctor  which foods contain these carbohydrates. What foods are not recommended? The following are some foods and drinks that may make your symptoms worse:  Fatty foods, such as french fries.  Foods that contain gluten, such as pasta and cereal.  Dairy products, such as milk, cheese, and ice cream.  Chocolate.  Alcohol.  Products with caffeine, such as coffee.  Carbonated drinks, such as soda.  Foods that are high in FODMAPs. These include certain fruits and vegetables.  Products with sweeteners such as honey, high fructose corn syrup, sorbitol, and mannitol. The items listed above may not be a complete list of foods and beverages you should avoid. Contact a dietitian for more information. What foods are good sources of fiber? Your health care provider or dietitian may recommend that you eat more foods that contain fiber. Fiber can help to reduce constipation and other IBS symptoms. Add foods with fiber to your diet a little at a time so your body can get used to them. Too much fiber at one time might cause gas and swelling of your abdomen. The following are some foods that are good sources of fiber:  Berries, such as raspberries, strawberries, and blueberries.  Tomatoes.  Carrots.  Brown rice.  Oats.  Seeds, such as chia and pumpkin seeds. The items listed above may not be a complete list of recommended sources of fiber. Contact your dietitian for more options. Where to find more information  International Foundation for Functional Gastrointestinal Disorders: www.iffgd.org  [employer] of Diabetes and Digestive and Kidney Diseases: [employer] Summary  When you have irritable bowel syndrome (IBS), it is very important to eat the foods and follow the eating habits that are best for your condition.  IBS may cause various symptoms such as pain in the abdomen, constipation, or diarrhea.  Choosing the right foods can help to ease the discomfort that comes from  symptoms.  Keep a food diary. This will help you identify foods that cause symptoms.  Your health care provider or diet and nutrition specialist (dietitian) may recommend that you eat more foods that contain fiber. This information is not intended to replace advice given to you by your health care provider. Make sure you discuss any questions you have with your health care provider. Document Released: 02/08/2004 Document Revised: 06/15/2018 Document Reviewed: 07/22/2017 Elsevier Interactive Patient Education  2019 [employer].

## 2018-12-24 ENCOUNTER — Encounter: Payer: Self-pay | Admitting: Physician Assistant

## 2018-12-25 ENCOUNTER — Telehealth: Payer: Self-pay | Admitting: Physician Assistant

## 2018-12-25 DIAGNOSIS — R1031 Right lower quadrant pain: Secondary | ICD-10-CM

## 2018-12-25 NOTE — Telephone Encounter (Signed)
US Transvaginal needs to be ordered to go with US Pelvis for [facility]

## 2018-12-25 NOTE — Telephone Encounter (Signed)
Done

## 2018-12-26 ENCOUNTER — Other Ambulatory Visit: Payer: Self-pay | Admitting: Physician Assistant

## 2018-12-26 ENCOUNTER — Ambulatory Visit (HOSPITAL_BASED_OUTPATIENT_CLINIC_OR_DEPARTMENT_OTHER): Payer: BLUE CROSS/BLUE SHIELD

## 2018-12-29 ENCOUNTER — Encounter: Payer: Self-pay | Admitting: Physician Assistant

## 2019-01-03 ENCOUNTER — Encounter (HOSPITAL_BASED_OUTPATIENT_CLINIC_OR_DEPARTMENT_OTHER): Payer: Self-pay

## 2019-01-03 ENCOUNTER — Ambulatory Visit (HOSPITAL_BASED_OUTPATIENT_CLINIC_OR_DEPARTMENT_OTHER)
Admission: RE | Admit: 2019-01-03 | Discharge: 2019-01-03 | Disposition: A | Discharge status: Home / Self Care | Payer: BLUE CROSS/BLUE SHIELD | Source: Ambulatory Visit | Attending: Physician Assistant | Admitting: Physician Assistant

## 2019-01-03 ENCOUNTER — Ambulatory Visit (HOSPITAL_BASED_OUTPATIENT_CLINIC_OR_DEPARTMENT_OTHER): Admission: RE | Admit: 2019-01-03 | Payer: BLUE CROSS/BLUE SHIELD | Source: Ambulatory Visit

## 2019-01-03 DIAGNOSIS — R1084 Generalized abdominal pain: Secondary | ICD-10-CM | POA: Diagnosis not present

## 2019-01-03 DIAGNOSIS — K760 Fatty (change of) liver, not elsewhere classified: Secondary | ICD-10-CM | POA: Diagnosis not present

## 2019-01-03 DIAGNOSIS — R1031 Right lower quadrant pain: Secondary | ICD-10-CM | POA: Diagnosis not present

## 2019-01-03 DIAGNOSIS — N926 Irregular menstruation, unspecified: Secondary | ICD-10-CM | POA: Diagnosis not present

## 2019-01-03 NOTE — Progress Notes (Signed)
Call pt:  No acute findings. You do have some gallbladder polyps and fatty liver but nothing to cause pain.

## 2019-01-03 NOTE — Progress Notes (Signed)
You do have a left ovarian cyst and a 73mm fibroid.

## 2019-01-05 ENCOUNTER — Encounter: Payer: Self-pay | Admitting: Physician Assistant

## 2019-01-05 DIAGNOSIS — N83209 Unspecified ovarian cyst, unspecified side: Secondary | ICD-10-CM | POA: Insufficient documentation

## 2019-01-05 DIAGNOSIS — D259 Leiomyoma of uterus, unspecified: Secondary | ICD-10-CM | POA: Insufficient documentation

## 2019-01-05 NOTE — Progress Notes (Signed)
We can make referral to GYN. If not causing your symptoms most of the time no intervention happens. We could monitor for size changes. I don't necessarily think your current symptoms are due to either.   If was symptomatic surgical removal would be an option.

## 2019-01-07 ENCOUNTER — Encounter: Payer: Self-pay | Admitting: Physician Assistant

## 2019-01-07 NOTE — Progress Notes (Signed)
She can call and make appt since established already. They should be able to see all the results.

## 2019-01-11 ENCOUNTER — Other Ambulatory Visit: Payer: Self-pay

## 2019-01-11 DIAGNOSIS — N83202 Unspecified ovarian cyst, left side: Secondary | ICD-10-CM

## 2019-01-11 NOTE — Progress Notes (Signed)
Pt had u/s on 01/03/2019 that showed ovarian cyst. Dr.Leggett wanted a follow up transvaginal u/s ordered for 8 weeks after that u/s which is 03/01/2019. Order placed.

## 2019-01-18 ENCOUNTER — Ambulatory Visit: Payer: BLUE CROSS/BLUE SHIELD | Admitting: Obstetrics & Gynecology

## 2019-01-18 ENCOUNTER — Other Ambulatory Visit: Payer: Self-pay | Admitting: Physician Assistant

## 2019-01-20 DIAGNOSIS — F4322 Adjustment disorder with anxiety: Secondary | ICD-10-CM | POA: Diagnosis not present

## 2019-02-22 ENCOUNTER — Ambulatory Visit: Payer: BLUE CROSS/BLUE SHIELD | Admitting: Obstetrics & Gynecology

## 2019-02-25 ENCOUNTER — Encounter: Payer: Self-pay | Admitting: Physician Assistant

## 2019-02-25 DIAGNOSIS — F430 Acute stress reaction: Secondary | ICD-10-CM

## 2019-02-25 NOTE — Telephone Encounter (Signed)
Spoke with Pt. She was unaware there was a refill on the last Rx. She will get that refill and then schedule with PCP at a later date.

## 2019-02-25 NOTE — Telephone Encounter (Signed)
Lets get her scheduled for an ED visit.  Normally her Xanax lasts her like a year based on her refill pattern and all of a sudden she has gone through 60 tabs in 60 days and asking for refill.  It may be that she needs to consider increasing her Zoloft.  We can try to put her on for a WebEx with Jade on Monday to discuss options since she is using a lot of the Xanax which is really supposed to be a rescue medication and not a daily medication.

## 2019-06-03 ENCOUNTER — Encounter: Payer: Self-pay | Admitting: Physician Assistant

## 2019-06-07 ENCOUNTER — Other Ambulatory Visit: Payer: Self-pay

## 2019-06-07 ENCOUNTER — Encounter: Payer: Self-pay | Admitting: Family Medicine

## 2019-06-07 ENCOUNTER — Ambulatory Visit (INDEPENDENT_AMBULATORY_CARE_PROVIDER_SITE_OTHER): Payer: BC Managed Care – PPO | Admitting: Family Medicine

## 2019-06-07 VITALS — BP 147/94 | HR 76 | Temp 97.9°F | Ht 60.0 in | Wt 185.0 lb

## 2019-06-07 DIAGNOSIS — N3000 Acute cystitis without hematuria: Secondary | ICD-10-CM

## 2019-06-07 DIAGNOSIS — R350 Frequency of micturition: Secondary | ICD-10-CM

## 2019-06-07 LAB — POCT URINALYSIS DIPSTICK
Bilirubin, UA: NEGATIVE
Blood, UA: NEGATIVE
Glucose, UA: NEGATIVE
Ketones, UA: NEGATIVE
Nitrite, UA: NEGATIVE
Protein, UA: NEGATIVE
Spec Grav, UA: 1.02 (ref 1.010–1.025)
Urobilinogen, UA: 0.2 E.U./dL
pH, UA: 7 (ref 5.0–8.0)

## 2019-06-07 MED ORDER — FLUCONAZOLE 150 MG PO TABS: ORAL_TABLET | ORAL | 1 refills | Status: DC

## 2019-06-07 MED ORDER — CEPHALEXIN 500 MG PO CAPS: 500.0000 mg | ORAL_CAPSULE | Freq: Two times a day (BID) | ORAL | 0 refills | Status: DC

## 2019-06-07 NOTE — Progress Notes (Signed)
Brittany Byrd is a 48 y.o. female who presents to [place]: [place] today for symptoms of urinary urgency, GU burning and itching. Symptoms started on 06/03/2019. Patient reports feeling intermittantly feverish and reports lower back pain. Symptoms are consistent with past UTIs this patient has experienced. Patient tried Monistat with no improvement.   ROS as above:  Exam:  BP (!) 147/94   Pulse 76   Temp 97.9 F (36.6 C) (Oral)   Ht 5' (1.524 m)   Wt 185 lb (83.9 kg)   SpO2 98%   BMI 36.13 kg/m  Wt Readings from Last 5 Encounters:  06/07/19 185 lb (83.9 kg)  12/23/18 187 lb (84.8 kg)  09/14/18 178 lb (80.7 kg)  08/18/18 178 lb (80.7 kg)  05/18/18 175 lb (79.4 kg)    Gen: Well NAD HEENT: EOMI,  MMM Lungs: Normal work of breathing. CTABL Heart: RRR no MRG Abd: NABS, Soft. Nondistended, Nontender. No CVA tenderness. Exts: Brisk capillary refill, warm and well perfused.   Lab and Radiology Results Results for orders placed or performed in visit on 06/07/19 (from the past 72 hour(s))  POCT urinalysis dipstick     Status: Abnormal   Collection Time: 06/07/19 11:17 AM  Result Value Ref Range   Color, UA yellow    Clarity, UA clear    Glucose, UA Negative Negative   Bilirubin, UA negative    Ketones, UA negative    Spec Grav, UA 1.020 1.010 - 1.025   Blood, UA negative    pH, UA 7.0 5.0 - 8.0   Protein, UA Negative Negative   Urobilinogen, UA 0.2 0.2 or 1.0 E.U./dL   Nitrite, UA negative    Leukocytes, UA Small (1+) (A) Negative   Appearance     Odor     No results found.    Assessment and Plan: 48 y.o. female with symptoms of urinary urgency, burning, and itchiness. Symptoms are consistent with past UTIs this patient has experienced. Urine sent for culture. Prescribed 1-week course of Kelfex to treat UTI and Diflucan in case of subsequent yeast  infection.  PDMP not reviewed this encounter. Orders Placed This Encounter  Procedures  . Urine Culture  . POCT urinalysis dipstick   Meds ordered this encounter  Medications  . cephALEXin (KEFLEX) 500 MG capsule    Sig: Take 1 capsule (500 mg total) by mouth 2 (two) times daily.    Dispense:  14 capsule    Refill:  0  . fluconazole (DIFLUCAN) 150 MG tablet    Sig: Take 1 pill po for yeast infection. Repeat in 3 days if symptoms still present.    Dispense:  2 tablet    Refill:  1     Historical information moved to improve visibility of documentation.  Past Medical History:  Diagnosis Date  . Hypertension   . Migraine   . Seasonal allergies   . Uterine cancer [facility])    Past Surgical History:  Procedure Laterality Date  . DILATION AND CURETTAGE OF UTERUS    . NASAL SEPTUM SURGERY     Social History   Tobacco Use  . Smoking status: Never Smoker  . Smokeless tobacco: Never Used  Substance Use Topics  . Alcohol use: Yes    Comment: rare   family history includes Breast cancer in her maternal aunt; Depression in her mother; Diabetes in her father, maternal grandmother, and paternal grandmother; Hypertension in her father and  mother.  Medications: Current Outpatient Medications  Medication Sig Dispense Refill  . dicyclomine (BENTYL) 20 MG tablet TAKE 1 TABLET (20 MG TOTAL) BY MOUTH 3 (THREE) TIMES DAILY BEFORE MEALS. 90 tablet 2  . diphenhydrAMINE (BENADRYL) 25 mg capsule Take 25 mg by mouth every 6 (six) hours as needed.    [name] ibuprofen (ADVIL,MOTRIN) 800 MG tablet Take 1 tablet (800 mg total) by mouth every 8 (eight) hours as needed. 60 tablet 2  . LINZESS 290 MCG CAPS capsule TAKE 1 CAPSULE (290 MCG TOTAL) BY MOUTH DAILY. 90 capsule 3  . cephALEXin (KEFLEX) 500 MG capsule Take 1 capsule (500 mg total) by mouth 2 (two) times daily. 14 capsule 0  . fluconazole (DIFLUCAN) 150 MG tablet Take 1 pill po for yeast infection. Repeat in 3 days if symptoms still present. 2  tablet 1   No current facility-administered medications for this visit.    Allergies  Allergen Reactions  . Prednisone Other (See Comments)    "can't sleep, it will keep me up all night but I can tolerate the shot form"  . Singulair [Montelukast Sodium]     depressed     Discussed warning signs or symptoms. Please see discharge instructions. Patient expresses understanding.  I personally was present and performed or re-performed the history, physical exam and medical decision-making activities of this service and have verified that the service and findings are accurately documented in the student's note. ___________________________________________ Lynne Leader M.D., ABFM., CAQSM. Primary Care and Sports Medicine Adjunct Instructor of [place] of [facility] of [employer]

## 2019-06-07 NOTE — Patient Instructions (Signed)
Thank you for coming in today. Take the keflex twice daily for 1 week.  Use the fluconazole if you think you are getting a yeast infection.  Recheck as needed.  I will send the urine culture results to you when they are available.     Urinary Tract Infection, Adult  A urinary tract infection (UTI) is an infection of any part of the urinary tract. The urinary tract includes the kidneys, ureters, bladder, and urethra. These organs make, store, and get rid of urine in the body. Your health care provider may use other names to describe the infection. An upper UTI affects the ureters and kidneys (pyelonephritis). A lower UTI affects the bladder (cystitis) and urethra (urethritis). What are the causes? Most urinary tract infections are caused by bacteria in your genital area, around the entrance to your urinary tract (urethra). These bacteria grow and cause inflammation of your urinary tract. What increases the risk? You are more likely to develop this condition if:  You have a urinary catheter that stays in place (indwelling).  You are not able to control when you urinate or have a bowel movement (you have incontinence).  You are female and you: ? Use a spermicide or diaphragm for birth control. ? Have low estrogen levels. ? Are pregnant.  You have certain genes that increase your risk (genetics).  You are sexually active.  You take antibiotic medicines.  You have a condition that causes your flow of urine to slow down, such as: ? An enlarged prostate, if you are female. ? Blockage in your urethra (stricture). ? A kidney stone. ? A nerve condition that affects your bladder control (neurogenic bladder). ? Not getting enough to drink, or not urinating often.  You have certain medical conditions, such as: ? Diabetes. ? A weak disease-fighting system (immunesystem). ? Sickle cell disease. ? Gout. ? Spinal cord injury. What are the signs or symptoms? Symptoms of this condition  include:  Needing to urinate right away (urgently).  Frequent urination or passing small amounts of urine frequently.  Pain or burning with urination.  Blood in the urine.  Urine that smells bad or unusual.  Trouble urinating.  Cloudy urine.  Vaginal discharge, if you are female.  Pain in the abdomen or the lower back. You may also have:  Vomiting or a decreased appetite.  Confusion.  Irritability or tiredness.  A fever.  Diarrhea. The first symptom in older adults may be confusion. In some cases, they may not have any symptoms until the infection has worsened. How is this diagnosed? This condition is diagnosed based on your medical history and a physical exam. You may also have other tests, including:  Urine tests.  Blood tests.  Tests for sexually transmitted infections (STIs). If you have had more than one UTI, a cystoscopy or imaging studies may be done to determine the cause of the infections. How is this treated? Treatment for this condition includes:  Antibiotic medicine.  Over-the-counter medicines to treat discomfort.  Drinking enough water to stay hydrated. If you have frequent infections or have other conditions such as a kidney stone, you may need to see a health care provider who specializes in the urinary tract (urologist). In rare cases, urinary tract infections can cause sepsis. Sepsis is a life-threatening condition that occurs when the body responds to an infection. Sepsis is treated in the hospital with IV antibiotics, fluids, and other medicines. Follow these instructions at home:  Medicines  Take over-the-counter and prescription medicines only  as told by your health care provider.  If you were prescribed an antibiotic medicine, take it as told by your health care provider. Do not stop using the antibiotic even if you start to feel better. General instructions  Make sure you: ? Empty your bladder often and completely. Do not hold urine  for long periods of time. ? Empty your bladder after sex. ? Wipe from front to back after a bowel movement if you are female. Use each tissue one time when you wipe.  Drink enough fluid to keep your urine pale yellow.  Keep all follow-up visits as told by your health care provider. This is important. Contact a health care provider if:  Your symptoms do not get better after 1-2 days.  Your symptoms go away and then return. Get help right away if you have:  Severe pain in your back or your lower abdomen.  A fever.  Nausea or vomiting. Summary  A urinary tract infection (UTI) is an infection of any part of the urinary tract, which includes the kidneys, ureters, bladder, and urethra.  Most urinary tract infections are caused by bacteria in your genital area, around the entrance to your urinary tract (urethra).  Treatment for this condition often includes antibiotic medicines.  If you were prescribed an antibiotic medicine, take it as told by your health care provider. Do not stop using the antibiotic even if you start to feel better.  Keep all follow-up visits as told by your health care provider. This is important. This information is not intended to replace advice given to you by your health care provider. Make sure you discuss any questions you have with your health care provider. Document Released: 08/28/2005 Document Revised: 11/05/2018 Document Reviewed: 05/28/2018 Elsevier Patient Education  2020 [employer].

## 2019-06-09 LAB — URINE CULTURE
MICRO NUMBER:: 637550
SPECIMEN QUALITY:: ADEQUATE

## 2019-06-17 ENCOUNTER — Encounter: Payer: Self-pay | Admitting: Family Medicine

## 2019-06-18 MED ORDER — CIPROFLOXACIN HCL 500 MG PO TABS: 500.0000 mg | ORAL_TABLET | Freq: Two times a day (BID) | ORAL | 0 refills | Status: DC

## 2019-09-14 ENCOUNTER — Other Ambulatory Visit: Payer: Self-pay | Admitting: Obstetrics & Gynecology

## 2019-09-14 ENCOUNTER — Encounter: Payer: Self-pay | Admitting: Physician Assistant

## 2019-09-14 DIAGNOSIS — Z1231 Encounter for screening mammogram for malignant neoplasm of breast: Secondary | ICD-10-CM

## 2019-09-14 MED ORDER — LINACLOTIDE 290 MCG PO CAPS: ORAL_CAPSULE | ORAL | 3 refills | Status: DC

## 2019-09-20 MED ORDER — TRULANCE 3 MG PO TABS: 1.0000 | ORAL_TABLET | Freq: Every day | ORAL | 2 refills | Status: DC

## 2019-09-20 NOTE — Telephone Encounter (Signed)
Sent trulance to pharmacy.

## 2019-09-20 NOTE — Addendum Note (Signed)
Addended by: Donella Stade on: 09/20/2019 01:08 PM   Modules accepted: Orders

## 2019-09-20 NOTE — Telephone Encounter (Signed)
I see that the preferred is Trulance. It will not let me go thought with this PA until she has Trulance. I do not see that she has tried and failed this before. Please advise.

## 2019-09-25 ENCOUNTER — Other Ambulatory Visit: Payer: Self-pay | Admitting: Physician Assistant

## 2019-09-25 DIAGNOSIS — M62830 Muscle spasm of back: Secondary | ICD-10-CM

## 2019-09-27 ENCOUNTER — Other Ambulatory Visit: Payer: Self-pay

## 2019-09-27 ENCOUNTER — Ambulatory Visit (INDEPENDENT_AMBULATORY_CARE_PROVIDER_SITE_OTHER): Payer: BC Managed Care – PPO | Admitting: Obstetrics & Gynecology

## 2019-09-27 ENCOUNTER — Encounter: Payer: Self-pay | Admitting: Obstetrics & Gynecology

## 2019-09-27 VITALS — BP 162/98 | HR 84 | Ht 64.0 in | Wt 189.0 lb

## 2019-09-27 DIAGNOSIS — R102 Pelvic and perineal pain: Secondary | ICD-10-CM

## 2019-09-27 DIAGNOSIS — Z01411 Encounter for gynecological examination (general) (routine) with abnormal findings: Secondary | ICD-10-CM

## 2019-09-27 DIAGNOSIS — N83202 Unspecified ovarian cyst, left side: Secondary | ICD-10-CM

## 2019-09-27 MED ORDER — TRAMADOL HCL 50 MG PO TABS: 50.0000 mg | ORAL_TABLET | Freq: Four times a day (QID) | ORAL | 0 refills | Status: DC | PRN

## 2019-09-27 NOTE — Progress Notes (Signed)
Subjective:     Brittany Byrd is a 48 y.o. female here for a routine exam.  Current complaints: left pelvic pain.  Pt had similar pain in Feb and had TVUS.  3.4 simple cyst on left ovary.  Pt also saw MD and was started on Bentyl.  Her pain went away.  The pain returned 2 weeks ago and she is having to take 800 mg ibuprofen regularly.  She is unable to have intercourse.   Pt has colonoscopy next week (routine screening)   Gynecologic History Patient's last menstrual period was 01/27/2019. Contraception: none--menopausal Last Pap: 2019. Results were: normal Last mammogram: April 2019. Results were: normal  Obstetric History OB History  Gravida Para Term Preterm AB Living  0 0 0 0 0 0  SAB TAB Ectopic Multiple Live Births  0 0 0 0 0     The following portions of the patient's history were reviewed and updated as appropriate: allergies, current medications, past family history, past medical history, past social history, past surgical history and problem list.  Review of Systems Pertinent items noted in HPI and remainder of comprehensive ROS otherwise negative.    Objective:      Vitals:   09/27/19 1018  BP: (!) 162/98  Pulse: 84  Weight: 189 lb (85.7 kg)  Height: 5\' 4"  (1.626 m)   Vitals:  WNL General appearance: alert, cooperative and no distress  HEENT: Normocephalic, without obvious abnormality, atraumatic Eyes: negative Throat: lips, mucosa, and tongue normal; teeth and gums normal  Respiratory: Clear to auscultation bilaterally  CV: Regular rate and rhythm  Breasts:  Normal appearance, no masses or tenderness, no nipple retraction or dimpling  GI: Soft, non-tender; bowel sounds normal; no masses,  no organomegaly  GU: External Genitalia:  Tanner V, no lesion Urethra:  No prolapse   Vagina: Pink, normal rugae, no blood or discharge  Cervix: No CMT, no lesion  Uterus:  Normal size and contour, non tender  Adnexa: Normal, no masses, minimal tenderness with bimanual   Musculoskeletal: No edema, redness or tenderness in the calves or thighs; pain over left psoas near insertion.  Skin: No lesions or rash  Lymphatic: Axillary adenopathy: none     Psychiatric: Normal mood and behavior    Assessment:    Healthy female exam.    Plan:    1.  Pap nml in 2019--not due 2.  Yearly Mammograms 3.  TVUS (pt prefers [place]) 4.  Possible musculoskeletal issue since very lateral.  Will send note to [facility] and Dr. Darene Lamer. 5.  Tramadol for pain and continue ibuprofen

## 2019-09-27 NOTE — Progress Notes (Signed)
Last pap 02/25/18- negative Mammogram scheduled for 09/30/19 Pt c/o "pain in left ovary"

## 2019-09-28 ENCOUNTER — Ambulatory Visit (HOSPITAL_BASED_OUTPATIENT_CLINIC_OR_DEPARTMENT_OTHER)
Admission: RE | Admit: 2019-09-28 | Discharge: 2019-09-28 | Disposition: A | Discharge status: Home / Self Care | Payer: BC Managed Care – PPO | Source: Ambulatory Visit | Attending: Obstetrics & Gynecology | Admitting: Obstetrics & Gynecology

## 2019-09-28 DIAGNOSIS — N83202 Unspecified ovarian cyst, left side: Secondary | ICD-10-CM | POA: Insufficient documentation

## 2019-09-28 DIAGNOSIS — D259 Leiomyoma of uterus, unspecified: Secondary | ICD-10-CM | POA: Diagnosis not present

## 2019-09-29 ENCOUNTER — Encounter: Payer: Self-pay | Admitting: Physician Assistant

## 2019-09-29 DIAGNOSIS — I10 Essential (primary) hypertension: Secondary | ICD-10-CM

## 2019-09-29 DIAGNOSIS — R7989 Other specified abnormal findings of blood chemistry: Secondary | ICD-10-CM

## 2019-09-29 DIAGNOSIS — Z0000 Encounter for general adult medical examination without abnormal findings: Secondary | ICD-10-CM

## 2019-09-30 ENCOUNTER — Ambulatory Visit (INDEPENDENT_AMBULATORY_CARE_PROVIDER_SITE_OTHER): Payer: BC Managed Care – PPO

## 2019-09-30 ENCOUNTER — Other Ambulatory Visit: Payer: Self-pay

## 2019-09-30 DIAGNOSIS — Z1231 Encounter for screening mammogram for malignant neoplasm of breast: Secondary | ICD-10-CM

## 2019-09-30 NOTE — Telephone Encounter (Signed)
Labs pended. Please review and sign if OK

## 2019-10-04 ENCOUNTER — Ambulatory Visit (INDEPENDENT_AMBULATORY_CARE_PROVIDER_SITE_OTHER): Payer: BC Managed Care – PPO

## 2019-10-04 ENCOUNTER — Ambulatory Visit (INDEPENDENT_AMBULATORY_CARE_PROVIDER_SITE_OTHER): Payer: BC Managed Care – PPO | Admitting: Physician Assistant

## 2019-10-04 ENCOUNTER — Other Ambulatory Visit: Payer: Self-pay

## 2019-10-04 VITALS — BP 152/86 | HR 83 | Ht 60.0 in | Wt 186.0 lb

## 2019-10-04 DIAGNOSIS — R109 Unspecified abdominal pain: Secondary | ICD-10-CM

## 2019-10-04 DIAGNOSIS — Z13220 Encounter for screening for lipoid disorders: Secondary | ICD-10-CM | POA: Diagnosis not present

## 2019-10-04 DIAGNOSIS — R1032 Left lower quadrant pain: Secondary | ICD-10-CM | POA: Diagnosis not present

## 2019-10-04 DIAGNOSIS — N069 Isolated proteinuria with unspecified morphologic lesion: Secondary | ICD-10-CM | POA: Insufficient documentation

## 2019-10-04 DIAGNOSIS — Z0000 Encounter for general adult medical examination without abnormal findings: Secondary | ICD-10-CM

## 2019-10-04 DIAGNOSIS — Z131 Encounter for screening for diabetes mellitus: Secondary | ICD-10-CM | POA: Diagnosis not present

## 2019-10-04 DIAGNOSIS — R198 Other specified symptoms and signs involving the digestive system and abdomen: Secondary | ICD-10-CM

## 2019-10-04 DIAGNOSIS — R635 Abnormal weight gain: Secondary | ICD-10-CM | POA: Diagnosis not present

## 2019-10-04 DIAGNOSIS — R8271 Bacteriuria: Secondary | ICD-10-CM

## 2019-10-04 DIAGNOSIS — R822 Biliuria: Secondary | ICD-10-CM | POA: Insufficient documentation

## 2019-10-04 DIAGNOSIS — E7800 Pure hypercholesterolemia, unspecified: Secondary | ICD-10-CM

## 2019-10-04 DIAGNOSIS — R824 Acetonuria: Secondary | ICD-10-CM | POA: Insufficient documentation

## 2019-10-04 DIAGNOSIS — K529 Noninfective gastroenteritis and colitis, unspecified: Secondary | ICD-10-CM

## 2019-10-04 LAB — POCT URINALYSIS DIPSTICK
Glucose, UA: NEGATIVE
Ketones, UA: 15
Leukocytes, UA: NEGATIVE
Nitrite, UA: NEGATIVE
Protein, UA: POSITIVE — AB
Spec Grav, UA: >=1.03 — AB (ref 1.010–1.025)
Urobilinogen, UA: 0.2 E.U./dL
pH, UA: 5.5 (ref 5.0–8.0)

## 2019-10-04 MED ORDER — AMOXICILLIN-POT CLAVULANATE 875-125 MG PO TABS: 1.0000 | ORAL_TABLET | Freq: Two times a day (BID) | ORAL | 0 refills | Status: DC

## 2019-10-04 MED ORDER — IOHEXOL 300 MG/ML  SOLN
100.0000 mL | Freq: Once | INTRAMUSCULAR | Status: AC | PRN
  Administered 2019-10-04: 100 mL via INTRAVENOUS

## 2019-10-04 NOTE — Patient Instructions (Addendum)
Health Maintenance, Female Adopting a healthy lifestyle and getting preventive care are important in promoting health and wellness. Ask your health care provider about:  The right schedule for you to have regular tests and exams.  Things you can do on your own to prevent diseases and keep yourself healthy. What should I know about diet, weight, and exercise? Eat a healthy diet   Eat a diet that includes plenty of vegetables, fruits, low-fat dairy products, and lean protein.  Do not eat a lot of foods that are high in solid fats, added sugars, or sodium. Maintain a healthy weight Body mass index (BMI) is used to identify weight problems. It estimates body fat based on height and weight. Your health care provider can help determine your BMI and help you achieve or maintain a healthy weight. Get regular exercise Get regular exercise. This is one of the most important things you can do for your health. Most adults should:  Exercise for at least 150 minutes each week. The exercise should increase your heart rate and make you sweat (moderate-intensity exercise).  Do strengthening exercises at least twice a week. This is in addition to the moderate-intensity exercise.  Spend less time sitting. Even light physical activity can be beneficial. Watch cholesterol and blood lipids Have your blood tested for lipids and cholesterol at 50 years of age, then have this test every 5 years. Have your cholesterol levels checked more often if:  Your lipid or cholesterol levels are high.  You are older than 50 years of age.  You are at high risk for heart disease. What should I know about cancer screening? Depending on your health history and family history, you may need to have cancer screening at various ages. This may include screening for:  Breast cancer.  Cervical cancer.  Colorectal cancer.  Skin cancer.  Lung cancer. What should I know about heart disease, diabetes, and high blood  pressure? Blood pressure and heart disease  High blood pressure causes heart disease and increases the risk of stroke. This is more likely to develop in people who have high blood pressure readings, are of African descent, or are overweight.  Have your blood pressure checked: ? Every 3-5 years if you are 18-39 years of age. ? Every year if you are 40 years old or older. Diabetes Have regular diabetes screenings. This checks your fasting blood sugar level. Have the screening done:  Once every three years after age 40 if you are at a normal weight and have a low risk for diabetes.  More often and at a younger age if you are overweight or have a high risk for diabetes. What should I know about preventing infection? Hepatitis B If you have a higher risk for hepatitis B, you should be screened for this virus. Talk with your health care provider to find out if you are at risk for hepatitis B infection. Hepatitis C Testing is recommended for:  Everyone born from 1945 through 1965.  Anyone with known risk factors for hepatitis C. Sexually transmitted infections (STIs)  Get screened for STIs, including gonorrhea and chlamydia, if: ? You are sexually active and are younger than 50 years of age. ? You are older than 50 years of age and your health care provider tells you that you are at risk for this type of infection. ? Your sexual activity has changed since you were last screened, and you are at increased risk for chlamydia or gonorrhea. Ask your health care provider if   you are at risk.  Ask your health care provider about whether you are at high risk for HIV. Your health care provider may recommend a prescription medicine to help prevent HIV infection. If you choose to take medicine to prevent HIV, you should first get tested for HIV. You should then be tested every 3 months for as long as you are taking the medicine. Pregnancy  If you are about to stop having your period (premenopausal) and  you may become pregnant, seek counseling before you get pregnant.  Take 400 to 800 micrograms (mcg) of folic acid every day if you become pregnant.  Ask for birth control (contraception) if you want to prevent pregnancy. Osteoporosis and menopause Osteoporosis is a disease in which the bones lose minerals and strength with aging. This can result in bone fractures. If you are 65 years old or older, or if you are at risk for osteoporosis and fractures, ask your health care provider if you should:  Be screened for bone loss.  Take a calcium or vitamin D supplement to lower your risk of fractures.  Be given hormone replacement therapy (HRT) to treat symptoms of menopause. Follow these instructions at home: Lifestyle  Do not use any products that contain nicotine or tobacco, such as cigarettes, e-cigarettes, and chewing tobacco. If you need help quitting, ask your health care provider.  Do not use street drugs.  Do not share needles.  Ask your health care provider for help if you need support or information about quitting drugs. Alcohol use  Do not drink alcohol if: ? Your health care provider tells you not to drink. ? You are pregnant, may be pregnant, or are planning to become pregnant.  If you drink alcohol: ? Limit how much you use to 0-1 drink a day. ? Limit intake if you are breastfeeding.  Be aware of how much alcohol is in your drink. In the [place] one drink equals one 12 oz bottle of beer (355 mL), one 5 oz glass of wine (148 mL), or one 1 oz glass of hard liquor (44 mL). General instructions  Schedule regular health, dental, and eye exams.  Stay current with your vaccines.  Tell your health care provider if: ? You often feel depressed. ? You have ever been abused or do not feel safe at home. Summary  Adopting a healthy lifestyle and getting preventive care are important in promoting health and wellness.  Follow your health care provider's instructions about healthy  diet, exercising, and getting tested or screened for diseases.  Follow your health care provider's instructions on monitoring your cholesterol and blood pressure. This information is not intended to replace advice given to you by your health care provider. Make sure you discuss any questions you have with your health care provider. Document Released: 06/03/2011 Document Revised: 11/11/2018 Document Reviewed: 11/11/2018 Elsevier Patient Education  2020 Elsevier Inc.  

## 2019-10-04 NOTE — Progress Notes (Signed)
l Subjective:     Brittany Byrd is a 48 y.o. female and is here for a comprehensive physical exam. The patient reports problems - see below.    Patient is having some persistent left lower quadrant pain and left flank for the last month.  She had similar pain seen in January and it was found to be a cyst on her ovary.  That pain resolved and she has had no problems with pain until about a month ago. She went to GYN recently and GYN does not think new pain is related to her ovaries. She also notes a lot of bloating and gas.  Her bowel movements are great as long as she is able to take Linzess.  Left lower abdomen pain is not constant but it can be severe at times.  Movement does make it worse.  She has been taking tramadol and ibuprofen with some relief.  She has never had a kidney stone.  She denies any painful urination, change in frequency, change in urgency.  She denies any fever, chills, vomiting.  She does have some intermittent nausea.       Social History   Socioeconomic History  . Marital status: Married    Spouse name: Not on file  . Number of children: Not on file  . Years of education: Not on file  . Highest education level: Not on file  Occupational History  . Not on file  Social Needs  . Financial resource strain: Not on file  . Food insecurity    Worry: Not on file    Inability: Not on file  . Transportation needs    Medical: Not on file    Non-medical: Not on file  Tobacco Use  . Smoking status: Never Smoker  . Smokeless tobacco: Never Used  Substance and Sexual Activity  . Alcohol use: Yes    Comment: rare  . Drug use: No  . Sexual activity: Yes    Birth control/protection: None  Lifestyle  . Physical activity    Days per week: Not on file    Minutes per session: Not on file  . Stress: Not on file  Relationships  . Social Herbalist on phone: Not on file    Gets together: Not on file    Attends religious service: Not on file    Active member of  club or organization: Not on file    Attends meetings of clubs or organizations: Not on file    Relationship status: Not on file  . Intimate partner violence    Fear of current or ex partner: Not on file    Emotionally abused: Not on file    Physically abused: Not on file    Forced sexual activity: Not on file  Other Topics Concern  . Not on file  Social History Narrative  . Not on file   Health Maintenance  Topic Date Due  . COLONOSCOPY  10/06/2019 (Originally 08/01/2019)  . HIV Screening  04/21/2027 (Originally 07/31/1984)  . MAMMOGRAM  09/29/2020  . PAP SMEAR-Modifier  02/25/2021  . TETANUS/TDAP  12/23/2028  . INFLUENZA VACCINE  Completed    The following portions of the patient's history were reviewed and updated as appropriate: allergies, current medications, past family history, past medical history, past social history, past surgical history and problem list.  Review of Systems Pertinent items noted in HPI and remainder of comprehensive ROS otherwise negative.   Objective:    BP (!) 152/86  Pulse 83   Ht 5' (1.524 m)   Wt 186 lb (84.4 kg)   SpO2 98%   BMI 36.33 kg/m  General appearance: alert, cooperative, appears stated age and moderately obese Head: Normocephalic, without obvious abnormality, atraumatic Eyes: conjunctivae/corneas clear. PERRL, EOM's intact. Fundi benign. Ears: normal TM's and external ear canals both ears Nose: Nares normal. Septum midline. Mucosa normal. No drainage or sinus tenderness. Throat: lips, mucosa, and tongue normal; teeth and gums normal Neck: no adenopathy, no carotid bruit, no JVD, supple, symmetrical, trachea midline and thyroid not enlarged, symmetric, no tenderness/mass/nodules Back: symmetric, no curvature. ROM normal. No CVA tenderness. Lungs: clear to auscultation bilaterally Heart: regular rate and rhythm and systolic murmur: holosystolic 3/6, crescendo and blowing at 2nd right intercostal space Abdomen: Left Lower quadrant  pain to palpation and guarding noted on physical exam. negativ psoas sign.  Extremities: extremities normal, atraumatic, no cyanosis or edema Pulses: 2+ and symmetric Skin: Skin color, texture, turgor normal. No rashes or lesions Lymph nodes: Cervical, supraclavicular, and axillary nodes normal. Neurologic: Alert and oriented X 3, normal strength and tone. Normal symmetric reflexes. Normal coordination and gait   discomfort in left lower abdomen with externally rotate left hip.  Assessment:    Healthy female exam.      Plan:    [name] KitchenMarland KitchenDeanna was seen today for annual exam.  Diagnoses and all orders for this visit:  Routine physical examination -     Lipid Panel w/reflex Direct LDL -     COMPLETE METABOLIC PANEL WITH GFR -     TSH -     CT Abdomen Pelvis W Contrast  Left flank pain -     COMPLETE METABOLIC PANEL WITH GFR -     CT Abdomen Pelvis W Contrast -     POCT urinalysis dipstick -     Urine Culture -     Urinalysis, microscopic only  Left lower quadrant guarding -     CT Abdomen Pelvis W Contrast  Screening for lipid disorders -     Lipid Panel w/reflex Direct LDL  Screening for diabetes mellitus -     COMPLETE METABOLIC PANEL WITH GFR  Weight gain -     TSH  Bilirubinuria  Isolated proteinuria with morphologic lesion  Ketonuria  Elevated LDL cholesterol level  Bacteria in urine -     amoxicillin-clavulanate (AUGMENTIN) 875-125 MG tablet; Take 1 tablet by mouth 2 (two) times daily. For 10 days.  Colitis -     amoxicillin-clavulanate (AUGMENTIN) 875-125 MG tablet; Take 1 tablet by mouth 2 (two) times daily. For 10 days.  .. Depression screen [facility] 2/9 10/04/2019 02/25/2018 04/18/2017  Decreased Interest 1 2 0  Down, Depressed, Hopeless 1 2 0  PHQ - 2 Score 2 4 0  Altered sleeping 1 2 -  Tired, decreased energy 1 2 -  Change in appetite 1 2 -  Feeling bad or failure about yourself  1 2 -  Trouble concentrating 1 1 -  Moving slowly or fidgety/restless 0 1 -   Suicidal thoughts 0 2 -  PHQ-9 Score 7 16 -  Difficult doing work/chores Not difficult at all Very difficult -     Discussed 150 minutes of exercise a week.  Encouraged vitamin D 1000 units and Calcium 1300mg  or 4 servings of dairy a day.  Fasting labs ordered.  Mammogram is up to date.  Colonoscopy scheduled for Wednesday.  Flu shot up to date.  Pap smear up to date.  Discussed shingles will wait.   UA dipstick positive for ketones, protein, bilirubin. Will check urine culture and microscopic.  Empirically treated for UTI and possible pyelonephritis.   CT scan to evaluate for colitis vs kidney stone vs pyelonephritis vs abscess.   CT showed mild colitis in the distal colon.  augmentin has already been sent. Alert GI since has a colonoscopy scheduled for this week.   Recheck urine in 2 weeks.   See After Visit Summary for Counseling Recommendations

## 2019-10-05 ENCOUNTER — Encounter: Payer: Self-pay | Admitting: Physician Assistant

## 2019-10-05 DIAGNOSIS — R109 Unspecified abdominal pain: Secondary | ICD-10-CM | POA: Insufficient documentation

## 2019-10-05 DIAGNOSIS — E7800 Pure hypercholesterolemia, unspecified: Secondary | ICD-10-CM | POA: Insufficient documentation

## 2019-10-05 DIAGNOSIS — R198 Other specified symptoms and signs involving the digestive system and abdomen: Secondary | ICD-10-CM | POA: Insufficient documentation

## 2019-10-05 LAB — HEMOGLOBIN A1C W/OUT EAG

## 2019-10-05 LAB — LIPID PANEL W/REFLEX DIRECT LDL
Cholesterol: 246 mg/dL — ABNORMAL HIGH (ref <200)
HDL: 55 mg/dL (ref >=50)
LDL Cholesterol (Calc): 165 mg/dL (calc) — ABNORMAL HIGH
Non-HDL Cholesterol (Calc): 191 mg/dL (calc) — ABNORMAL HIGH (ref <130)
Total CHOL/HDL Ratio: 4.5 (calc) (ref <5.0)
Triglycerides: 127 mg/dL (ref <150)

## 2019-10-05 LAB — COMPLETE METABOLIC PANEL WITH GFR
AG Ratio: 1.8 (calc) (ref 1.0–2.5)
ALT: 27 U/L (ref 6–29)
AST: 21 U/L (ref 10–35)
Albumin: 4.9 g/dL (ref 3.6–5.1)
Alkaline phosphatase (APISO): 55 U/L (ref 37–153)
BUN: 13 mg/dL (ref 7–25)
CO2: 27 mmol/L (ref 20–32)
Calcium: 9.9 mg/dL (ref 8.6–10.4)
Chloride: 99 mmol/L (ref 98–110)
Creat: 0.67 mg/dL (ref 0.50–1.05)
GFR, Est African American: 119 mL/min/{1.73_m2} (ref >=60)
GFR, Est Non African American: 102 mL/min/{1.73_m2} (ref >=60)
Globulin: 2.7 g/dL (calc) (ref 1.9–3.7)
Glucose, Bld: 103 mg/dL — ABNORMAL HIGH (ref 65–99)
Potassium: 3.7 mmol/L (ref 3.5–5.3)
Sodium: 138 mmol/L (ref 135–146)
Total Bilirubin: 0.5 mg/dL (ref 0.2–1.2)
Total Protein: 7.6 g/dL (ref 6.1–8.1)

## 2019-10-05 LAB — TSH: TSH: 0.86 mIU/L

## 2019-10-05 NOTE — Progress Notes (Signed)
Call pt: she needs to let GI know about CT scan and symptoms since she has colonoscopy scheduled for Wednesday. There was some mild inflammation/infection of the short segment of the distal colon. augmentin has already been started which would help. If more inflammatory needs further work up by GI.

## 2019-10-05 NOTE — Progress Notes (Signed)
Call pt:   Brittany Byrd,   Ct showed colitis see result note. UA shows bacteria as well which could represent UTI. augmentin should cover both.   Thyroid looks great.   Kidney, liver look good.   Glucose is just a hair elevated. [name], please add A!C.   Cholesterol is NOT to goal but CV risk is still at 2.9 percent under 7.5 percent where we consider medication unless you develop diabetes where goals change.

## 2019-10-06 LAB — URINE CULTURE
MICRO NUMBER:: 1054652
Result:: NO GROWTH
SPECIMEN QUALITY:: ADEQUATE

## 2019-10-06 LAB — URINALYSIS, MICROSCOPIC ONLY: Hyaline Cast: NONE SEEN /LPF

## 2019-10-06 NOTE — Progress Notes (Signed)
Not able to add a1c. Will need to have done in office. Can follow up after augmentin is finished.

## 2019-11-08 DIAGNOSIS — R1012 Left upper quadrant pain: Secondary | ICD-10-CM | POA: Diagnosis not present

## 2019-11-08 DIAGNOSIS — R933 Abnormal findings on diagnostic imaging of other parts of digestive tract: Secondary | ICD-10-CM | POA: Diagnosis not present

## 2019-11-08 DIAGNOSIS — K5900 Constipation, unspecified: Secondary | ICD-10-CM | POA: Diagnosis not present

## 2019-11-17 ENCOUNTER — Encounter: Payer: Self-pay | Admitting: Physician Assistant

## 2019-11-17 DIAGNOSIS — R933 Abnormal findings on diagnostic imaging of other parts of digestive tract: Secondary | ICD-10-CM | POA: Diagnosis not present

## 2019-11-17 DIAGNOSIS — K6389 Other specified diseases of intestine: Secondary | ICD-10-CM | POA: Diagnosis not present

## 2019-11-17 DIAGNOSIS — R9389 Abnormal findings on diagnostic imaging of other specified body structures: Secondary | ICD-10-CM | POA: Diagnosis not present

## 2019-11-17 LAB — HM COLONOSCOPY

## 2019-12-01 ENCOUNTER — Encounter: Payer: Self-pay | Admitting: Physician Assistant

## 2019-12-07 ENCOUNTER — Encounter: Payer: Self-pay | Admitting: Physician Assistant

## 2020-02-12 ENCOUNTER — Ambulatory Visit: Payer: Self-pay | Attending: Internal Medicine

## 2020-02-12 DIAGNOSIS — Z23 Encounter for immunization: Secondary | ICD-10-CM

## 2020-02-12 NOTE — Progress Notes (Signed)
   Covid-19 Vaccination Clinic  Name:  ZAIDA WILDBERGER    MRN: [id number] DOB: 1971/05/07  02/12/2020  Ms. Kundrat was observed post Covid-19 immunization for 15 minutes without incident. She was provided with Vaccine Information Sheet and instruction to access the V-Safe system.   Ms. Aniol was instructed to call 911 with any severe reactions post vaccine: [name] Difficulty breathing  . Swelling of face and throat  . A fast heartbeat  . A bad rash all over body  . Dizziness and weakness   Immunizations Administered    Name Date Dose VIS Date Route   Pfizer COVID-19 Vaccine 02/12/2020  8:24 AM 0.3 mL 11/12/2019 Intramuscular   Manufacturer: [place]   Lot: [id number]   [place]: [id number]

## 2020-03-06 ENCOUNTER — Encounter: Payer: Self-pay | Admitting: Physician Assistant

## 2020-03-06 ENCOUNTER — Other Ambulatory Visit: Payer: Self-pay

## 2020-03-06 ENCOUNTER — Ambulatory Visit (INDEPENDENT_AMBULATORY_CARE_PROVIDER_SITE_OTHER): Payer: BC Managed Care – PPO | Admitting: Physician Assistant

## 2020-03-06 VITALS — BP 147/90 | HR 85 | Temp 97.7°F | Ht 60.0 in | Wt 197.0 lb

## 2020-03-06 DIAGNOSIS — R10A2 Flank pain, left side: Secondary | ICD-10-CM

## 2020-03-06 DIAGNOSIS — Z87440 Personal history of urinary (tract) infections: Secondary | ICD-10-CM | POA: Diagnosis not present

## 2020-03-06 DIAGNOSIS — R300 Dysuria: Secondary | ICD-10-CM

## 2020-03-06 DIAGNOSIS — R109 Unspecified abdominal pain: Secondary | ICD-10-CM

## 2020-03-06 LAB — POCT URINALYSIS DIP (CLINITEK)
Bilirubin, UA: NEGATIVE
Blood, UA: NEGATIVE
Glucose, UA: NEGATIVE mg/dL
Ketones, POC UA: NEGATIVE mg/dL
Leukocytes, UA: NEGATIVE
Nitrite, UA: NEGATIVE
POC PROTEIN,UA: NEGATIVE
Spec Grav, UA: 1.025 (ref 1.010–1.025)
Urobilinogen, UA: 0.2 E.U./dL
pH, UA: 6 (ref 5.0–8.0)

## 2020-03-06 MED ORDER — PHENAZOPYRIDINE HCL 200 MG PO TABS: 200.0000 mg | ORAL_TABLET | Freq: Three times a day (TID) | ORAL | 0 refills | Status: AC

## 2020-03-06 MED ORDER — AMOXICILLIN-POT CLAVULANATE 875-125 MG PO TABS: 1.0000 | ORAL_TABLET | Freq: Two times a day (BID) | ORAL | 0 refills | Status: AC

## 2020-03-06 MED ORDER — FLUCONAZOLE 150 MG PO TABS: 150.0000 mg | ORAL_TABLET | Freq: Once | ORAL | 0 refills | Status: AC

## 2020-03-06 NOTE — Progress Notes (Signed)
Subjective:    Patient ID: Brittany Byrd, female    DOB: May 03, 1971, 49 y.o.   MRN: [id number]  HPI  Pt is a 49 yo obese female with hx of UTI who presents to the clinic with left flank pain and dysuria for 5-6 days. She had been working in the yard and thought she pulled a muscle but then she started having more urinary symptoms and feeling "run down". She denies any fever, chills but has been nauseated. She denies any noticeable blood in urine. No hx of kidney stones. She has had normal urine dipstick before and bacteria in culture. She has been doing her usually treatment for pulled muscle and she states this feels different. No bowel changes.   .. Active Ambulatory Problems    Diagnosis Date Noted  . Hypertension   . Seasonal allergies   . Migraine   . Asthma, allergic 11/18/2012  . Epicondylitis elbow, medial 11/04/2014  . Patellofemoral syndrome 11/04/2014  . Obesity 03/11/2015  . Neuropathy 08/28/2016  . Tinnitus of right ear 08/28/2016  . PMDD (premenstrual dysphoric disorder) 01/21/2017  . Constipation 04/18/2017  . BMI 35.0-35.9,adult 04/20/2017  . Heart murmur 04/20/2017  . Uterine fibroid 01/05/2019  . Ovarian cyst 01/05/2019  . Ketonuria 10/04/2019  . Isolated proteinuria with morphologic lesion 10/04/2019  . Bilirubinuria 10/04/2019  . Elevated LDL cholesterol level 10/05/2019  . Left flank pain 10/05/2019  . Left lower quadrant guarding 10/05/2019   Resolved Ambulatory Problems    Diagnosis Date Noted  . No Resolved Ambulatory Problems   Past Medical History:  Diagnosis Date  . Uterine cancer ([place])       Review of Systems    see HPI.  Objective:   Physical Exam Vitals reviewed.  Constitutional:      Appearance: Normal appearance. She is obese.  HENT:     Head: Normocephalic.  Pulmonary:     Effort: Pulmonary effort is normal.     Breath sounds: Normal breath sounds.  Abdominal:     General: There is no distension.     Palpations: Abdomen is  soft.     Tenderness: There is left CVA tenderness. There is no right CVA tenderness or guarding.     Comments: Tenderness over suprapubic area.   Musculoskeletal:     Right lower leg: No edema.     Left lower leg: No edema.  Neurological:     General: No focal deficit present.     Mental Status: She is alert and oriented to person, place, and time.  Psychiatric:        Mood and Affect: Mood normal.           Assessment & Plan:  [name] KitchenMarland KitchenDeanna was seen today for back pain.  Diagnoses and all orders for this visit:  Left flank pain -     POCT URINALYSIS DIP (CLINITEK) -     Urine Culture -     amoxicillin-clavulanate (AUGMENTIN) 875-125 MG tablet; Take 1 tablet by mouth 2 (two) times daily for 10 days.  Dysuria -     amoxicillin-clavulanate (AUGMENTIN) 875-125 MG tablet; Take 1 tablet by mouth 2 (two) times daily for 10 days. -     phenazopyridine (PYRIDIUM) 200 MG tablet; Take 1 tablet (200 mg total) by mouth 3 (three) times daily for 2 days.  History of UTI -     amoxicillin-clavulanate (AUGMENTIN) 875-125 MG tablet; Take 1 tablet by mouth 2 (two) times daily for 10 days.  Other orders -  fluconazole (DIFLUCAN) 150 MG tablet; Take 1 tablet (150 mg total) by mouth once for 1 dose.   [name] Results for orders placed or performed in visit on 03/06/20  POCT URINALYSIS DIP (CLINITEK)  Result Value Ref Range   Color, UA yellow yellow   Clarity, UA clear clear   Glucose, UA negative negative mg/dL   Bilirubin, UA negative negative   Ketones, POC UA negative negative mg/dL   Spec Grav, UA 1.025 1.010 - 1.025   Blood, UA negative negative   pH, UA 6.0 5.0 - 8.0   POC PROTEIN,UA negative negative, trace   Urobilinogen, UA 0.2 0.2 or 1.0 E.U./dL   Nitrite, UA Negative Negative   Leukocytes, UA Negative Negative   Negative Urine dipstick. Will culture. Due to hx and symptoms will treat with augmentin due to preference in the her history. Pyridium for inflammation. Diflucan for  yeast after abx. No blood in urine less likely kidney stone.  Rest and hydrate. If culture does not grow bacteria will stop abx. Will likely treat more musculoskeletal; however with nausea and fatigue more likely infection.  Hx of colitis but no abdominal pain today.  Follow up as needed.

## 2020-03-07 ENCOUNTER — Ambulatory Visit: Payer: Self-pay | Admitting: Physician Assistant

## 2020-03-07 LAB — URINE CULTURE
MICRO NUMBER:: 10327937
SPECIMEN QUALITY:: ADEQUATE

## 2020-03-08 ENCOUNTER — Ambulatory Visit: Payer: Self-pay

## 2020-03-08 ENCOUNTER — Encounter: Payer: Self-pay | Admitting: Physician Assistant

## 2020-03-08 NOTE — Progress Notes (Signed)
Brittany Byrd,   Urine culture did not grow one bacteria but mixed bacteria. Likely this is more contamination than infection. How do you feel once starting augmentin?

## 2020-04-17 DIAGNOSIS — D2361 Other benign neoplasm of skin of right upper limb, including shoulder: Secondary | ICD-10-CM | POA: Diagnosis not present

## 2020-04-17 DIAGNOSIS — D485 Neoplasm of uncertain behavior of skin: Secondary | ICD-10-CM | POA: Diagnosis not present

## 2020-05-08 ENCOUNTER — Other Ambulatory Visit: Payer: Self-pay | Admitting: Obstetrics & Gynecology

## 2020-05-08 MED ORDER — SERTRALINE HCL 25 MG PO TABS: 25.0000 mg | ORAL_TABLET | Freq: Every day | ORAL | 6 refills | Status: DC

## 2020-05-08 NOTE — Progress Notes (Signed)
Pt would like to resume zoloft.  Job stress is increasing and she is finding herself overwhelmed and irritable.  These symptoms were better when she was on Zoloft.  Will prescribe 25 mg daily. And can increase to 50 mg if needed.

## 2020-06-23 ENCOUNTER — Encounter: Payer: Self-pay | Admitting: Physician Assistant

## 2020-06-30 ENCOUNTER — Other Ambulatory Visit: Payer: Self-pay | Admitting: Obstetrics & Gynecology

## 2020-07-05 IMAGING — MG DIGITAL SCREENING BILAT W/ TOMO
8 series · 8 of 24 positions shown · non-contrast
Comparison: Previous exam(s).

CLINICAL DATA: Screening.

EXAM:
DIGITAL SCREENING BILATERAL MAMMOGRAM WITH TOMO AND CAD

[L CC synth-2D]
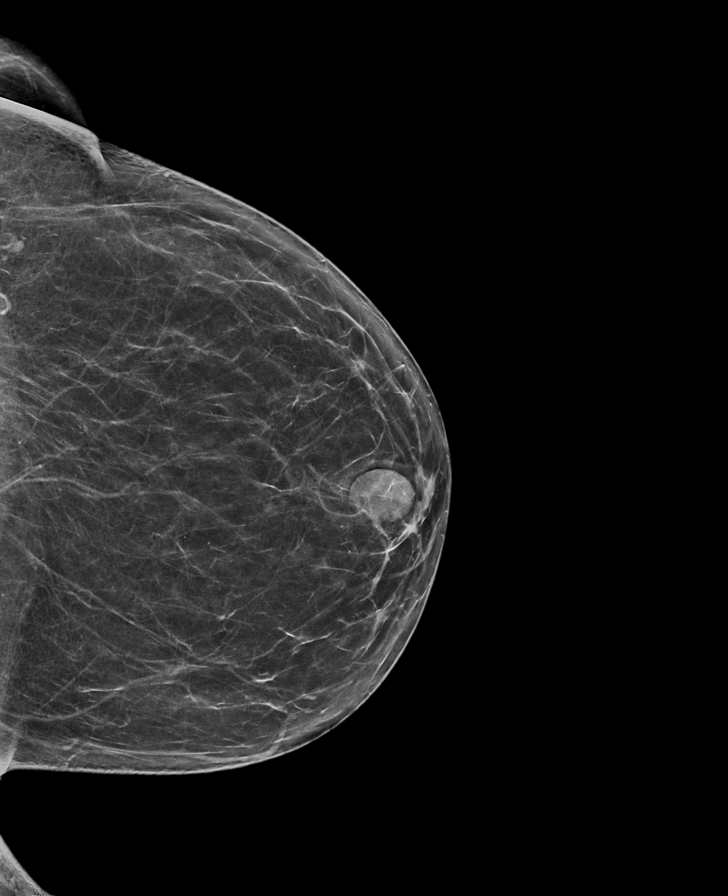

[L MLO synth-2D]
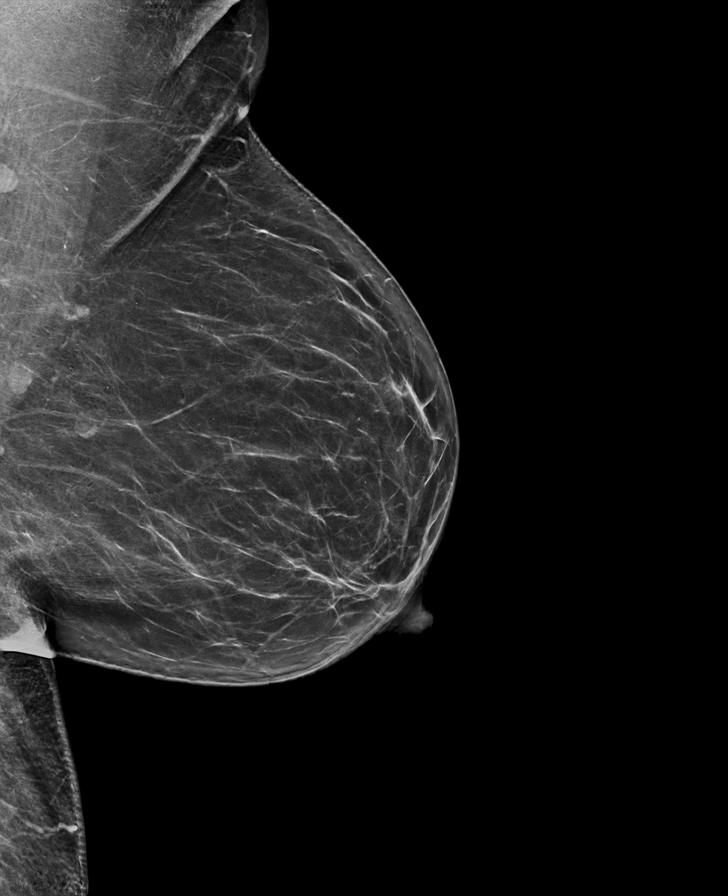

[R MLO synth-2D]
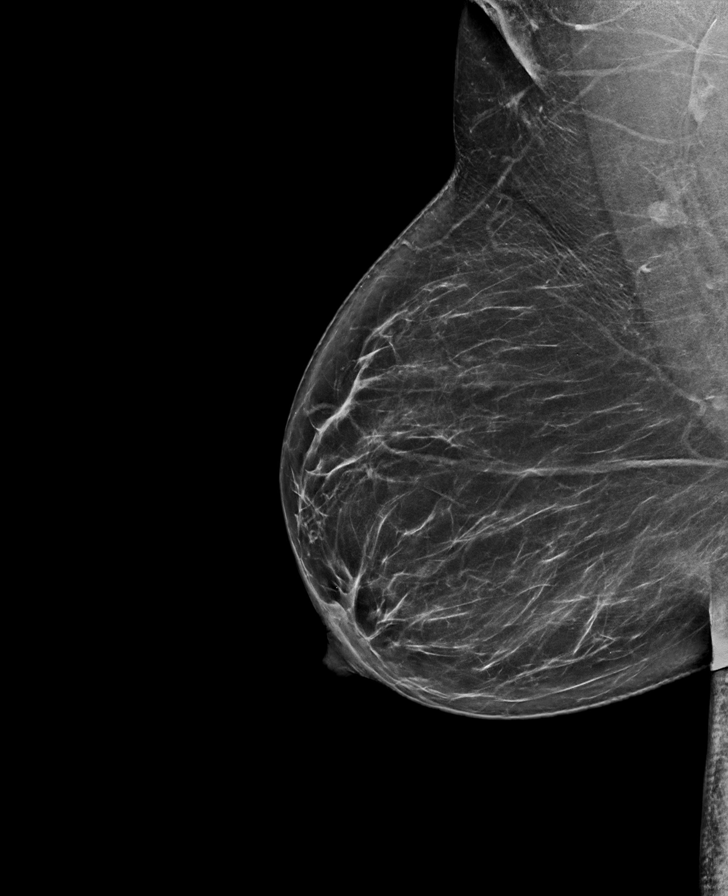

[R CC synth-2D]
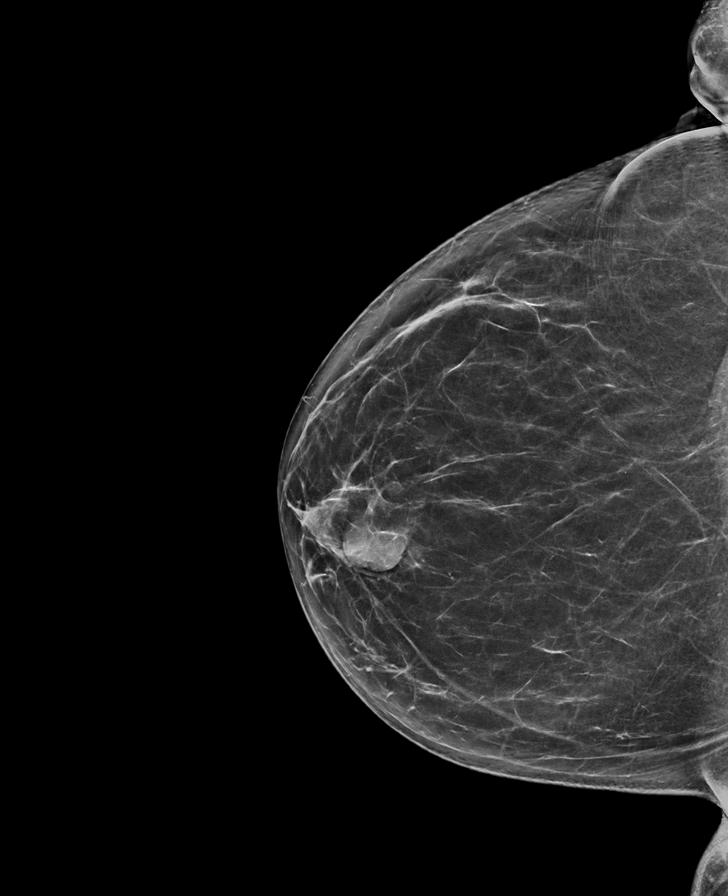

[R CC tomo · tomo slice 35/70.0]
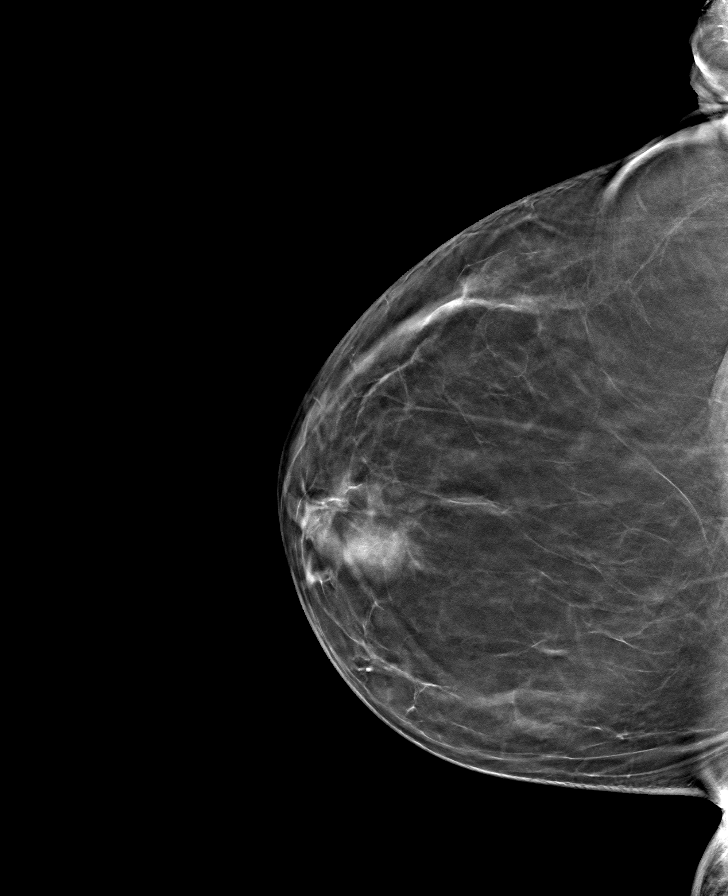

[R MLO tomo · tomo slice 39/76.0]
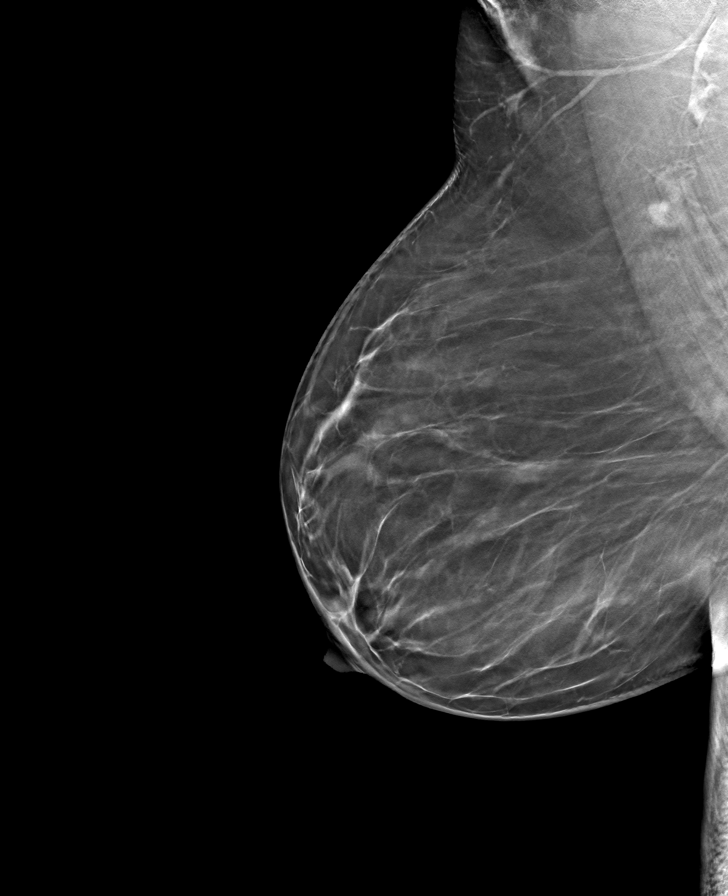

[L MLO tomo · tomo slice 37/74.0]
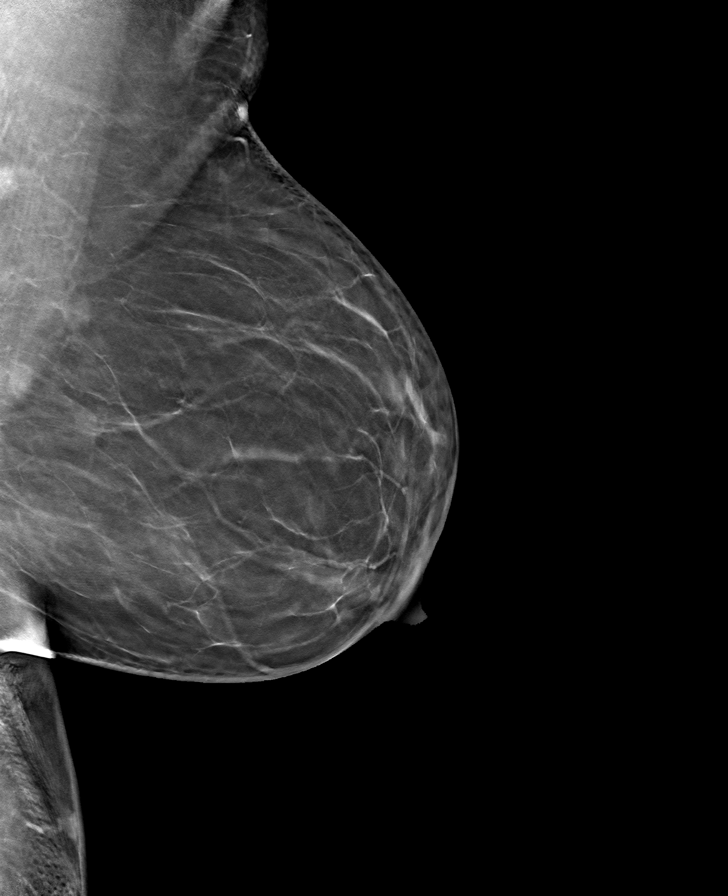

[L CC tomo · tomo slice 33/64.0]
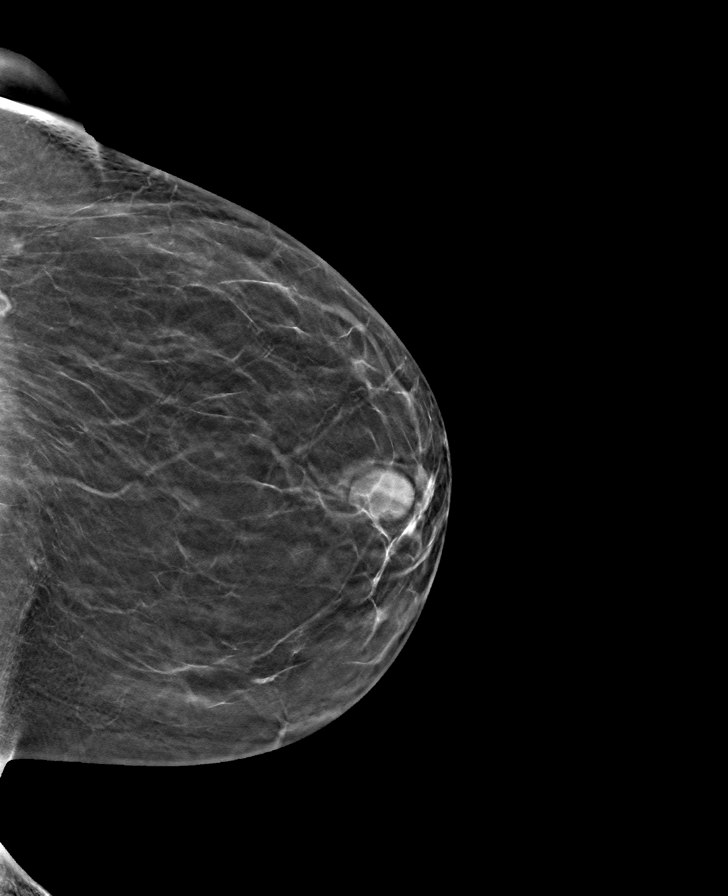

[8 of 24 positions shown; findings below may reference images not displayed]

ACR Breast Density Category b: There are scattered areas of
fibroglandular density.
FINDINGS: There are no findings suspicious for malignancy. Images were
processed with CAD.
IMPRESSION: No mammographic evidence of malignancy. A result letter of this
screening mammogram will be mailed directly to the patient.

RECOMMENDATION:
Screening mammogram in one year. (Code:[id number])

BI-RADS CATEGORY  1: Negative.

## 2020-09-06 ENCOUNTER — Encounter: Payer: Self-pay | Admitting: Physician Assistant

## 2020-09-06 NOTE — Telephone Encounter (Signed)
Handled in patient's chart. Appt today

## 2020-12-05 ENCOUNTER — Encounter: Payer: BC Managed Care – PPO | Admitting: Physician Assistant

## 2020-12-15 ENCOUNTER — Other Ambulatory Visit: Payer: Self-pay

## 2020-12-15 ENCOUNTER — Encounter: Payer: Self-pay | Admitting: Physician Assistant

## 2020-12-15 ENCOUNTER — Ambulatory Visit (INDEPENDENT_AMBULATORY_CARE_PROVIDER_SITE_OTHER): Payer: BC Managed Care – PPO | Admitting: Physician Assistant

## 2020-12-15 VITALS — BP 168/78 | HR 85 | Ht 60.0 in | Wt 203.0 lb

## 2020-12-15 DIAGNOSIS — F339 Major depressive disorder, recurrent, unspecified: Secondary | ICD-10-CM

## 2020-12-15 DIAGNOSIS — Z0000 Encounter for general adult medical examination without abnormal findings: Secondary | ICD-10-CM | POA: Diagnosis not present

## 2020-12-15 DIAGNOSIS — R0789 Other chest pain: Secondary | ICD-10-CM

## 2020-12-15 DIAGNOSIS — R030 Elevated blood-pressure reading, without diagnosis of hypertension: Secondary | ICD-10-CM

## 2020-12-15 DIAGNOSIS — Z1231 Encounter for screening mammogram for malignant neoplasm of breast: Secondary | ICD-10-CM

## 2020-12-15 DIAGNOSIS — R9431 Abnormal electrocardiogram [ECG] [EKG]: Secondary | ICD-10-CM | POA: Diagnosis not present

## 2020-12-15 DIAGNOSIS — J4521 Mild intermittent asthma with (acute) exacerbation: Secondary | ICD-10-CM | POA: Diagnosis not present

## 2020-12-15 DIAGNOSIS — E7800 Pure hypercholesterolemia, unspecified: Secondary | ICD-10-CM

## 2020-12-15 DIAGNOSIS — Z1159 Encounter for screening for other viral diseases: Secondary | ICD-10-CM

## 2020-12-15 MED ORDER — SERTRALINE HCL 50 MG PO TABS: 50.0000 mg | ORAL_TABLET | Freq: Every day | ORAL | 0 refills | Status: DC

## 2020-12-15 NOTE — Progress Notes (Signed)
Subjective:     Brittany Byrd is a 50 y.o. female and is here for a comprehensive physical exam. The patient reports problems - she is having a lot of problems feeling stressed and worried about health. 3 people at her job died suddenly and then covid with so many sick. she just feels overwhelmed. she has gained all the weight back she lost with diet and exercise.  she did start her zoloft back but has not even been a week yet. No SI/HC.    Social History   Socioeconomic History  . Marital status: Married    Spouse name: Not on file  . Number of children: Not on file  . Years of education: Not on file  . Highest education level: Not on file  Occupational History  . Not on file  Tobacco Use  . Smoking status: Never Smoker  . Smokeless tobacco: Never Used  Substance and Sexual Activity  . Alcohol use: Yes    Comment: rare  . Drug use: No  . Sexual activity: Yes    Birth control/protection: None  Other Topics Concern  . Not on file  Social History Narrative  . Not on file   Social Determinants of Health   Financial Resource Strain: Not on file  Food Insecurity: Not on file  Transportation Needs: Not on file  Physical Activity: Not on file  Stress: Not on file  Social Connections: Not on file  Intimate Partner Violence: Not on file   Health Maintenance  Topic Date Due  . Hepatitis C Screening  Never done  . INFLUENZA VACCINE  03/01/2021 (Originally 07/02/2020)  . MAMMOGRAM  01/29/2022 (Originally 09/29/2020)  . HIV Screening  04/21/2027 (Originally 07/31/1984)  . PAP SMEAR-Modifier  02/26/2023  . TETANUS/TDAP  12/23/2028  . COLONOSCOPY (Pts 45-39yrs Insurance coverage will need to be confirmed)  11/16/2029  . COVID-19 Vaccine  Completed    The following portions of the patient's history were reviewed and updated as appropriate: allergies, current medications, past family history, past medical history, past social history, past surgical history and problem  list.  Review of Systems Pertinent items noted in HPI and remainder of comprehensive ROS otherwise negative.   Objective:    BP (!) 168/78   Pulse 85   Ht 5' (1.524 m)   Wt 203 lb (92.1 kg)   SpO2 97%   BMI 39.65 kg/m  General appearance: alert, cooperative, appears stated age and morbidly obese Head: Normocephalic, without obvious abnormality, atraumatic Eyes: conjunctivae/corneas clear. PERRL, EOM's intact. Fundi benign. Ears: normal TM's and external ear canals both ears Nose: Nares normal. Septum midline. Mucosa normal. No drainage or sinus tenderness. Throat: lips, mucosa, and tongue normal; teeth and gums normal Neck: no adenopathy, no carotid bruit, no JVD, supple, symmetrical, trachea midline and thyroid not enlarged, symmetric, no tenderness/mass/nodules Back: symmetric, no curvature. ROM normal. No CVA tenderness. Lungs: clear to auscultation bilaterally Heart: regular rate and rhythm, S1, S2 normal, no murmur, click, rub or gallop Abdomen: soft, non-tender; bowel sounds normal; no masses,  no organomegaly Extremities: extremities normal, atraumatic, no cyanosis or edema Pulses: 2+ and symmetric Skin: Skin color, texture, turgor normal. No rashes or lesions Lymph nodes: Cervical, supraclavicular, and axillary nodes normal. Neurologic: Alert and oriented X 3, normal strength and tone. Normal symmetric reflexes. Normal coordination and gait    Assessment:    Healthy female exam.      Plan:      [name] KitchenMarland KitchenDiagnoses and all orders for  this visit:  Routine physical examination -     MM 3D SCREEN BREAST BILATERAL -     Hepatitis C Antibody -     Lipid Panel w/reflex Direct LDL -     COMPLETE METABOLIC PANEL WITH GFR -     TSH -     CBC with Differential/Platelet  Morbid obesity (HCC) -     TSH -     CBC with Differential/Platelet -     Exercise Tolerance Test -     Cardiac Stress Test: Informed Consent Details: Physician/Practitioner Attestation; Transcribe to consent  form and obtain patient signature  Mild intermittent extrinsic asthma with acute exacerbation  Elevated LDL cholesterol level -     Lipid Panel w/reflex Direct LDL  Visit for screening mammogram -     MM 3D SCREEN BREAST BILATERAL  Encounter for hepatitis C screening test for low risk patient -     Hepatitis C Antibody  Recurrent major depressive disorder, remission status unspecified (HCC) -     sertraline (ZOLOFT) 50 MG tablet; Take 1 tablet (50 mg total) by mouth daily.  Elevated blood pressure reading -     Exercise Tolerance Test -     Cardiac Stress Test: Informed Consent Details: Physician/Practitioner Attestation; Transcribe to consent form and obtain patient signature  Abnormal EKG -     Exercise Tolerance Test -     Cardiac Stress Test: Informed Consent Details: Physician/Practitioner Attestation; Transcribe to consent form and obtain patient signature  Chest discomfort -     Exercise Tolerance Test -     Cardiac Stress Test: Informed Consent Details: Physician/Practitioner Attestation; Transcribe to consent form and obtain patient signature   .[name] Discussed 150 minutes of exercise a week.  Encouraged vitamin D 1000 units and Calcium 1300mg  or 4 servings of dairy a day.  Fasting labs ordered.  No need for pap.  Mammogram ordered.  Colonoscopy up to date.  covid vaccine done.  Discussed shingles. Will wait.  Declined flu shot.    Screening EKG due over 50 and concerns about heart and weight.  EKG: NSR, small ST elevation in lead III and aVF. Would like to do stress test.  BP elevated and not to goal. Pt wants to keep log at home before starting medication. Follow up in 4 weeks.   [name].Discussed low carb diet with 1500 calories and 80g of protein.  Exercising at least 150 minutes a week.  My Fitness Pal could be a Microbiologist.    Increased zoloft to 50mg  for mood.  Follow up in 1 month.   See After Visit Summary for Counseling Recommendations

## 2020-12-15 NOTE — Patient Instructions (Signed)
Health Maintenance, Female Adopting a healthy lifestyle and getting preventive care are important in promoting health and wellness. Ask your health care provider about:  The right schedule for you to have regular tests and exams.  Things you can do on your own to prevent diseases and keep yourself healthy. What should I know about diet, weight, and exercise? Eat a healthy diet  Eat a diet that includes plenty of vegetables, fruits, low-fat dairy products, and lean protein.  Do not eat a lot of foods that are high in solid fats, added sugars, or sodium.   Maintain a healthy weight Body mass index (BMI) is used to identify weight problems. It estimates body fat based on height and weight. Your health care provider can help determine your BMI and help you achieve or maintain a healthy weight. Get regular exercise Get regular exercise. This is one of the most important things you can do for your health. Most adults should:  Exercise for at least 150 minutes each week. The exercise should increase your heart rate and make you sweat (moderate-intensity exercise).  Do strengthening exercises at least twice a week. This is in addition to the moderate-intensity exercise.  Spend less time sitting. Even light physical activity can be beneficial. Watch cholesterol and blood lipids Have your blood tested for lipids and cholesterol at 52 years of age, then have this test every 5 years. Have your cholesterol levels checked more often if:  Your lipid or cholesterol levels are high.  You are older than 52 years of age.  You are at high risk for heart disease. What should I know about cancer screening? Depending on your health history and family history, you may need to have cancer screening at various ages. This may include screening for:  Breast cancer.  Cervical cancer.  Colorectal cancer.  Skin cancer.  Lung cancer. What should I know about heart disease, diabetes, and high blood  pressure? Blood pressure and heart disease  High blood pressure causes heart disease and increases the risk of stroke. This is more likely to develop in people who have high blood pressure readings, are of African descent, or are overweight.  Have your blood pressure checked: ? Every 3-5 years if you are 18-39 years of age. ? Every year if you are 40 years old or older. Diabetes Have regular diabetes screenings. This checks your fasting blood sugar level. Have the screening done:  Once every three years after age 40 if you are at a normal weight and have a low risk for diabetes.  More often and at a younger age if you are overweight or have a high risk for diabetes. What should I know about preventing infection? Hepatitis B If you have a higher risk for hepatitis B, you should be screened for this virus. Talk with your health care provider to find out if you are at risk for hepatitis B infection. Hepatitis C Testing is recommended for:  Everyone born from 1945 through 1965.  Anyone with known risk factors for hepatitis C. Sexually transmitted infections (STIs)  Get screened for STIs, including gonorrhea and chlamydia, if: ? You are sexually active and are younger than 52 years of age. ? You are older than 52 years of age and your health care provider tells you that you are at risk for this type of infection. ? Your sexual activity has changed since you were last screened, and you are at increased risk for chlamydia or gonorrhea. Ask your health care provider   if you are at risk.  Ask your health care provider about whether you are at high risk for HIV. Your health care provider may recommend a prescription medicine to help prevent HIV infection. If you choose to take medicine to prevent HIV, you should first get tested for HIV. You should then be tested every 3 months for as long as you are taking the medicine. Pregnancy  If you are about to stop having your period (premenopausal) and  you may become pregnant, seek counseling before you get pregnant.  Take 400 to 800 micrograms (mcg) of folic acid every day if you become pregnant.  Ask for birth control (contraception) if you want to prevent pregnancy. Osteoporosis and menopause Osteoporosis is a disease in which the bones lose minerals and strength with aging. This can result in bone fractures. If you are 65 years old or older, or if you are at risk for osteoporosis and fractures, ask your health care provider if you should:  Be screened for bone loss.  Take a calcium or vitamin D supplement to lower your risk of fractures.  Be given hormone replacement therapy (HRT) to treat symptoms of menopause. Follow these instructions at home: Lifestyle  Do not use any products that contain nicotine or tobacco, such as cigarettes, e-cigarettes, and chewing tobacco. If you need help quitting, ask your health care provider.  Do not use street drugs.  Do not share needles.  Ask your health care provider for help if you need support or information about quitting drugs. Alcohol use  Do not drink alcohol if: ? Your health care provider tells you not to drink. ? You are pregnant, may be pregnant, or are planning to become pregnant.  If you drink alcohol: ? Limit how much you use to 0-1 drink a day. ? Limit intake if you are breastfeeding.  Be aware of how much alcohol is in your drink. In the [place] one drink equals one 12 oz bottle of beer (355 mL), one 5 oz glass of wine (148 mL), or one 1 oz glass of hard liquor (44 mL). General instructions  Schedule regular health, dental, and eye exams.  Stay current with your vaccines.  Tell your health care provider if: ? You often feel depressed. ? You have ever been abused or do not feel safe at home. Summary  Adopting a healthy lifestyle and getting preventive care are important in promoting health and wellness.  Follow your health care provider's instructions about healthy  diet, exercising, and getting tested or screened for diseases.  Follow your health care provider's instructions on monitoring your cholesterol and blood pressure. This information is not intended to replace advice given to you by your health care provider. Make sure you discuss any questions you have with your health care provider. Document Revised: 11/11/2018 Document Reviewed: 11/11/2018 Elsevier Patient Education  2021 Elsevier Inc.  

## 2020-12-19 ENCOUNTER — Encounter: Payer: Self-pay | Admitting: Physician Assistant

## 2020-12-19 ENCOUNTER — Other Ambulatory Visit: Payer: Self-pay | Admitting: Physician Assistant

## 2020-12-19 MED ORDER — LOSARTAN POTASSIUM 25 MG PO TABS: 25.0000 mg | ORAL_TABLET | Freq: Every day | ORAL | 0 refills | Status: DC

## 2020-12-19 NOTE — Addendum Note (Signed)
Addended by: Narda Rutherford on: 12/19/2020 09:00 AM   Modules accepted: Orders

## 2020-12-19 NOTE — Progress Notes (Signed)
Sent cozaar.

## 2021-01-01 ENCOUNTER — Telehealth (HOSPITAL_COMMUNITY): Payer: Self-pay | Admitting: Physician Assistant

## 2021-01-01 NOTE — Telephone Encounter (Signed)
Called patient to schedule GXT and she does not wishto schedule at this time due to COVID testing required. Order will be removed from the [facility] and when patient calls back to reschedule we will reinstate the order. Thank you.

## 2021-01-10 ENCOUNTER — Other Ambulatory Visit: Payer: Self-pay | Admitting: Physician Assistant

## 2021-01-10 DIAGNOSIS — F339 Major depressive disorder, recurrent, unspecified: Secondary | ICD-10-CM

## 2021-01-24 ENCOUNTER — Other Ambulatory Visit: Payer: Self-pay

## 2021-01-24 ENCOUNTER — Ambulatory Visit (INDEPENDENT_AMBULATORY_CARE_PROVIDER_SITE_OTHER): Payer: BC Managed Care – PPO

## 2021-01-24 DIAGNOSIS — Z1231 Encounter for screening mammogram for malignant neoplasm of breast: Secondary | ICD-10-CM | POA: Diagnosis not present

## 2021-01-24 DIAGNOSIS — Z0000 Encounter for general adult medical examination without abnormal findings: Secondary | ICD-10-CM

## 2021-01-29 NOTE — Progress Notes (Signed)
Normal mammogram. Follow up in 1 year.

## 2021-02-09 ENCOUNTER — Other Ambulatory Visit: Payer: Self-pay | Admitting: Physician Assistant

## 2021-02-09 DIAGNOSIS — F339 Major depressive disorder, recurrent, unspecified: Secondary | ICD-10-CM

## 2021-03-12 ENCOUNTER — Other Ambulatory Visit: Payer: Self-pay | Admitting: Physician Assistant

## 2021-06-07 ENCOUNTER — Encounter: Payer: Self-pay | Admitting: Physician Assistant

## 2021-06-08 DIAGNOSIS — Z1152 Encounter for screening for COVID-19: Secondary | ICD-10-CM | POA: Diagnosis not present

## 2021-06-08 DIAGNOSIS — Z20822 Contact with and (suspected) exposure to covid-19: Secondary | ICD-10-CM | POA: Diagnosis not present

## 2021-06-08 DIAGNOSIS — R430 Anosmia: Secondary | ICD-10-CM | POA: Diagnosis not present

## 2021-06-13 ENCOUNTER — Other Ambulatory Visit: Payer: Self-pay | Admitting: Physician Assistant

## 2021-06-13 ENCOUNTER — Encounter: Payer: Self-pay | Admitting: Physician Assistant

## 2021-06-15 NOTE — Telephone Encounter (Signed)
FYI. Do you already have fax number?

## 2021-07-11 ENCOUNTER — Other Ambulatory Visit: Payer: Self-pay | Admitting: Physician Assistant

## 2021-07-29 ENCOUNTER — Other Ambulatory Visit: Payer: Self-pay | Admitting: Physician Assistant

## 2021-10-29 ENCOUNTER — Other Ambulatory Visit: Payer: Self-pay

## 2021-10-29 ENCOUNTER — Ambulatory Visit (INDEPENDENT_AMBULATORY_CARE_PROVIDER_SITE_OTHER): Payer: BC Managed Care – PPO | Admitting: Physician Assistant

## 2021-10-29 ENCOUNTER — Encounter: Payer: Self-pay | Admitting: Physician Assistant

## 2021-10-29 VITALS — BP 163/82 | HR 89 | Temp 98.1°F | Ht 60.0 in | Wt 200.0 lb

## 2021-10-29 DIAGNOSIS — E6609 Other obesity due to excess calories: Secondary | ICD-10-CM

## 2021-10-29 DIAGNOSIS — F339 Major depressive disorder, recurrent, unspecified: Secondary | ICD-10-CM | POA: Diagnosis not present

## 2021-10-29 DIAGNOSIS — R7301 Impaired fasting glucose: Secondary | ICD-10-CM

## 2021-10-29 DIAGNOSIS — Z0000 Encounter for general adult medical examination without abnormal findings: Secondary | ICD-10-CM

## 2021-10-29 DIAGNOSIS — E7800 Pure hypercholesterolemia, unspecified: Secondary | ICD-10-CM

## 2021-10-29 DIAGNOSIS — Z6839 Body mass index (BMI) 39.0-39.9, adult: Secondary | ICD-10-CM

## 2021-10-29 DIAGNOSIS — I10 Essential (primary) hypertension: Secondary | ICD-10-CM

## 2021-10-29 DIAGNOSIS — Z1159 Encounter for screening for other viral diseases: Secondary | ICD-10-CM | POA: Diagnosis not present

## 2021-10-29 MED ORDER — SAXENDA 18 MG/3ML ~~LOC~~ SOPN: 3.0000 mg | PEN_INJECTOR | Freq: Every day | SUBCUTANEOUS | 1 refills | Status: DC

## 2021-10-29 MED ORDER — LOSARTAN POTASSIUM 25 MG PO TABS
25.0000 mg | ORAL_TABLET | Freq: Every day | ORAL | 1 refills | Status: DC
Start: 2021-10-29 — End: 2022-04-22

## 2021-10-29 NOTE — Progress Notes (Signed)
Subjective:    Patient ID: Brittany Byrd, female    DOB: 08/15/1971, 50 y.o.   MRN: [id number]  HPI Pt is a 50 yo obese female with hypertension, asthma, elevated fasting glucose who presents to the clinic for follow-up.  Patient knows she needs to monitor her health more closely.  She does need a refill of Cozaar and has been out of it for about a week.  She denies any chest pain, palpitations, headaches or vision changes.  She is not doing what she needs to do with exercise and eating.  She continues to gain weight.  She does admit she has more overwhelmed and stressed.  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  She wants to get back feeling a little healthier.    .. Active Ambulatory Problems    Diagnosis Date Noted   Hypertension    Seasonal allergies    Migraine    Asthma, allergic 11/18/2012   Epicondylitis elbow, medial 11/04/2014   Patellofemoral syndrome 11/04/2014   Class 2 obesity due to excess calories without serious comorbidity with body mass index (BMI) of 39.0 to 39.9 in adult 03/11/2015   Neuropathy 08/28/2016   Tinnitus of right ear 08/28/2016   PMDD (premenstrual dysphoric disorder) 01/21/2017   Constipation 04/18/2017   BMI 35.0-35.9,adult 04/20/2017   Heart murmur 04/20/2017   Uterine fibroid 01/05/2019   Ovarian cyst 01/05/2019   Ketonuria 10/04/2019   Isolated proteinuria with morphologic lesion 10/04/2019   Bilirubinuria 10/04/2019   Elevated LDL cholesterol level 10/05/2019   Left flank pain 10/05/2019   Left lower quadrant guarding 10/05/2019   Recurrent major depressive disorder ([place]) 12/15/2020   Hyperlipidemia 10/30/2021   Elevated fasting glucose 10/30/2021   Resolved Ambulatory Problems    Diagnosis Date Noted   No Resolved Ambulatory Problems   Past Medical History:  Diagnosis Date   Uterine cancer ([place])      Review of Systems    See HPI.  Objective:   Physical  Exam Vitals reviewed.  Constitutional:      Appearance: Normal appearance. She is obese.  HENT:     Head: Normocephalic.  Neck:     Vascular: No carotid bruit.  Cardiovascular:     Rate and Rhythm: Normal rate and regular rhythm.     Heart sounds: Murmur heard.  Pulmonary:     Effort: Pulmonary effort is normal.     Breath sounds: Normal breath sounds.  Neurological:     General: No focal deficit present.     Mental Status: She is alert and oriented to person, place, and time.  Psychiatric:        Mood and Affect: Mood normal.      .. Depression screen [facility] 2/9 12/15/2020 10/04/2019 02/25/2018 04/18/2017  Decreased Interest 2 1 2  0  Down, Depressed, Hopeless 2 1 2  0  PHQ - 2 Score 4 2 4  0  Altered sleeping 1 1 2  -  Tired, decreased energy 3 1 2  -  Change in appetite 3 1 2  -  Feeling bad or failure about yourself  3 1 2  -  Trouble concentrating 2 1 1  -  Moving slowly or fidgety/restless 1 0 1 -  Suicidal thoughts 0 0 2 -  PHQ-9 Score 17 7 16  -  Difficult doing work/chores Somewhat difficult Not difficult at all Very difficult -       Assessment & Plan:  [name] KitchenMarland KitchenDeanna was  seen today for hypertension.  Diagnoses and all orders for this visit:  Routine physical examination -     Lipid Panel w/reflex Direct LDL -     Hepatitis C Antibody -     COMPLETE METABOLIC PANEL WITH GFR -     TSH -     CBC with Differential/Platelet  Recurrent major depressive disorder, remission status unspecified (HCC)  Elevated LDL cholesterol level -     Lipid Panel w/reflex Direct LDL  Encounter for hepatitis C screening test for low risk patient -     Hepatitis C Antibody  Pure hypercholesterolemia  Elevated fasting glucose  Primary hypertension -     losartan (COZAAR) 25 MG tablet; Take 1 tablet (25 mg total) by mouth daily.  Class 2 obesity due to excess calories without serious comorbidity with body mass index (BMI) of 39.0 to 39.9 in adult -     Liraglutide -Weight Management  (SAXENDA) 18 MG/3ML SOPN; Inject 3 mg into the skin daily. 0.6 mg inj subcut daily for 1 week, then incr by 0.6 mg weekly until reaching 3 mg injected subcut daily  Needs labs.  BP is elevated but has not had cozaar. Refilled. Start checking BP at home.   [name].Discussed low carb diet with 1500 calories and 80g of protein.  Exercising at least 150 minutes a week.  My Fitness Pal could be a Microbiologist.  Start saxenda.  Discussed side effects and taper up.  Follow up in 3 months.

## 2021-10-30 ENCOUNTER — Encounter: Payer: Self-pay | Admitting: Physician Assistant

## 2021-10-30 DIAGNOSIS — R7301 Impaired fasting glucose: Secondary | ICD-10-CM | POA: Insufficient documentation

## 2021-10-30 DIAGNOSIS — E785 Hyperlipidemia, unspecified: Secondary | ICD-10-CM | POA: Insufficient documentation

## 2021-10-30 LAB — CBC WITH DIFFERENTIAL/PLATELET
Absolute Monocytes: 346 cells/uL (ref 200–950)
Basophils Absolute: 50 cells/uL (ref 0–200)
Basophils Relative: 0.7 %
Eosinophils Absolute: 194 cells/uL (ref 15–500)
Eosinophils Relative: 2.7 %
HCT: 42.6 % (ref 35.0–45.0)
Hemoglobin: 13.8 g/dL (ref 11.7–15.5)
Lymphs Abs: 3010 cells/uL (ref 850–3900)
MCH: 26.2 pg — ABNORMAL LOW (ref 27.0–33.0)
MCHC: 32.4 g/dL (ref 32.0–36.0)
MCV: 80.8 fL (ref 80.0–100.0)
MPV: 9.6 fL (ref 7.5–12.5)
Monocytes Relative: 4.8 %
Neutro Abs: 3600 cells/uL (ref 1500–7800)
Neutrophils Relative %: 50 %
Platelets: 307 10*3/uL (ref 140–400)
RBC: 5.27 10*6/uL — ABNORMAL HIGH (ref 3.80–5.10)
RDW: 13.8 % (ref 11.0–15.0)
Total Lymphocyte: 41.8 %
WBC: 7.2 10*3/uL (ref 3.8–10.8)

## 2021-10-30 LAB — COMPLETE METABOLIC PANEL WITH GFR
AG Ratio: 1.5 (calc) (ref 1.0–2.5)
ALT: 21 U/L (ref 6–29)
AST: 19 U/L (ref 10–35)
Albumin: 4.8 g/dL (ref 3.6–5.1)
Alkaline phosphatase (APISO): 60 U/L (ref 37–153)
BUN: 14 mg/dL (ref 7–25)
CO2: 29 mmol/L (ref 20–32)
Calcium: 9.8 mg/dL (ref 8.6–10.4)
Chloride: 104 mmol/L (ref 98–110)
Creat: 0.7 mg/dL (ref 0.50–1.03)
Globulin: 3.2 g/dL (calc) (ref 1.9–3.7)
Glucose, Bld: 114 mg/dL — ABNORMAL HIGH (ref 65–99)
Potassium: 4.3 mmol/L (ref 3.5–5.3)
Sodium: 142 mmol/L (ref 135–146)
Total Bilirubin: 0.4 mg/dL (ref 0.2–1.2)
Total Protein: 8 g/dL (ref 6.1–8.1)
eGFR: 104 mL/min/{1.73_m2} (ref >=60)

## 2021-10-30 LAB — TSH: TSH: 1.52 mIU/L

## 2021-10-30 LAB — LIPID PANEL W/REFLEX DIRECT LDL
Cholesterol: 253 mg/dL — ABNORMAL HIGH (ref <200)
HDL: 57 mg/dL (ref >=50)
LDL Cholesterol (Calc): 171 mg/dL (calc) — ABNORMAL HIGH
Non-HDL Cholesterol (Calc): 196 mg/dL (calc) — ABNORMAL HIGH (ref <130)
Total CHOL/HDL Ratio: 4.4 (calc) (ref <5.0)
Triglycerides: 122 mg/dL (ref <150)

## 2021-10-30 LAB — HEPATITIS C ANTIBODY
Hepatitis C Ab: NONREACTIVE
SIGNAL TO CUT-OFF: 0.05 (ref <1.00)

## 2021-10-30 NOTE — Progress Notes (Signed)
Danaja,   Elevated fasting glucose. Please add A1C to evaluate for diabetes.  Thyroid looks great.  LDL increased more.  HDL does look good.  I would strongly consider a statin to lower your cholesterol and decrease your overall cardiovascular risk. Are you ok with starting? Weigh loss and low fat/fried/processed food diet can really help.

## 2021-10-31 ENCOUNTER — Telehealth: Payer: Self-pay

## 2021-10-31 NOTE — Telephone Encounter (Signed)
Medication: Liraglutide -Weight Management ([place]) 18 MG/3ML SOPN Prior authorization submitted via CoverMyMeds on 10/31/2021 [place] submission pending

## 2022-01-09 ENCOUNTER — Encounter: Payer: Self-pay | Admitting: Physician Assistant

## 2022-01-09 ENCOUNTER — Telehealth: Payer: BC Managed Care – PPO | Admitting: Physician Assistant

## 2022-01-09 DIAGNOSIS — R3989 Other symptoms and signs involving the genitourinary system: Secondary | ICD-10-CM

## 2022-01-09 MED ORDER — FLUCONAZOLE 150 MG PO TABS: 150.0000 mg | ORAL_TABLET | Freq: Once | ORAL | 0 refills | Status: AC

## 2022-01-09 MED ORDER — SULFAMETHOXAZOLE-TRIMETHOPRIM 800-160 MG PO TABS: 1.0000 | ORAL_TABLET | Freq: Two times a day (BID) | ORAL | 0 refills | Status: DC

## 2022-01-09 NOTE — Progress Notes (Signed)
Virtual Visit Consent   Brittany Byrd, you are scheduled for a virtual visit with a [facility] provider today.     Just as with appointments in the office, your consent must be obtained to participate.  Your consent will be active for this visit and any virtual visit you may have with one of our providers in the next 365 days.     If you have a MyChart account, a copy of this consent can be sent to you electronically.  All virtual visits are billed to your insurance company just like a traditional visit in the office.    As this is a virtual visit, video technology does not allow for your provider to perform a traditional examination.  This may limit your provider's ability to fully assess your condition.  If your provider identifies any concerns that need to be evaluated in person or the need to arrange testing (such as labs, EKG, etc.), we will make arrangements to do so.     Although advances in technology are sophisticated, we cannot ensure that it will always work on either your end or our end.  If the connection with a video visit is poor, the visit may have to be switched to a telephone visit.  With either a video or telephone visit, we are not always able to ensure that we have a secure connection.     I need to obtain your verbal consent now.   Are you willing to proceed with your visit today?    Brittany Byrd has provided verbal consent on 01/09/2022 for a virtual visit (video or telephone).   Mar Daring, PA-C   Date: 01/09/2022 2:19 PM   Virtual Visit via Video Note   I, Mar Daring, connected with  Brittany Byrd  ([id number], 20-Aug-1971) on 01/09/22 at  2:15 PM EST by a video-enabled telemedicine application and verified that I am speaking with the correct person using two identifiers.  Location: Patient: Virtual Visit Location Patient: Home Provider: Virtual Visit Location Provider: Home Office   I discussed the limitations of evaluation and management by  telemedicine and the availability of in person appointments. The patient expressed understanding and agreed to proceed.    History of Present Illness: Brittany Byrd is a 51 y.o. who identifies as a female who was assigned female at birth, and is being seen today for possible UTI.  HPI: Urinary Tract Infection  This is a new problem. The current episode started in the past 7 days (3-4 days; suspect symptoms started as viral gastroenteritis with nausea, diarrhea, and some stomach cramping in the [facility] from Sunday- Tuesday. Those symptoms have improved but had burning with urination, frequency and hesitation started this morning). The problem has been gradually worsening. The quality of the pain is described as aching and burning. The pain is mild. There has been no fever. Associated symptoms include chills, frequency, hesitancy, nausea and urgency. Pertinent negatives include no discharge, flank pain, hematuria, sweats or vomiting. Treatments tried: AZO. The treatment provided mild relief. There is no history of recurrent UTIs.     Problems:  Patient Active Problem List   Diagnosis Date Noted   Hyperlipidemia 10/30/2021   Elevated fasting glucose 10/30/2021   Recurrent major depressive disorder ([place]) 12/15/2020   Elevated LDL cholesterol level 10/05/2019   Left flank pain 10/05/2019   Left lower quadrant guarding 10/05/2019   Ketonuria 10/04/2019   Isolated proteinuria with morphologic lesion 10/04/2019   Bilirubinuria 10/04/2019  Uterine fibroid 01/05/2019   Ovarian cyst 01/05/2019   BMI 35.0-35.9,adult 04/20/2017   Heart murmur 04/20/2017   Constipation 04/18/2017   PMDD (premenstrual dysphoric disorder) 01/21/2017   Neuropathy 08/28/2016   Tinnitus of right ear 08/28/2016   Class 2 obesity due to excess calories without serious comorbidity with body mass index (BMI) of 39.0 to 39.9 in adult 03/11/2015   Epicondylitis elbow, medial 11/04/2014   Patellofemoral syndrome 11/04/2014    Asthma, allergic 11/18/2012   Hypertension    Seasonal allergies    Migraine     Allergies:  Allergies  Allergen Reactions   Prednisone Other (See Comments)    "can't sleep, it will keep me up all night but I can tolerate the shot form"   Singulair [Montelukast Sodium]     depressed   Medications:  Current Outpatient Medications:    fluconazole (DIFLUCAN) 150 MG tablet, Take 1 tablet (150 mg total) by mouth once for 1 dose., Disp: 1 tablet, Rfl: 0   sulfamethoxazole-trimethoprim (BACTRIM DS) 800-160 MG tablet, Take 1 tablet by mouth 2 (two) times daily., Disp: 10 tablet, Rfl: 0   ibuprofen (ADVIL,MOTRIN) 800 MG tablet, Take 1 tablet (800 mg total) by mouth every 8 (eight) hours as needed., Disp: 60 tablet, Rfl: 2   Liraglutide -Weight Management (SAXENDA) 18 MG/3ML SOPN, Inject 3 mg into the skin daily. 0.6 mg inj subcut daily for 1 week, then incr by 0.6 mg weekly until reaching 3 mg injected subcut daily, Disp: 15 mL, Rfl: 1   losartan (COZAAR) 25 MG tablet, Take 1 tablet (25 mg total) by mouth daily., Disp: 90 tablet, Rfl: 1   sertraline (ZOLOFT) 50 MG tablet, TAKE 1 TABLET (50 MG TOTAL) BY MOUTH DAILY. APPT FOR FURTHER REFILLS, Disp: 15 tablet, Rfl: 0  Observations/Objective: Patient is well-developed, well-nourished in no acute distress.  Resting comfortably at home.  Head is normocephalic, atraumatic.  No labored breathing.  Speech is clear and coherent with logical content.  Patient is alert and oriented at baseline.    Assessment and Plan: 1. Suspected UTI - sulfamethoxazole-trimethoprim (BACTRIM DS) 800-160 MG tablet; Take 1 tablet by mouth 2 (two) times daily.  Dispense: 10 tablet; Refill: 0 - fluconazole (DIFLUCAN) 150 MG tablet; Take 1 tablet (150 mg total) by mouth once for 1 dose.  Dispense: 1 tablet; Refill: 0  - Worsening symptoms.  - Will treat empirically with Bactrim  - May continue AZO - Gets yeast infections with antibiotics. Diflucan given  - Continue to  push fluids.  - She is to seek in person evaluation if symptoms do not improve or if they worsen.    Follow Up Instructions: I discussed the assessment and treatment plan with the patient. The patient was provided an opportunity to ask questions and all were answered. The patient agreed with the plan and demonstrated an understanding of the instructions.  A copy of instructions were sent to the patient via MyChart unless otherwise noted below.    The patient was advised to call back or seek an in-person evaluation if the symptoms worsen or if the condition fails to improve as anticipated.  Time:  I spent 10 minutes with the patient via telehealth technology discussing the above problems/concerns.    Mar Daring, PA-C

## 2022-01-09 NOTE — Patient Instructions (Signed)
Brittany Byrd, thank you for joining Mar Daring, PA-C for today's virtual visit.  While this provider is not your primary care provider (PCP), if your PCP is located in our provider database this encounter information will be shared with them immediately following your visit.  Consent: (Patient) Brittany Byrd provided verbal consent for this virtual visit at the beginning of the encounter.  Current Medications:  Current Outpatient Medications:    fluconazole (DIFLUCAN) 150 MG tablet, Take 1 tablet (150 mg total) by mouth once for 1 dose., Disp: 1 tablet, Rfl: 0   sulfamethoxazole-trimethoprim (BACTRIM DS) 800-160 MG tablet, Take 1 tablet by mouth 2 (two) times daily., Disp: 10 tablet, Rfl: 0   ibuprofen (ADVIL,MOTRIN) 800 MG tablet, Take 1 tablet (800 mg total) by mouth every 8 (eight) hours as needed., Disp: 60 tablet, Rfl: 2   Liraglutide -Weight Management (SAXENDA) 18 MG/3ML SOPN, Inject 3 mg into the skin daily. 0.6 mg inj subcut daily for 1 week, then incr by 0.6 mg weekly until reaching 3 mg injected subcut daily, Disp: 15 mL, Rfl: 1   losartan (COZAAR) 25 MG tablet, Take 1 tablet (25 mg total) by mouth daily., Disp: 90 tablet, Rfl: 1   sertraline (ZOLOFT) 50 MG tablet, TAKE 1 TABLET (50 MG TOTAL) BY MOUTH DAILY. APPT FOR FURTHER REFILLS, Disp: 15 tablet, Rfl: 0   Medications ordered in this encounter:  Meds ordered this encounter  Medications   sulfamethoxazole-trimethoprim (BACTRIM DS) 800-160 MG tablet    Sig: Take 1 tablet by mouth 2 (two) times daily.    Dispense:  10 tablet    Refill:  0    Order Specific Question:   Supervising Provider    Answer:   Sabra Heck, BRIAN [[id number]]   fluconazole (DIFLUCAN) 150 MG tablet    Sig: Take 1 tablet (150 mg total) by mouth once for 1 dose.    Dispense:  1 tablet    Refill:  0    Order Specific Question:   Supervising Provider    Answer:   Sabra Heck, BRIAN [[id number]]     *If you need refills on other medications prior to your next  appointment, please contact your pharmacy*  Follow-Up: Call back or seek an in-person evaluation if the symptoms worsen or if the condition fails to improve as anticipated.  Other Instructions Urinary Tract Infection, Adult A urinary tract infection (UTI) is an infection of any part of the urinary tract. The urinary tract includes: The kidneys. The ureters. The bladder. The urethra. These organs make, store, and get rid of pee (urine) in the body. What are the causes? This infection is caused by germs (bacteria) in your genital area. These germs grow and cause swelling (inflammation) of your urinary tract. What increases the risk? The following factors may make you more likely to develop this condition: Using a small, thin tube (catheter) to drain pee. Not being able to control when you pee or poop (incontinence). Being female. If you are female, these things can increase the risk: Using these methods to prevent pregnancy: A medicine that kills sperm (spermicide). A device that blocks sperm (diaphragm). Having low levels of a female hormone (estrogen). Being pregnant. You are more likely to develop this condition if: You have genes that add to your risk. You are sexually active. You take antibiotic medicines. You have trouble peeing because of: A prostate that is bigger than normal, if you are female. A blockage in the part of your body that drains  pee from the bladder. A kidney stone. A nerve condition that affects your bladder. Not getting enough to drink. Not peeing often enough. You have other conditions, such as: Diabetes. A weak disease-fighting system (immune system). Sickle cell disease. Gout. Injury of the spine. What are the signs or symptoms? Symptoms of this condition include: Needing to pee right away. Peeing small amounts often. Pain or burning when peeing. Blood in the pee. Pee that smells bad or not like normal. Trouble peeing. Pee that is  cloudy. Fluid coming from the vagina, if you are female. Pain in the belly or lower back. Other symptoms include: Vomiting. Not feeling hungry. Feeling mixed up (confused). This may be the first symptom in older adults. Being tired and grouchy (irritable). A fever. Watery poop (diarrhea). How is this treated? Taking antibiotic medicine. Taking other medicines. Drinking enough water. In some cases, you may need to see a specialist. Follow these instructions at home: Medicines Take over-the-counter and prescription medicines only as told by your doctor. If you were prescribed an antibiotic medicine, take it as told by your doctor. Do not stop taking it even if you start to feel better. General instructions Make sure you: Pee until your bladder is empty. Do not hold pee for a long time. Empty your bladder after sex. Wipe from front to back after peeing or pooping if you are a female. Use each tissue one time when you wipe. Drink enough fluid to keep your pee pale yellow. Keep all follow-up visits. Contact a doctor if: You do not get better after 1-2 days. Your symptoms go away and then come back. Get help right away if: You have very bad back pain. You have very bad pain in your lower belly. You have a fever. You have chills. You feeling like you will vomit or you vomit. Summary A urinary tract infection (UTI) is an infection of any part of the urinary tract. This condition is caused by germs in your genital area. There are many risk factors for a UTI. Treatment includes antibiotic medicines. Drink enough fluid to keep your pee pale yellow. This information is not intended to replace advice given to you by your health care provider. Make sure you discuss any questions you have with your health care provider. Document Revised: 06/30/2020 Document Reviewed: 06/30/2020 Elsevier Patient Education  2022 [employer].    If you have been instructed to have an in-person  evaluation today at a local Urgent Care facility, please use the link below. It will take you to a list of all of our available [facility], including address, phone number and hours of operation. Please do not delay care.  [facility]  If you or a family member do not have a primary care provider, use the link below to schedule a visit and establish care. When you choose a [facility] primary care physician or advanced practice provider, you gain a long-term partner in health. Find a Primary Care Provider  Learn more about [facility]'s in-office and virtual care options: [facility]

## 2022-03-07 ENCOUNTER — Telehealth: Payer: Self-pay

## 2022-03-07 NOTE — Telephone Encounter (Addendum)
Initiated Prior authorization FBP:[id number] '18MG'$ /3ML pen-injectors ?Via: Covermymeds ?Case/Key:[id number] ?Status: approved as of 03/07/22 ?Reason: Effective from 03/07/2022 through 07/10/2022. ?Notified Pt via: Mychart ?

## 2022-03-11 ENCOUNTER — Other Ambulatory Visit: Payer: Self-pay | Admitting: Physician Assistant

## 2022-03-11 DIAGNOSIS — Z1231 Encounter for screening mammogram for malignant neoplasm of breast: Secondary | ICD-10-CM

## 2022-03-26 DIAGNOSIS — Z713 Dietary counseling and surveillance: Secondary | ICD-10-CM | POA: Diagnosis not present

## 2022-04-15 ENCOUNTER — Encounter: Payer: Self-pay | Admitting: Physician Assistant

## 2022-04-18 ENCOUNTER — Ambulatory Visit (INDEPENDENT_AMBULATORY_CARE_PROVIDER_SITE_OTHER): Payer: BC Managed Care – PPO

## 2022-04-18 DIAGNOSIS — Z1231 Encounter for screening mammogram for malignant neoplasm of breast: Secondary | ICD-10-CM

## 2022-04-21 ENCOUNTER — Other Ambulatory Visit: Payer: Self-pay | Admitting: Physician Assistant

## 2022-04-21 DIAGNOSIS — I10 Essential (primary) hypertension: Secondary | ICD-10-CM

## 2022-04-22 NOTE — Progress Notes (Signed)
Normal mammogram. Follow up in 1 year.

## 2022-05-28 ENCOUNTER — Encounter: Payer: Self-pay | Admitting: Physician Assistant

## 2022-05-28 DIAGNOSIS — F339 Major depressive disorder, recurrent, unspecified: Secondary | ICD-10-CM

## 2022-05-28 MED ORDER — ALPRAZOLAM 0.25 MG PO TABS: 0.2500 mg | ORAL_TABLET | Freq: Two times a day (BID) | ORAL | 0 refills | Status: DC | PRN

## 2022-05-28 MED ORDER — SERTRALINE HCL 50 MG PO TABS: 50.0000 mg | ORAL_TABLET | Freq: Every day | ORAL | 0 refills | Status: DC

## 2022-06-01 DIAGNOSIS — R7309 Other abnormal glucose: Secondary | ICD-10-CM | POA: Diagnosis not present

## 2022-06-15 DIAGNOSIS — N10 Acute tubulo-interstitial nephritis: Secondary | ICD-10-CM | POA: Diagnosis not present

## 2022-06-17 ENCOUNTER — Ambulatory Visit (INDEPENDENT_AMBULATORY_CARE_PROVIDER_SITE_OTHER): Payer: BC Managed Care – PPO | Admitting: Physician Assistant

## 2022-06-17 ENCOUNTER — Encounter: Payer: Self-pay | Admitting: Physician Assistant

## 2022-06-17 VITALS — BP 143/90 | HR 88 | Temp 98.0°F | Ht 60.0 in | Wt 205.0 lb

## 2022-06-17 DIAGNOSIS — N3001 Acute cystitis with hematuria: Secondary | ICD-10-CM

## 2022-06-17 DIAGNOSIS — R1030 Lower abdominal pain, unspecified: Secondary | ICD-10-CM

## 2022-06-17 DIAGNOSIS — R3989 Other symptoms and signs involving the genitourinary system: Secondary | ICD-10-CM | POA: Diagnosis not present

## 2022-06-17 LAB — POCT URINALYSIS DIP (CLINITEK)
Bilirubin, UA: NEGATIVE
Glucose, UA: 500 mg/dL — AB
Nitrite, UA: POSITIVE — AB
POC PROTEIN,UA: 100 — AB
Spec Grav, UA: 1.015 (ref 1.010–1.025)
Urobilinogen, UA: 1 E.U./dL
pH, UA: 6 (ref 5.0–8.0)

## 2022-06-17 MED ORDER — NITROFURANTOIN MONOHYD MACRO 100 MG PO CAPS: 100.0000 mg | ORAL_CAPSULE | Freq: Two times a day (BID) | ORAL | 0 refills | Status: DC

## 2022-06-17 MED ORDER — FLUCONAZOLE 150 MG PO TABS: 150.0000 mg | ORAL_TABLET | Freq: Once | ORAL | 0 refills | Status: AC

## 2022-06-17 MED ORDER — PHENAZOPYRIDINE HCL 200 MG PO TABS: 200.0000 mg | ORAL_TABLET | Freq: Three times a day (TID) | ORAL | 0 refills | Status: DC

## 2022-06-17 NOTE — Progress Notes (Signed)
Acute Office Visit  Subjective:     Patient ID: Brittany Byrd, female    DOB: [birth year], 51 y.o.   MRN: [id number]  Chief Complaint  Patient presents with   Dysuria    HPI Patient is in today for 3 days of lower abdominal pain and dysuria. She has hx of UTI but has been 5 months since the last one. No fever, chills, body aches. She does report some lower right back pain. She is taking AZO OTC with little relief. She denies any diarrhea. She did have intercourse Friday day and did not clean up or urinate after.   .. Active Ambulatory Problems    Diagnosis Date Noted   Hypertension    Seasonal allergies    Migraine    Asthma, allergic 11/18/2012   Epicondylitis elbow, medial 11/04/2014   Patellofemoral syndrome 11/04/2014   Class 2 obesity due to excess calories without serious comorbidity with body mass index (BMI) of 39.0 to 39.9 in adult 03/11/2015   Neuropathy 08/28/2016   Tinnitus of right ear 08/28/2016   PMDD (premenstrual dysphoric disorder) 01/21/2017   Constipation 04/18/2017   BMI 35.0-35.9,adult 04/20/2017   Heart murmur 04/20/2017   Uterine fibroid 01/05/2019   Ovarian cyst 01/05/2019   Ketonuria 10/04/2019   Isolated proteinuria with morphologic lesion 10/04/2019   Bilirubinuria 10/04/2019   Elevated LDL cholesterol level 10/05/2019   Left flank pain 10/05/2019   Left lower quadrant guarding 10/05/2019   Recurrent major depressive disorder ([place]) 12/15/2020   Hyperlipidemia 10/30/2021   Elevated fasting glucose 10/30/2021   Resolved Ambulatory Problems    Diagnosis Date Noted   No Resolved Ambulatory Problems   Past Medical History:  Diagnosis Date   Uterine cancer ([place])       ROS  See HPI.     Objective:    BP (!) 143/90   Pulse 88   Temp 98 F (36.7 C) (Oral)   Ht 5' (1.524 m)   Wt 205 lb (93 kg)   SpO2 96%   BMI 40.04 kg/m  BP Readings from Last 3 Encounters:  06/17/22 (!) 143/90  10/29/21 (!) 163/82  12/15/20 (!) 168/78   Wt  Readings from Last 3 Encounters:  06/17/22 205 lb (93 kg)  10/29/21 200 lb (90.7 kg)  12/15/20 203 lb (92.1 kg)      Physical Exam Constitutional:      Appearance: Normal appearance. She is obese.  HENT:     Head: Normocephalic.  Cardiovascular:     Rate and Rhythm: Normal rate.     Pulses: Normal pulses.  Pulmonary:     Effort: Pulmonary effort is normal.  Abdominal:     General: There is no distension.     Palpations: There is no mass.     Tenderness: There is abdominal tenderness. There is no right CVA tenderness, left CVA tenderness, guarding or rebound.     Comments: Tenderness over suprapubic area to palpation.   Neurological:     General: No focal deficit present.     Mental Status: She is alert and oriented to person, place, and time.  Psychiatric:        Mood and Affect: Mood normal.     Results for orders placed or performed in visit on 06/17/22  POCT URINALYSIS DIP (CLINITEK)  Result Value Ref Range   Color, UA orange (A) yellow   Clarity, UA cloudy (A) clear   Glucose, UA =500 (A) negative mg/dL   Bilirubin, UA negative  negative   Ketones, POC UA trace (5) (A) negative mg/dL   Spec Grav, UA 1.015 1.010 - 1.025   Blood, UA large (A) negative   pH, UA 6.0 5.0 - 8.0   POC PROTEIN,UA =100 (A) negative, trace   Urobilinogen, UA 1.0 0.2 or 1.0 E.U./dL   Nitrite, UA Positive (A) Negative   Leukocytes, UA Trace (A) Negative        Assessment & Plan:  [name] KitchenMarland KitchenDeanna was seen today for dysuria.  Diagnoses and all orders for this visit:  Acute cystitis with hematuria -     POCT URINALYSIS DIP (CLINITEK) -     Urine Culture -     Urine Culture -     Urine Microscopic -     nitrofurantoin, macrocrystal-monohydrate, (MACROBID) 100 MG capsule; Take 1 capsule (100 mg total) by mouth 2 (two) times daily. -     phenazopyridine (PYRIDIUM) 200 MG tablet; Take 1 tablet (200 mg total) by mouth 3 (three) times daily with meals.  Lower abdominal pain -     POCT URINALYSIS  DIP (CLINITEK) -     Urine Culture -     Urine Culture -     Urine Microscopic  Other orders -     fluconazole (DIFLUCAN) 150 MG tablet; Take 1 tablet (150 mg total) by mouth once for 1 dose. Repeat in 48-72 hours if symptoms persist.   UA positive for blood, leuks, nitrites Treated empirically for UTI with macrobid for 5 days Will culture Pyridium given for 2 days Diflucan for hx of post abx yeast infection HO given for symptomatic relief Drink plenty of water Cup given to recollect for clearance after finish abx.    Iran Planas, PA-C

## 2022-06-17 NOTE — Patient Instructions (Signed)
Urinary Tract Infection, Adult  A urinary tract infection (UTI) is an infection of any part of the urinary tract. The urinary tract includes the kidneys, ureters, bladder, and urethra. These organs make, store, and get rid of urine in the body. An upper UTI affects the ureters and kidneys. A lower UTI affects the bladder and urethra. What are the causes? Most urinary tract infections are caused by bacteria in your genital area around your urethra, where urine leaves your body. These bacteria grow and cause inflammation of your urinary tract. What increases the risk? You are more likely to develop this condition if: You have a urinary catheter that stays in place. You are not able to control when you urinate or have a bowel movement (incontinence). You are female and you: Use a spermicide or diaphragm for birth control. Have low estrogen levels. Are pregnant. You have certain genes that increase your risk. You are sexually active. You take antibiotic medicines. You have a condition that causes your flow of urine to slow down, such as: An enlarged prostate, if you are female. Blockage in your urethra. A kidney stone. A nerve condition that affects your bladder control (neurogenic bladder). Not getting enough to drink, or not urinating often. You have certain medical conditions, such as: Diabetes. A weak disease-fighting system (immunesystem). Sickle cell disease. Gout. Spinal cord injury. What are the signs or symptoms? Symptoms of this condition include: Needing to urinate right away (urgency). Frequent urination. This may include small amounts of urine each time you urinate. Pain or burning with urination. Blood in the urine. Urine that smells bad or unusual. Trouble urinating. Cloudy urine. Vaginal discharge, if you are female. Pain in the abdomen or the lower back. You may also have: Vomiting or a decreased appetite. Confusion. Irritability or tiredness. A fever or  chills. Diarrhea. The first symptom in older adults may be confusion. In some cases, they may not have any symptoms until the infection has worsened. How is this diagnosed? This condition is diagnosed based on your medical history and a physical exam. You may also have other tests, including: Urine tests. Blood tests. Tests for STIs (sexually transmitted infections). If you have had more than one UTI, a cystoscopy or imaging studies may be done to determine the cause of the infections. How is this treated? Treatment for this condition includes: Antibiotic medicine. Over-the-counter medicines to treat discomfort. Drinking enough water to stay hydrated. If you have frequent infections or have other conditions such as a kidney stone, you may need to see a health care provider who specializes in the urinary tract (urologist). In rare cases, urinary tract infections can cause sepsis. Sepsis is a life-threatening condition that occurs when the body responds to an infection. Sepsis is treated in the hospital with IV antibiotics, fluids, and other medicines. Follow these instructions at home:  Medicines Take over-the-counter and prescription medicines only as told by your health care provider. If you were prescribed an antibiotic medicine, take it as told by your health care provider. Do not stop using the antibiotic even if you start to feel better. General instructions Make sure you: Empty your bladder often and completely. Do not hold urine for long periods of time. Empty your bladder after sex. Wipe from front to back after urinating or having a bowel movement if you are female. Use each tissue only one time when you wipe. Drink enough fluid to keep your urine pale yellow. Keep all follow-up visits. This is important. Contact a health   care provider if: Your symptoms do not get better after 1-2 days. Your symptoms go away and then return. Get help right away if: You have severe pain in  your back or your lower abdomen. You have a fever or chills. You have nausea or vomiting. Summary A urinary tract infection (UTI) is an infection of any part of the urinary tract, which includes the kidneys, ureters, bladder, and urethra. Most urinary tract infections are caused by bacteria in your genital area. Treatment for this condition often includes antibiotic medicines. If you were prescribed an antibiotic medicine, take it as told by your health care provider. Do not stop using the antibiotic even if you start to feel better. Keep all follow-up visits. This is important. This information is not intended to replace advice given to you by your health care provider. Make sure you discuss any questions you have with your health care provider. Document Revised: 06/30/2020 Document Reviewed: 06/30/2020 Elsevier Patient Education  2023 [employer] Inc[employer]  

## 2022-06-21 ENCOUNTER — Encounter: Payer: Self-pay | Admitting: Physician Assistant

## 2022-06-21 ENCOUNTER — Other Ambulatory Visit: Payer: Self-pay | Admitting: Physician Assistant

## 2022-06-21 DIAGNOSIS — N3001 Acute cystitis with hematuria: Secondary | ICD-10-CM

## 2022-06-21 LAB — URINALYSIS, MICROSCOPIC ONLY
Hyaline Cast: NONE SEEN /LPF
Squamous Epithelial / HPF: NONE SEEN /HPF (ref <=5)
WBC, UA: >=60 /HPF — AB (ref 0–5)
Yeast: NONE SEEN /HPF

## 2022-06-21 LAB — URINE CULTURE
MICRO NUMBER:: 13661280
SPECIMEN QUALITY:: ADEQUATE

## 2022-06-21 MED ORDER — PHENAZOPYRIDINE HCL 200 MG PO TABS: 200.0000 mg | ORAL_TABLET | Freq: Three times a day (TID) | ORAL | 0 refills | Status: DC

## 2022-06-21 MED ORDER — SULFAMETHOXAZOLE-TRIMETHOPRIM 800-160 MG PO TABS: 1.0000 | ORAL_TABLET | Freq: Two times a day (BID) | ORAL | 0 refills | Status: DC

## 2022-06-21 NOTE — Progress Notes (Signed)
Macrobid will not work as bacteria is resistant to this. Sent bactrim for 7 days to replace.

## 2022-07-02 DIAGNOSIS — R7309 Other abnormal glucose: Secondary | ICD-10-CM | POA: Diagnosis not present

## 2022-07-05 ENCOUNTER — Other Ambulatory Visit: Payer: Self-pay

## 2022-07-05 ENCOUNTER — Encounter: Payer: Self-pay | Admitting: Physician Assistant

## 2022-07-05 DIAGNOSIS — N3001 Acute cystitis with hematuria: Secondary | ICD-10-CM | POA: Diagnosis not present

## 2022-07-05 NOTE — Progress Notes (Signed)
Ordered urine culture per office notes.

## 2022-07-07 LAB — URINE CULTURE
MICRO NUMBER:: 13741712
SPECIMEN QUALITY:: ADEQUATE

## 2022-07-08 NOTE — Progress Notes (Signed)
Culture did not show any significant bacteria accumulation.

## 2022-07-17 ENCOUNTER — Other Ambulatory Visit: Payer: Self-pay | Admitting: Physician Assistant

## 2022-07-17 DIAGNOSIS — I10 Essential (primary) hypertension: Secondary | ICD-10-CM

## 2022-07-18 ENCOUNTER — Telehealth: Payer: Self-pay

## 2022-07-18 NOTE — Telephone Encounter (Addendum)
Initiated Prior authorization UOH:[id number] '18MG'$ /3ML pen-injectors Via: Covermymeds Case/Key: [id number] Status: approved  as of 07/18/22 Reason:Effective from 07/18/2022 through 11/20/2022. Notified Pt via: Mychart

## 2022-07-25 ENCOUNTER — Other Ambulatory Visit: Payer: Self-pay | Admitting: Physician Assistant

## 2022-07-25 DIAGNOSIS — F339 Major depressive disorder, recurrent, unspecified: Secondary | ICD-10-CM

## 2022-08-02 DIAGNOSIS — R7309 Other abnormal glucose: Secondary | ICD-10-CM | POA: Diagnosis not present

## 2022-08-21 ENCOUNTER — Other Ambulatory Visit: Payer: Self-pay | Admitting: Physician Assistant

## 2022-08-21 DIAGNOSIS — I10 Essential (primary) hypertension: Secondary | ICD-10-CM

## 2022-09-01 ENCOUNTER — Telehealth: Payer: BC Managed Care – PPO | Admitting: Family

## 2022-09-01 ENCOUNTER — Other Ambulatory Visit: Payer: Self-pay | Admitting: Physician Assistant

## 2022-09-01 DIAGNOSIS — J208 Acute bronchitis due to other specified organisms: Secondary | ICD-10-CM | POA: Diagnosis not present

## 2022-09-01 DIAGNOSIS — R7309 Other abnormal glucose: Secondary | ICD-10-CM | POA: Diagnosis not present

## 2022-09-01 DIAGNOSIS — B9689 Other specified bacterial agents as the cause of diseases classified elsewhere: Secondary | ICD-10-CM | POA: Diagnosis not present

## 2022-09-01 DIAGNOSIS — I10 Essential (primary) hypertension: Secondary | ICD-10-CM

## 2022-09-01 MED ORDER — BENZONATATE 100 MG PO CAPS: 100.0000 mg | ORAL_CAPSULE | Freq: Three times a day (TID) | ORAL | 0 refills | Status: DC | PRN

## 2022-09-01 MED ORDER — AZITHROMYCIN 250 MG PO TABS: ORAL_TABLET | ORAL | 0 refills | Status: DC

## 2022-09-01 NOTE — Progress Notes (Signed)
We are sorry that you are not feeling well.  Here is how we plan to help!  Based on your presentation I believe you most likely have A cough due to bacteria.  When patients have a fever and a productive cough with a change in color or increased sputum production, we are concerned about bacterial bronchitis.  If left untreated it can progress to pneumonia.  If your symptoms do not improve with your treatment plan it is important that you contact your provider.   I have prescribed Azithromyin 250 mg: two tablets now and then one tablet daily for 4 additonal days    In addition you may use A non-prescription cough medication called Robitussin DAC. Take 2 teaspoons every 8 hours or Delsym: take 2 teaspoons every 12 hours., A non-prescription cough medication called Mucinex DM: take 2 tablets every 12 hours., and A prescription cough medication called Tessalon Perles 100mg. You may take 1-2 capsules every 8 hours as needed for your cough.    From your responses in the eVisit questionnaire you describe inflammation in the upper respiratory tract which is causing a significant cough.  This is commonly called Bronchitis and has four common causes:   Allergies Viral Infections Acid Reflux Bacterial Infection Allergies, viruses and acid reflux are treated by controlling symptoms or eliminating the cause. An example might be a cough caused by taking certain blood pressure medications. You stop the cough by changing the medication. Another example might be a cough caused by acid reflux. Controlling the reflux helps control the cough.  USE OF BRONCHODILATOR ("RESCUE") INHALERS: There is a risk from using your bronchodilator too frequently.  The risk is that over-reliance on a medication which only relaxes the muscles surrounding the breathing tubes can reduce the effectiveness of medications prescribed to reduce swelling and congestion of the tubes themselves.  Although you feel brief relief from the  bronchodilator inhaler, your asthma may actually be worsening with the tubes becoming more swollen and filled with mucus.  This can delay other crucial treatments, such as oral steroid medications. If you need to use a bronchodilator inhaler daily, several times per day, you should discuss this with your provider.  There are probably better treatments that could be used to keep your asthma under control.     HOME CARE Only take medications as instructed by your medical team. Complete the entire course of an antibiotic. Drink plenty of fluids and get plenty of rest. Avoid close contacts especially the very young and the elderly Cover your mouth if you cough or cough into your sleeve. Always remember to wash your hands A steam or ultrasonic humidifier can help congestion.   GET HELP RIGHT AWAY IF: You develop worsening fever. You become short of breath You cough up blood. Your symptoms persist after you have completed your treatment plan MAKE SURE YOU  Understand these instructions. Will watch your condition. Will get help right away if you are not doing well or get worse.    Thank you for choosing an e-visit.  Your e-visit answers were reviewed by a board certified advanced clinical practitioner to complete your personal care plan. Depending upon the condition, your plan could have included both over the counter or prescription medications.  Please review your pharmacy choice. Make sure the pharmacy is open so you can pick up prescription now. If there is a problem, you may contact your provider through MyChart messaging and have the prescription routed to another pharmacy.  Your safety is   important to us. If you have drug allergies check your prescription carefully.   For the next 24 hours you can use MyChart to ask questions about today's visit, request a non-urgent call back, or ask for a work or school excuse. You will get an email in the next two days asking about your experience. I  hope that your e-visit has been valuable and will speed your recovery.   Approximately 5 minutes was spent documenting and reviewing patient's chart.   

## 2022-09-02 NOTE — Telephone Encounter (Signed)
Called patient and LVM to schedule an appointment. Brittany Byrd

## 2022-09-02 NOTE — Telephone Encounter (Signed)
Losartan sent for one week supply, please call patient to schedule follow up appt.

## 2022-10-02 DIAGNOSIS — R7309 Other abnormal glucose: Secondary | ICD-10-CM | POA: Diagnosis not present

## 2022-10-22 ENCOUNTER — Other Ambulatory Visit: Payer: Self-pay | Admitting: Physician Assistant

## 2022-10-22 DIAGNOSIS — F339 Major depressive disorder, recurrent, unspecified: Secondary | ICD-10-CM

## 2022-11-01 DIAGNOSIS — R7309 Other abnormal glucose: Secondary | ICD-10-CM | POA: Diagnosis not present

## 2022-11-21 ENCOUNTER — Other Ambulatory Visit: Payer: Self-pay | Admitting: Physician Assistant

## 2022-11-21 DIAGNOSIS — F339 Major depressive disorder, recurrent, unspecified: Secondary | ICD-10-CM

## 2022-12-02 DIAGNOSIS — R7309 Other abnormal glucose: Secondary | ICD-10-CM | POA: Diagnosis not present

## 2022-12-13 ENCOUNTER — Encounter: Payer: Self-pay | Admitting: Physician Assistant

## 2022-12-13 ENCOUNTER — Ambulatory Visit (INDEPENDENT_AMBULATORY_CARE_PROVIDER_SITE_OTHER): Payer: BC Managed Care – PPO | Admitting: Physician Assistant

## 2022-12-13 VITALS — BP 163/89 | HR 106 | Ht 60.0 in | Wt 206.0 lb

## 2022-12-13 DIAGNOSIS — F339 Major depressive disorder, recurrent, unspecified: Secondary | ICD-10-CM

## 2022-12-13 DIAGNOSIS — E7800 Pure hypercholesterolemia, unspecified: Secondary | ICD-10-CM

## 2022-12-13 DIAGNOSIS — I10 Essential (primary) hypertension: Secondary | ICD-10-CM

## 2022-12-13 DIAGNOSIS — F411 Generalized anxiety disorder: Secondary | ICD-10-CM

## 2022-12-13 DIAGNOSIS — F410 Panic disorder [episodic paroxysmal anxiety] without agoraphobia: Secondary | ICD-10-CM | POA: Diagnosis not present

## 2022-12-13 DIAGNOSIS — Z1329 Encounter for screening for other suspected endocrine disorder: Secondary | ICD-10-CM

## 2022-12-13 DIAGNOSIS — Z79899 Other long term (current) drug therapy: Secondary | ICD-10-CM

## 2022-12-13 MED ORDER — SERTRALINE HCL 50 MG PO TABS: 50.0000 mg | ORAL_TABLET | Freq: Every day | ORAL | 0 refills | Status: DC

## 2022-12-13 MED ORDER — ALPRAZOLAM 0.25 MG PO TABS: 0.2500 mg | ORAL_TABLET | Freq: Two times a day (BID) | ORAL | 0 refills | Status: DC | PRN

## 2022-12-13 MED ORDER — LOSARTAN POTASSIUM 50 MG PO TABS: 50.0000 mg | ORAL_TABLET | Freq: Every day | ORAL | 0 refills | Status: DC

## 2022-12-13 NOTE — Progress Notes (Unsigned)
Acute Office Visit  Subjective:     Patient ID: Brittany Byrd, female    DOB: 24-Jul-1971, 52 y.o.   MRN: [id number]  Chief Complaint  Patient presents with   Follow-up    HPI Patient is in today for her mood. She stopped zoloft back in November because she thought she was doing so well. Her work has become much more stressful and she realized she needed it. She started back a few days ago and already feels some better. Over the weekend she had multiple panic attacks and needed her xanax. She has used 5 xanaxs this week. When 28 xanax has been lasting her all year. She is anxious and depressed. She is having trouble concentrating and making decisions. No SI/HC. She is interested in talking with someone as well.   No CP, palpitations, headaches, or vision changes. Not taking BP medication either.   .. Active Ambulatory Problems    Diagnosis Date Noted   Hypertension    Seasonal allergies    Migraine    Asthma, allergic 11/18/2012   Epicondylitis elbow, medial 11/04/2014   Patellofemoral syndrome 11/04/2014   Class 2 obesity due to excess calories without serious comorbidity with body mass index (BMI) of 39.0 to 39.9 in adult 03/11/2015   Neuropathy 08/28/2016   Tinnitus of right ear 08/28/2016   PMDD (premenstrual dysphoric disorder) 01/21/2017   Constipation 04/18/2017   BMI 35.0-35.9,adult 04/20/2017   Heart murmur 04/20/2017   Uterine fibroid 01/05/2019   Ovarian cyst 01/05/2019   Ketonuria 10/04/2019   Isolated proteinuria with morphologic lesion 10/04/2019   Bilirubinuria 10/04/2019   Elevated LDL cholesterol level 10/05/2019   Left flank pain 10/05/2019   Left lower quadrant guarding 10/05/2019   Recurrent major depressive disorder ([place]) 12/15/2020   Hyperlipidemia 10/30/2021   Elevated fasting glucose 10/30/2021   Panic attacks 12/13/2022   GAD (generalized anxiety disorder) 12/13/2022   Resolved Ambulatory Problems    Diagnosis Date Noted   No Resolved  Ambulatory Problems   Past Medical History:  Diagnosis Date   Uterine cancer ([place])      ROS  See HPI.     Objective:    BP (!) 163/89   Pulse (!) 106   Ht 5' (1.524 m)   Wt 206 lb (93.4 kg)   SpO2 97%   BMI 40.23 kg/m  BP Readings from Last 3 Encounters:  12/13/22 (!) 163/89  06/17/22 (!) 143/90  10/29/21 (!) 163/82   Wt Readings from Last 3 Encounters:  12/13/22 206 lb (93.4 kg)  06/17/22 205 lb (93 kg)  10/29/21 200 lb (90.7 kg)    ..    12/13/2022    3:20 PM 12/15/2020   11:04 AM 10/04/2019   10:03 AM 02/25/2018    9:39 AM 04/18/2017    9:48 AM  Depression screen PHQ 2/9  Decreased Interest '3 2 1 2 '$ 0  Down, Depressed, Hopeless '3 2 1 2 '$ 0  PHQ - 2 Score '6 4 2 4 '$ 0  Altered sleeping '3 1 1 2   '$ Tired, decreased energy '3 3 1 2   '$ Change in appetite '3 3 1 2   '$ Feeling bad or failure about yourself  '3 3 1 2   '$ Trouble concentrating '3 2 1 1   '$ Moving slowly or fidgety/restless 1 1 0 1   Suicidal thoughts 0 0 0 2   PHQ-9 Score '22 17 7 16   '$ Difficult doing work/chores Very difficult Somewhat difficult Not difficult at  all Very difficult    ..    12/13/2022    3:21 PM 12/15/2020   11:05 AM 10/04/2019   10:04 AM  GAD 7 : Generalized Anxiety Score  Nervous, Anxious, on Edge '3 3 1  '$ Control/stop worrying '3 2 1  '$ Worry too much - different things 3  1  Trouble relaxing '3 3 1  '$ Restless 2 2 0  Easily annoyed or irritable '2 3 1  '$ Afraid - awful might happen 3  1  Total GAD 7 Score 19  6  Anxiety Difficulty Very difficult Somewhat difficult Somewhat difficult      Physical Exam Constitutional:      Appearance: Normal appearance. She is obese.  HENT:     Head: Normocephalic.  Cardiovascular:     Rate and Rhythm: Regular rhythm. Tachycardia present.  Pulmonary:     Effort: Pulmonary effort is normal.     Breath sounds: Normal breath sounds.  Neurological:     General: No focal deficit present.     Mental Status: She is alert and oriented to person, place, and time.   Psychiatric:     Comments: anxious          Assessment & Plan:  [name] KitchenMarland KitchenDeanna was seen today for follow-up.  Diagnoses and all orders for this visit:  Panic attacks -     ALPRAZolam (XANAX) 0.25 MG tablet; Take 1 tablet (0.25 mg total) by mouth 2 (two) times daily as needed for anxiety. -     Ambulatory referral to Psychology  Primary hypertension -     COMPLETE METABOLIC PANEL WITH GFR -     losartan (COZAAR) 50 MG tablet; Take 1 tablet (50 mg total) by mouth daily.  Pure hypercholesterolemia -     Lipid Panel w/reflex Direct LDL  Thyroid disorder screen -     TSH  Medication management -     TSH -     Lipid Panel w/reflex Direct LDL -     COMPLETE METABOLIC PANEL WITH GFR -     CBC with Differential/Platelet  Recurrent major depressive disorder, remission status unspecified (HCC) -     sertraline (ZOLOFT) 50 MG tablet; Take 1 tablet (50 mg total) by mouth daily. -     Ambulatory referral to Psychology  GAD (generalized anxiety disorder) -     sertraline (ZOLOFT) 50 MG tablet; Take 1 tablet (50 mg total) by mouth daily. -     Ambulatory referral to Psychology   Overall likely coming off zoloft is where anxiety/depression and not being able to handle it started to occur.  Strongly recommended medication compliance.  PHQ and GAD numbers elevated.  Restart zoloft, refill sent Referral for counseling Xanax refilled for as needed only use  BP elevated today Not taking medication Sent cozaar  Start checking at home goal is under 140/90.  Follow up 4-8 weeks  Spent 37 minutes with patient in room, reviewing charts/medication and going over treatment plan.    Iran Planas, PA-C

## 2022-12-13 NOTE — Patient Instructions (Signed)
Panic Attack ?A panic attack is a sudden episode of severe anxiety, fear, or discomfort that causes physical and emotional symptoms. A panic attack may be in response to something frightening, or it may occur for no known reason. ?Symptoms of a panic attack can be similar to symptoms of a heart attack or stroke. It is important to see your health care provider when you have a panic attack so that these conditions can be ruled out. ?What are the causes? ?A panic attack may be caused by: ?An extreme, life-threatening situation, such as a war or natural disaster. ?An anxiety disorder, such as post-traumatic stress disorder. ?Depression. ?Panic disorder. ?Certain medical conditions, including heart problems, neurological conditions, and infections. ?Other causes may include: ?Certain over-the-counter and prescription medicines. ?Supplements that increase anxiety. ?Illegal drugs that increase heart rate and blood pressure, such as methamphetamine. ?What increases the risk? ?You are more likely to develop this condition if: ?You have another mental health condition. ?You use alcohol, illegal drugs, or other substances. ?You are under extreme stress. ?A life event is causing increased feelings of anxiety and depression. ?What are the signs or symptoms? ?A panic attack starts suddenly, usually lasts 5-10 minutes, and occurs with one or more of the following: ?A pounding heart, or a feeling that your heart is beating irregularly or faster than normal (palpitations). ?Sweating, trembling, or shaking. ?Shortness of breath, feeling smothered, or feeling choked. ?Chest pain or discomfort. ?Nausea or a strange feeling in your stomach. ?Dizziness, feeling light-headed, or feeling like you might faint. ?Other symptoms may include: ?Chills or hot flashes. ?Numbness or tingling in your lips, hands, or feet. ?Feeling confused, or feeling that you are not yourself. ?Fear of losing control or of being emotionally unstable, or fear of  dying. ?How is this diagnosed? ?A panic attack is diagnosed with an assessment by your health care provider. During the assessment, your health care provider will ask questions about: ?Your history of anxiety, depression, and panic attacks. ?Your medical history. ?Whether you drink alcohol, use drugs, take supplements, or take medicines. Be honest about your substance use. ?Your health care provider may also: ?Order blood tests or other kinds of tests to rule out serious medical conditions. ?Refer you to a mental health professional for further evaluation. ?How is this treated? ?A panic attack is a symptom of another condition. Treatment depends on the cause of the panic attack. ?If the cause is a medical problem, your health care provider will treat that problem or refer you to a specialist. ?If the cause is emotional, you may be given anti-anxiety medicines or referred to a counselor. Anti-anxiety medicines may reduce how often attacks happen, reduce how severe the attacks are, and lower anxiety. ?If the cause is a medicine, your health care provider may tell you to stop the medicine, change your dose, or take a different medicine. ?If the cause is an illegal drug, treatment may involve letting the drug wear off and taking medicine to help the drug leave your body or to stop its effects. Attacks caused by heavy drug use may continue even if you stop using the drug. ?Most panic attacks go away with treatment of the underlying problem. If you have panic attacks often, you may have a condition called panic disorder. ?Follow these instructions at home: ?Alcohol use ?Do not drink alcohol if: ?Your health care provider tells you not to drink. ?You are pregnant, may be pregnant, or are planning to become pregnant. ?If you drink alcohol: ?Limit how much   you have to: ?0-1 drink a day for women. ?0-2 drinks a day for men. ?Know how much alcohol is in your drink. In the [place] one drink equals one 12 oz bottle of beer (355  mL), one 5 oz glass of wine (148 mL), or one 1? oz glass of hard liquor (44 mL). ?General instructions ?Take over-the-counter and prescription medicines only as told by your health care provider. ?If you feel anxious, limit your caffeine intake. ?Take good care of your physical and mental health by: ?Eating a balanced diet that includes plenty of fresh fruits and vegetables, whole grains, lean meats, and low-fat dairy. ?Getting plenty of rest. Try to get 7-8 hours of uninterrupted sleep each night. ?Exercising regularly. Try to get 30 minutes of physical activity at least 5 days a week. ?Do not use any products that contain nicotine or tobacco. These products include cigarettes, chewing tobacco, and vaping devices, such as e-cigarettes. If you need help quitting, ask your health care provider. ?Keep all follow-up visits. This is important. Panic attacks may have underlying physical or emotional problems that take time to accurately diagnose. ?Where to find more information ?Substance Abuse and Mental Health Services Administration (SAMHSA): samhsa.gov ?National Institute of Mental Health (NIMH): www.nimh.nih.gov ?Contact a health care provider if: ?Your symptoms do not improve, or they get worse. ?You are not able to take your medicine as prescribed because of side effects. ?Get help right away if: ?You have thoughts about hurting yourself or others. ?Get help right away if you feel like you may hurt yourself or others, or have thoughts about taking your own life. Go to your nearest emergency room or: ?Call 911. ?Call the National Suicide Prevention Lifeline at 1-800-273-8255 or 988. This is open 24 hours a day. ?Text the Crisis Text Line at 741741. ?Summary ?A panic attack is a sudden episode of severe anxiety, fear, or discomfort that causes physical and emotional symptoms. ?Always see a health care provider to have the reasons for the panic attack correctly diagnosed. ?If your panic attack was caused by a  physical problem, follow your health care provider's suggestions for medicine, referral to a specialist, and lifestyle changes. ?If your panic attack was caused by an emotional problem, follow through with counseling from a qualified mental health specialist. ?If you feel like you may hurt yourself or others, call 911 and get help right away. ?This information is not intended to replace advice given to you by your health care provider. Make sure you discuss any questions you have with your health care provider. ?Document Revised: 06/28/2021 Document Reviewed: 06/28/2021 ?Elsevier Patient Education ? 2023 Elsevier Inc. ? ?

## 2022-12-30 ENCOUNTER — Encounter: Payer: Self-pay | Admitting: Physician Assistant

## 2022-12-30 MED ORDER — OSELTAMIVIR PHOSPHATE 75 MG PO CAPS: 75.0000 mg | ORAL_CAPSULE | Freq: Two times a day (BID) | ORAL | 0 refills | Status: DC

## 2023-01-02 DIAGNOSIS — R7309 Other abnormal glucose: Secondary | ICD-10-CM | POA: Diagnosis not present

## 2023-01-16 ENCOUNTER — Encounter: Payer: Self-pay | Admitting: Physician Assistant

## 2023-01-17 MED ORDER — TRAMADOL HCL 50 MG PO TABS: 50.0000 mg | ORAL_TABLET | Freq: Four times a day (QID) | ORAL | 0 refills | Status: AC | PRN

## 2023-01-31 DIAGNOSIS — R7309 Other abnormal glucose: Secondary | ICD-10-CM | POA: Diagnosis not present

## 2023-02-03 DIAGNOSIS — F4323 Adjustment disorder with mixed anxiety and depressed mood: Secondary | ICD-10-CM | POA: Diagnosis not present

## 2023-02-17 DIAGNOSIS — F4323 Adjustment disorder with mixed anxiety and depressed mood: Secondary | ICD-10-CM | POA: Diagnosis not present

## 2023-02-25 ENCOUNTER — Ambulatory Visit (INDEPENDENT_AMBULATORY_CARE_PROVIDER_SITE_OTHER): Payer: BC Managed Care – PPO

## 2023-02-25 ENCOUNTER — Ambulatory Visit (INDEPENDENT_AMBULATORY_CARE_PROVIDER_SITE_OTHER): Payer: BC Managed Care – PPO | Admitting: Physician Assistant

## 2023-02-25 ENCOUNTER — Encounter: Payer: Self-pay | Admitting: Physician Assistant

## 2023-02-25 ENCOUNTER — Other Ambulatory Visit (INDEPENDENT_AMBULATORY_CARE_PROVIDER_SITE_OTHER): Payer: BC Managed Care – PPO

## 2023-02-25 ENCOUNTER — Ambulatory Visit (INDEPENDENT_AMBULATORY_CARE_PROVIDER_SITE_OTHER): Payer: BC Managed Care – PPO | Admitting: Sports Medicine

## 2023-02-25 VITALS — BP 159/97 | HR 75 | Ht 60.0 in | Wt 208.0 lb

## 2023-02-25 DIAGNOSIS — E66812 Obesity, class 2: Secondary | ICD-10-CM | POA: Insufficient documentation

## 2023-02-25 DIAGNOSIS — M25461 Effusion, right knee: Secondary | ICD-10-CM

## 2023-02-25 DIAGNOSIS — W19XXXA Unspecified fall, initial encounter: Secondary | ICD-10-CM

## 2023-02-25 DIAGNOSIS — M25561 Pain in right knee: Secondary | ICD-10-CM

## 2023-02-25 DIAGNOSIS — K580 Irritable bowel syndrome with diarrhea: Secondary | ICD-10-CM

## 2023-02-25 DIAGNOSIS — M1711 Unilateral primary osteoarthritis, right knee: Secondary | ICD-10-CM

## 2023-02-25 DIAGNOSIS — I10 Essential (primary) hypertension: Secondary | ICD-10-CM

## 2023-02-25 MED ORDER — INSULIN PEN NEEDLE 31G X 6 MM MISC: 0 refills | Status: DC

## 2023-02-25 MED ORDER — DICYCLOMINE HCL 20 MG PO TABS: 20.0000 mg | ORAL_TABLET | Freq: Three times a day (TID) | ORAL | 2 refills | Status: DC

## 2023-02-25 MED ORDER — SAXENDA 18 MG/3ML ~~LOC~~ SOPN: 3.0000 mg | PEN_INJECTOR | Freq: Every day | SUBCUTANEOUS | 11 refills | Status: DC

## 2023-02-25 MED ORDER — LOSARTAN POTASSIUM 100 MG PO TABS: 100.0000 mg | ORAL_TABLET | Freq: Every day | ORAL | 0 refills | Status: DC

## 2023-02-25 MED ORDER — DICLOFENAC SODIUM 75 MG PO TBEC: 75.0000 mg | DELAYED_RELEASE_TABLET | Freq: Two times a day (BID) | ORAL | 2 refills | Status: DC

## 2023-02-25 NOTE — Patient Instructions (Addendum)
See Dr. Darene Lamer.  Start diclofenac twice a day Start saxenda for weight loss Keep monitoring BP at home if abov3e 140/90 increase cozaar to 100mg  daily.

## 2023-02-25 NOTE — Progress Notes (Signed)
    Procedures performed today:    Procedure: Real-time Ultrasound Guided injection of the right knee Device: Samsung HS60  Verbal informed consent obtained.  Time-out conducted.  Noted no overlying erythema, induration, or other signs of local infection.  Skin prepped in a sterile fashion.  Local anesthesia: Topical Ethyl chloride.  With sterile technique and under real time ultrasound guidance: Mild effusion noted, 1 cc Kenalog 40, 2 cc lidocaine, 2 cc bupivacaine injected easily Completed without difficulty  Advised to call if fevers/chills, erythema, induration, drainage, or persistent bleeding.  Images permanently stored and available for review in PACS.  Impression: Technically successful ultrasound guided injection.  Independent interpretation of notes and tests performed by another provider:   None.  Brief History, Exam, Impression, and Recommendations:    Primary osteoarthritis of right knee This is a very pleasant 52 year old female, she has chronic right knee pain, x-ray confirmed osteoarthritis, she tried oral NSAIDs without improvement, due to failure of conservative treatment we will do a knee injection, she also needs aggressive therapy to strengthen her quadriceps, return to see me in 6 weeks.    ____________________________________________ Gwen Her. Dianah Field, M.D., ABFM., CAQSM., AME. Primary Care and Sports Medicine [facility]  Adjunct Professor of [employer] [employer]  Risk manager

## 2023-02-25 NOTE — Progress Notes (Signed)
You had a cancellation and I put her in your 11:15.

## 2023-02-25 NOTE — Assessment & Plan Note (Signed)
This is a very pleasant 52 year old female, she has chronic right knee pain, x-ray confirmed osteoarthritis, she tried oral NSAIDs without improvement, due to failure of conservative treatment we will do a knee injection, she also needs aggressive therapy to strengthen her quadriceps, return to see me in 6 weeks.

## 2023-02-25 NOTE — Progress Notes (Signed)
Established Patient Office Visit  Subjective   Patient ID: Brittany Byrd, female    DOB: 1971/01/21  Age: 52 y.o. MRN: [id number]  Chief Complaint  Patient presents with   Knee Pain         HPI Pt is a 52 yo obese female with acute right knee pain since fall on 2/14 where she came down hard on her right knee. She has been taking tylenol, ibuprofen, biofreeze with little improvement. She got some tramadol for breakthrough pain and seemed help.  Continues weeks later to be painful to walk and knee continues to swell.   Pt is very frustrated with her weight. She has tried eating better and just not losing weight. She got saxenda approved by insurance at one point and want to try it again.    .. Active Ambulatory Problems    Diagnosis Date Noted   Hypertension    Seasonal allergies    Migraine    Asthma, allergic 11/18/2012   Epicondylitis elbow, medial 11/04/2014   Patellofemoral syndrome 11/04/2014   Class 2 obesity due to excess calories without serious comorbidity with body mass index (BMI) of 39.0 to 39.9 in adult 03/11/2015   Neuropathy 08/28/2016   Tinnitus of right ear 08/28/2016   PMDD (premenstrual dysphoric disorder) 01/21/2017   Constipation 04/18/2017   BMI 35.0-35.9,adult 04/20/2017   Heart murmur 04/20/2017   Uterine fibroid 01/05/2019   Ovarian cyst 01/05/2019   Ketonuria 10/04/2019   Isolated proteinuria with morphologic lesion 10/04/2019   Bilirubinuria 10/04/2019   Elevated LDL cholesterol level 10/05/2019   Left flank pain 10/05/2019   Left lower quadrant guarding 10/05/2019   Recurrent major depressive disorder ([place]) 12/15/2020   Hyperlipidemia 10/30/2021   Elevated fasting glucose 10/30/2021   Panic attacks 12/13/2022   GAD (generalized anxiety disorder) 12/13/2022   Class 3 severe obesity due to excess calories without serious comorbidity with body mass index (BMI) of 40.0 to 44.9 in adult [facility]) 02/25/2023   Primary osteoarthritis of right knee  02/25/2023   Resolved Ambulatory Problems    Diagnosis Date Noted   No Resolved Ambulatory Problems   Past Medical History:  Diagnosis Date   Uterine cancer ([place])      ROS See HPI.    Objective:     BP (!) 159/97   Pulse 75   Ht 5' (1.524 m)   Wt 208 lb (94.3 kg)   SpO2 98%   BMI 40.62 kg/m  BP Readings from Last 3 Encounters:  02/25/23 (!) 159/97  12/13/22 (!) 163/89  06/17/22 (!) 143/90   Wt Readings from Last 3 Encounters:  02/25/23 208 lb (94.3 kg)  12/13/22 206 lb (93.4 kg)  06/17/22 205 lb (93 kg)      Physical Exam Constitutional:      Appearance: Normal appearance. She is obese.  Cardiovascular:     Rate and Rhythm: Normal rate and regular rhythm.  Pulmonary:     Effort: Pulmonary effort is normal.     Breath sounds: Normal breath sounds.  Musculoskeletal:     Right lower leg: No edema.     Left lower leg: No edema.     Comments: Right knee tenderness to palpation over patella and into medial knee with some minor effusion noted on patella.  No redness or warmth. NROM and 5/5 strength of lower anterior leg.   Neurological:     General: No focal deficit present.     Mental Status: She is alert.  Psychiatric:  Mood and Affect: Mood normal.      The 10-year ASCVD risk score (Arnett DK, et al., 2019) is: 4.3%    Assessment & Plan:  [name] KitchenMarland KitchenDeanna was seen today for knee pain.  Diagnoses and all orders for this visit:  Fall, initial encounter -     DG Knee Complete 4 Views Right  Acute pain of right knee -     DG Knee Complete 4 Views Right -     diclofenac (VOLTAREN) 75 MG EC tablet; Take 1 tablet (75 mg total) by mouth 2 (two) times daily.  Pain and swelling of right knee -     DG Knee Complete 4 Views Right  Class 3 severe obesity due to excess calories without serious comorbidity with body mass index (BMI) of 40.0 to 44.9 in adult (HCC) -     Liraglutide -Weight Management (SAXENDA) 18 MG/3ML SOPN; Inject 3 mg into the skin daily.  0.6 mg inj subcut daily for 1 week, then incr by 0.6 mg weekly until reaching 3 mg injected subcut daily -     Insulin Pen Needle 31G X 6 MM MISC; To use daily with saxenda.  Primary osteoarthritis of right knee -     Liraglutide -Weight Management (SAXENDA) 18 MG/3ML SOPN; Inject 3 mg into the skin daily. 0.6 mg inj subcut daily for 1 week, then incr by 0.6 mg weekly until reaching 3 mg injected subcut daily -     Insulin Pen Needle 31G X 6 MM MISC; To use daily with saxenda. -     diclofenac (VOLTAREN) 75 MG EC tablet; Take 1 tablet (75 mg total) by mouth 2 (two) times daily.  Primary hypertension -     losartan (COZAAR) 100 MG tablet; Take 1 tablet (100 mg total) by mouth daily.  Irritable bowel syndrome with diarrhea -     dicyclomine (BENTYL) 20 MG tablet; Take 1 tablet (20 mg total) by mouth 3 (three) times daily before meals.   STAT xray:   IMPRESSION: 1. Moderate 3 compartmental osteoarthritis greatest in the medial and patellofemoral compartment. 2. Moderate suprapatellar joint effusion, likely reactive. 3. No acute displaced fracture. Well corticated ossific density within the anterior right knee may reflect sequela of chronic trauma or synovial osteochondromatosis.  Start diclofean bid Consult with Dr. Darene Lamer, sports medicine to consider injection able to get her in today! Ice often    BP not to goal Increase cozaar to 100mg  daily Recheck in 1 month  .[name] KitchenDiscussed low carb diet with 1500 calories and 80g of protein.  Exercising at least 150 minutes a week.  [employer] could be a Microbiologist.  Start saxenda Pt aware of side effects Pt will come in for a nurse to help her with first injection Pt aware of titration up on dose to 3mg  Follow up in 1 month  Iran Planas, PA-C

## 2023-02-28 ENCOUNTER — Encounter: Payer: Self-pay | Admitting: Physician Assistant

## 2023-03-03 DIAGNOSIS — R7309 Other abnormal glucose: Secondary | ICD-10-CM | POA: Diagnosis not present

## 2023-03-03 DIAGNOSIS — F4323 Adjustment disorder with mixed anxiety and depressed mood: Secondary | ICD-10-CM | POA: Diagnosis not present

## 2023-03-06 ENCOUNTER — Telehealth: Payer: Self-pay

## 2023-03-06 NOTE — Telephone Encounter (Signed)
Initiated Prior authorization [id number] 18MG /3ML pen-injectors Via: Covermymeds Case/Key:[id number] Status: Pending as of 03/06/23 Reason: Notified Pt via: Mychart

## 2023-03-07 ENCOUNTER — Other Ambulatory Visit: Payer: Self-pay | Admitting: Physician Assistant

## 2023-03-07 ENCOUNTER — Telehealth: Payer: BC Managed Care – PPO

## 2023-03-07 ENCOUNTER — Telehealth: Payer: BC Managed Care – PPO | Admitting: Nurse Practitioner

## 2023-03-07 ENCOUNTER — Encounter: Payer: Self-pay | Admitting: Physician Assistant

## 2023-03-07 DIAGNOSIS — H00012 Hordeolum externum right lower eyelid: Secondary | ICD-10-CM

## 2023-03-07 MED ORDER — ERYTHROMYCIN 5 MG/GM OP OINT: 1.0000 | TOPICAL_OINTMENT | Freq: Three times a day (TID) | OPHTHALMIC | 0 refills | Status: AC

## 2023-03-07 MED ORDER — BACITRACIN-POLYMYXIN B 500-10000 UNIT/GM OP OINT: 1.0000 | TOPICAL_OINTMENT | Freq: Four times a day (QID) | OPHTHALMIC | 0 refills | Status: DC

## 2023-03-07 NOTE — Progress Notes (Signed)
  E-Visit for Stye   We are sorry that you are not feeling well. Here is how we plan to help!  Based on what you have shared with me it looks like you have a stye.  A stye is an inflammation of the eyelid.  It is often a red, painful lump near the edge of the eyelid that may look like a boil or a pimple.  A stye develops when an infection occurs at the base of an eyelash.   We have made appropriate suggestions for you based upon your presentation: Simple styes can be treated without medical intervention.  Most styes either resolve spontaneously or resolve with simple home treatment by applying warm compresses or heated washcloth to the stye for about 10-15 minutes three to four times a day. This causes the stye to drain and resolve. We will also send in a prescription antibiotic ointment to use to the area effected.  It is most useful to start with a warm compress and then apply the antibiotic ointment for relief  Meds ordered this encounter  Medications   bacitracin-polymyxin b (POLYSPORIN) ophthalmic ointment    Sig: Place 1 Application into the right eye 4 (four) times daily.    Dispense:  3.5 g    Refill:  0     HOME CARE:  Wash your hands often! Let the stye open on its own. Don't squeeze or open it. Don't rub your eyes. This can irritate your eyes and let in bacteria.  If you need to touch your eyes, wash your hands first. Don't wear eye makeup or contact lenses until the area has healed.  GET HELP RIGHT AWAY IF:  Your symptoms do not improve. You develop blurred or loss of vision. Your symptoms worsen (increased discharge, pain or redness).   Thank you for choosing an e-visit.  Your e-visit answers were reviewed by a board certified advanced clinical practitioner to complete your personal care plan. Depending upon the condition, your plan could have included both over the counter or prescription medications.  Please review your pharmacy choice. Make sure the pharmacy is  open so you can pick up prescription now. If there is a problem, you may contact your provider through [employer] and have the prescription routed to another pharmacy.  Your safety is important to [place]. If you have drug allergies check your prescription carefully.   For the next 24 hours you can use MyChart to ask questions about today's visit, request a non-urgent call back, or ask for a work or school excuse. You will get an email in the next two days asking about your experience. I hope that your e-visit has been valuable and will speed your recovery.   I spent approximately 5 minutes reviewing the patient's history, current symptoms and coordinating their care today.

## 2023-03-11 ENCOUNTER — Other Ambulatory Visit: Payer: Self-pay | Admitting: Physician Assistant

## 2023-03-11 DIAGNOSIS — I10 Essential (primary) hypertension: Secondary | ICD-10-CM

## 2023-03-17 DIAGNOSIS — F4323 Adjustment disorder with mixed anxiety and depressed mood: Secondary | ICD-10-CM | POA: Diagnosis not present

## 2023-03-25 ENCOUNTER — Ambulatory Visit (INDEPENDENT_AMBULATORY_CARE_PROVIDER_SITE_OTHER): Payer: BC Managed Care – PPO | Admitting: Physician Assistant

## 2023-03-25 ENCOUNTER — Other Ambulatory Visit: Payer: Self-pay | Admitting: Physician Assistant

## 2023-03-25 ENCOUNTER — Encounter: Payer: Self-pay | Admitting: Physician Assistant

## 2023-03-25 VITALS — BP 160/90 | HR 100 | Ht 60.0 in

## 2023-03-25 DIAGNOSIS — F411 Generalized anxiety disorder: Secondary | ICD-10-CM

## 2023-03-25 DIAGNOSIS — I10 Essential (primary) hypertension: Secondary | ICD-10-CM | POA: Diagnosis not present

## 2023-03-25 DIAGNOSIS — F410 Panic disorder [episodic paroxysmal anxiety] without agoraphobia: Secondary | ICD-10-CM

## 2023-03-25 DIAGNOSIS — M1711 Unilateral primary osteoarthritis, right knee: Secondary | ICD-10-CM

## 2023-03-25 DIAGNOSIS — F339 Major depressive disorder, recurrent, unspecified: Secondary | ICD-10-CM

## 2023-03-25 MED ORDER — SERTRALINE HCL 50 MG PO TABS: 50.0000 mg | ORAL_TABLET | Freq: Every day | ORAL | 1 refills | Status: DC

## 2023-03-25 MED ORDER — HYDROCHLOROTHIAZIDE 12.5 MG PO TABS: 12.5000 mg | ORAL_TABLET | Freq: Every day | ORAL | 1 refills | Status: DC

## 2023-03-25 NOTE — Progress Notes (Signed)
Established Patient Office Visit  Subjective   Patient ID: Brittany Byrd, female    DOB: March 04, 1971  Age: 52 y.o. MRN: [id number]  Chief Complaint  Patient presents with   Follow-up    HPI Pt is a 52 yo obese female who presents to the clinic to follow up on GAD, MDD, HTN and weight.   Pt has not been able to start saxenda due stock issues. She has not started.   She is going to counseling every 2 weeks. Zoloft  is helping with anxiety, depression and panic. She does feel like her job is a big source of anxiety. Counseling is helping but she often feels like she might have panic attack.   BP been elevated. Taking cozaar daily. Not checking BP at home. Denies any CP, palpitations, headaches or vision changes.    .. Active Ambulatory Problems    Diagnosis Date Noted   Hypertension    Seasonal allergies    Migraine    Asthma, allergic 11/18/2012   Epicondylitis elbow, medial 11/04/2014   Patellofemoral syndrome 11/04/2014   Class 2 obesity due to excess calories without serious comorbidity with body mass index (BMI) of 39.0 to 39.9 in adult 03/11/2015   Neuropathy 08/28/2016   Tinnitus of right ear 08/28/2016   PMDD (premenstrual dysphoric disorder) 01/21/2017   Constipation 04/18/2017   BMI 35.0-35.9,adult 04/20/2017   Heart murmur 04/20/2017   Uterine fibroid 01/05/2019   Ovarian cyst 01/05/2019   Ketonuria 10/04/2019   Isolated proteinuria with morphologic lesion 10/04/2019   Bilirubinuria 10/04/2019   Elevated LDL cholesterol level 10/05/2019   Left flank pain 10/05/2019   Left lower quadrant guarding 10/05/2019   Recurrent major depressive disorder 12/15/2020   Hyperlipidemia 10/30/2021   Elevated fasting glucose 10/30/2021   Panic attacks 12/13/2022   GAD (generalized anxiety disorder) 12/13/2022   Class 3 severe obesity due to excess calories without serious comorbidity with body mass index (BMI) of 40.0 to 44.9 in adult 02/25/2023   Primary osteoarthritis  of right knee 02/25/2023   Resolved Ambulatory Problems    Diagnosis Date Noted   No Resolved Ambulatory Problems   Past Medical History:  Diagnosis Date   Uterine cancer      ROS   See HPI.  Objective:     BP (!) 160/90   Pulse 100   Ht 5' (1.524 m)   SpO2 99%   BMI 40.62 kg/m  BP Readings from Last 3 Encounters:  03/25/23 (!) 160/90  02/25/23 (!) 159/97  12/13/22 (!) 163/89   Wt Readings from Last 3 Encounters:  02/25/23 208 lb (94.3 kg)  12/13/22 206 lb (93.4 kg)  06/17/22 205 lb (93 kg)    ..    12/13/2022    3:20 PM 12/15/2020   11:04 AM 10/04/2019   10:03 AM 02/25/2018    9:39 AM 04/18/2017    9:48 AM  Depression screen PHQ 2/9  Decreased Interest 0  Down, Depressed, Hopeless 0  PHQ - 2 Score 0  Altered sleeping Tired, decreased energy Change in appetite Feeling bad or failure about yourself  Trouble concentrating Moving slowly or fidgety/restless 1 1 0 1   Suicidal thoughts 0 0 0 2   PHQ-9 Score 22 17 7  16   Difficult doing work/chores Very difficult Somewhat difficult Not difficult at all Very difficult    ..    12/13/2022    3:21 PM 12/15/2020   11:05 AM 10/04/2019   10:04 AM  GAD 7 : Generalized Anxiety Score  Nervous, Anxious, on Edge Control/stop worrying Worry too much - different things 3  1  Trouble relaxing Restless 2 2 0  Easily annoyed or irritable Afraid - awful might happen 3  1  Total GAD 7 Score 19  6  Anxiety Difficulty Very difficult Somewhat difficult Somewhat difficult      Physical Exam Constitutional:      Appearance: Normal appearance. She is obese.  HENT:     Head: Normocephalic.  Neck:     Vascular: No carotid bruit.  Cardiovascular:     Rate and Rhythm: Normal rate and regular rhythm.     Heart sounds: Murmur heard.  Pulmonary:     Effort: Pulmonary effort is normal.     Breath sounds: Normal breath  sounds.  Musculoskeletal:     Cervical back: Normal range of motion and neck supple.     Right lower leg: No edema.     Left lower leg: No edema.  Lymphadenopathy:     Cervical: No cervical adenopathy.  Neurological:     General: No focal deficit present.     Mental Status: She is alert and oriented to person, place, and time.  Psychiatric:        Mood and Affect: Mood normal.        The 10-year ASCVD risk score (Arnett DK, et al., 2019) is: 4.3%    Assessment & Plan:  [name] KitchenMarland KitchenDeanna was seen today for follow-up.  Diagnoses and all orders for this visit:  GAD (generalized anxiety disorder) -     sertraline (ZOLOFT) 50 MG tablet; Take 1 tablet (50 mg total) by mouth daily.  Recurrent major depressive disorder, remission status unspecified -     sertraline (ZOLOFT) 50 MG tablet; Take 1 tablet (50 mg total) by mouth daily.  Panic attacks  Primary hypertension -     hydrochlorothiazide (HYDRODIURIL) 12.5 MG tablet; Take 1 tablet (12.5 mg total) by mouth daily.  PHQ/GAD not to goal Continue counseling Discussed increasing zoloft, pt declined Follow up in 4 weeks  BP not to goal Continue to work on weight loss Call around to see where can get saxenda filled Continue losartan Added HCTZ Start checking BP at home Goal under 140/90 Follow up in 4 weeks   Tandy Gaw, PA-C

## 2023-04-02 DIAGNOSIS — R7309 Other abnormal glucose: Secondary | ICD-10-CM | POA: Diagnosis not present

## 2023-04-08 ENCOUNTER — Ambulatory Visit (INDEPENDENT_AMBULATORY_CARE_PROVIDER_SITE_OTHER): Payer: BC Managed Care – PPO | Admitting: Sports Medicine

## 2023-04-08 ENCOUNTER — Telehealth: Payer: Self-pay | Admitting: Sports Medicine

## 2023-04-08 DIAGNOSIS — S83203D Other tear of unspecified meniscus, current injury, right knee, subsequent encounter: Secondary | ICD-10-CM | POA: Diagnosis not present

## 2023-04-08 DIAGNOSIS — M1711 Unilateral primary osteoarthritis, right knee: Secondary | ICD-10-CM | POA: Diagnosis not present

## 2023-04-08 NOTE — Progress Notes (Signed)
    Procedures performed today:    None.  Independent interpretation of notes and tests performed by another provider:   None.  Brief History, Exam, Impression, and Recommendations:    Primary osteoarthritis of right knee Pleasant 52 year old female returns, chronic right knee pain with x-ray confirmed osteoarthritis failed NSAIDs, we injected her knee at the last visit, she has had dramatic improvement but still has some discomfort under the kneecap, she does complain of locking and catching. On exam she does not have an effusion but she does have significant patellar crepitus with painful patellar compression. Proceeding with MRI to evaluate for any meniscal tearing that needs to be addressed arthroscopically, we will also have her do some formal physical therapy. I would also to get her approved for [facility].    ____________________________________________ Ihor Austin. Benjamin Stain, M.D., ABFM., CAQSM., AME. Primary Care and Sports Medicine [facility]  Adjunct Professor of Family Medicine  [place] [employer]  Restaurant manager, fast food

## 2023-04-08 NOTE — Assessment & Plan Note (Signed)
Pleasant 52 year old female returns, chronic right knee pain with x-ray confirmed osteoarthritis failed NSAIDs, we injected her knee at the last visit, she has had dramatic improvement but still has some discomfort under the kneecap, she does complain of locking and catching. On exam she does not have an effusion but she does have significant patellar crepitus with painful patellar compression. Proceeding with MRI to evaluate for any meniscal tearing that needs to be addressed arthroscopically, we will also have her do some formal physical therapy. I would also to get her approved for [facility].

## 2023-04-08 NOTE — Telephone Encounter (Signed)
[employer] approval please, right knee, x-ray confirmed, failed greater than 6 weeks of physical therapy

## 2023-04-09 NOTE — Telephone Encounter (Signed)
PA information submitted via [email or web address] for [facility] Paperwork has been printed and given to Dr. T for signatures. Once obtained, information will be faxed to [employer] at [phone]  

## 2023-04-13 ENCOUNTER — Ambulatory Visit (INDEPENDENT_AMBULATORY_CARE_PROVIDER_SITE_OTHER): Payer: BC Managed Care – PPO

## 2023-04-13 DIAGNOSIS — S83241A Other tear of medial meniscus, current injury, right knee, initial encounter: Secondary | ICD-10-CM | POA: Diagnosis not present

## 2023-04-13 DIAGNOSIS — S83203D Other tear of unspecified meniscus, current injury, right knee, subsequent encounter: Secondary | ICD-10-CM | POA: Diagnosis not present

## 2023-04-14 DIAGNOSIS — F4323 Adjustment disorder with mixed anxiety and depressed mood: Secondary | ICD-10-CM | POA: Diagnosis not present

## 2023-04-14 NOTE — Therapy (Deleted)
OUTPATIENT PHYSICAL THERAPY LOWER EXTREMITY EVALUATION   Patient Name: Brittany Byrd MRN: [id number] DOB:10-Apr-1971, 52 y.o., female Today's Date: 04/14/2023  END OF SESSION:   Past Medical History:  Diagnosis Date   Hypertension    Migraine    Seasonal allergies    Uterine cancer (HCC)    Past Surgical History:  Procedure Laterality Date   DILATION AND CURETTAGE OF UTERUS     NASAL SEPTUM SURGERY     Patient Active Problem List   Diagnosis Date Noted   Class 3 severe obesity due to excess calories without serious comorbidity with body mass index (BMI) of 40.0 to 44.9 in adult (HCC) 02/25/2023   Primary osteoarthritis of right knee 02/25/2023   Panic attacks 12/13/2022   GAD (generalized anxiety disorder) 12/13/2022   Hyperlipidemia 10/30/2021   Elevated fasting glucose 10/30/2021   Recurrent major depressive disorder (HCC) 12/15/2020   Elevated LDL cholesterol level 10/05/2019   Left flank pain 10/05/2019   Left lower quadrant guarding 10/05/2019   Ketonuria 10/04/2019   Isolated proteinuria with morphologic lesion 10/04/2019   Bilirubinuria 10/04/2019   Uterine fibroid 01/05/2019   Ovarian cyst 01/05/2019   BMI 35.0-35.9,adult 04/20/2017   Heart murmur 04/20/2017   Constipation 04/18/2017   PMDD (premenstrual dysphoric disorder) 01/21/2017   Neuropathy 08/28/2016   Tinnitus of right ear 08/28/2016   Class 2 obesity due to excess calories without serious comorbidity with body mass index (BMI) of 39.0 to 39.9 in adult 03/11/2015   Epicondylitis elbow, medial 11/04/2014   Patellofemoral syndrome 11/04/2014   Asthma, allergic 11/18/2012   Hypertension    Seasonal allergies    Migraine     PCP: Tandy Gaw, [facility]  REFERRING PROVIDER: Dr Maisie Fus; [place]  REFERRING DIAG: Primary OA Rt knee   THERAPY DIAG:  No diagnosis found.  Rationale for Evaluation and Treatment: Rehabilitation  ONSET DATE: ***  SUBJECTIVE:   SUBJECTIVE  STATEMENT: ***  PERTINENT HISTORY: *** PAIN:  Are you having pain? Yes: NPRS scale: ***/10 Pain location: *** Pain description: *** Aggravating factors: *** Relieving factors: ***  PRECAUTIONS: None  WEIGHT BEARING RESTRICTIONS: No  FALLS:  Has patient fallen in last 6 months? {fallsyesno:27318}  LIVING ENVIRONMENT: Lives with: lives with their spouse Lives in: House/apartment Stairs: {opstairs:27293} Has following equipment at home: {Assistive devices:23999}  OCCUPATION: ***  PLOF: Independent  PATIENT GOALS: ***  NEXT MD VISIT: 04/25/23  OBJECTIVE:   DIAGNOSTIC FINDINGS:  Xray Rt knee 02/25/23: IMPRESSION: 1. Moderate 3 compartmental osteoarthritis greatest in the medial and patellofemoral compartment. 2. Moderate suprapatellar joint effusion, likely reactive. 3. No acute displaced fracture. Well corticated ossific density within the anterior right knee may reflect sequela of chronic trauma or synovial osteochondromatosis.  PATIENT SURVEYS:  FOTO   COGNITION: Overall cognitive status: Within functional limits for tasks assessed     SENSATION: {sensation:27233}  EDEMA:  {edema:24020}  MUSCLE LENGTH: Hamstrings: Right *** deg; Left *** deg Maisie Fus test: Right *** deg; Left *** deg  POSTURE: {posture:25561}  PALPATION: ***  LOWER EXTREMITY ROM:  Active ROM Right eval Left eval  Hip flexion    Hip extension    Hip abduction    Hip adduction    Hip internal rotation    Hip external rotation    Knee flexion    Knee extension    Ankle dorsiflexion    Ankle plantarflexion    Ankle inversion    Ankle eversion     (Blank rows = not tested)  LOWER EXTREMITY  MMT:  MMT Right eval Left eval  Hip flexion    Hip extension    Hip abduction    Hip adduction    Hip internal rotation    Hip external rotation    Knee flexion    Knee extension    Ankle dorsiflexion    Ankle plantarflexion    Ankle inversion    Ankle eversion     (Blank  rows = not tested)  LOWER EXTREMITY SPECIAL TESTS:  {LEspecialtests:26242}  FUNCTIONAL TESTS:  5 times sit to stand:   GAIT: Distance walked: *** Assistive device utilized: {Assistive devices:23999} Level of assistance: {Levels of assistance:24026} Comments: ***  [facility] Adult PT Treatment:                                                DATE: *** Therapeutic Exercise: *** Manual Therapy: *** Neuromuscular re-ed: *** Therapeutic Activity: *** Gait: *** Modalities: *** Self Care: ***    PATIENT EDUCATION:  Education details: POC; HEP  Person educated: Patient Education method: Explanation, Demonstration, Tactile cues, Verbal cues, and Handouts Education comprehension: verbalized understanding, returned demonstration, verbal cues required, tactile cues required, and needs further education  HOME EXERCISE PROGRAM: ***  ASSESSMENT:  CLINICAL IMPRESSION: Patient is a 52 y.o. female who was seen today for physical therapy evaluation and treatment for Rt knee pain .   OBJECTIVE IMPAIRMENTS: Abnormal gait, decreased activity tolerance, decreased balance, decreased mobility, difficulty walking, decreased ROM, decreased strength, increased muscle spasms, impaired flexibility, improper body mechanics, postural dysfunction, and pain.   ACTIVITY LIMITATIONS: carrying, lifting, bending, standing, squatting, stairs, and transfers  PARTICIPATION LIMITATIONS: meal prep, cleaning, laundry, shopping, and community activity  PERSONAL FACTORS: Past/current experiences and Time since onset of injury/illness/exacerbation are also affecting patient's functional outcome.   REHAB POTENTIAL: Good  CLINICAL DECISION MAKING: Stable/uncomplicated  EVALUATION COMPLEXITY: Low   GOALS: Goals reviewed with patient? Yes  SHORT TERM GOALS: Target date: *** *** Baseline: Goal status: INITIAL  2.  *** Baseline:  Goal status: INITIAL  3.  *** Baseline:  Goal status:  {GOALSTATUS:25110}  LONG TERM GOALS: Target date: ***  *** Baseline:  Goal status: INITIAL  2.  *** Baseline:  Goal status: INITIAL  3.  *** Baseline:  Goal status: INITIAL  4.  *** Baseline:  Goal status: INITIAL  5.  *** Baseline:  Goal status: INITIAL  6.  *** Baseline:  Goal status: INITIAL   PLAN:  PT FREQUENCY: 2x/week  PT DURATION: 8 weeks  PLANNED INTERVENTIONS: Therapeutic exercises, Therapeutic activity, Neuromuscular re-education, Balance training, Gait training, Patient/Family education, Self Care, Joint mobilization, Stair training, Aquatic Therapy, Dry Needling, Electrical stimulation, Cryotherapy, Moist heat, Taping, Vasopneumatic device, Ultrasound, Ionotophoresis 4mg /ml Dexamethasone, Manual therapy, and Re-evaluation  PLAN FOR NEXT SESSION: review and progress exercises; gait training as indicated; manual work, DN, modalities as indicated    [employer], PT 04/14/2023, 10:04 AM

## 2023-04-16 ENCOUNTER — Ambulatory Visit: Payer: BC Managed Care – PPO | Admitting: Rehabilitative and Restorative Service Providers"

## 2023-04-17 ENCOUNTER — Ambulatory Visit: Payer: BC Managed Care – PPO | Attending: Sports Medicine | Admitting: Physical Therapy

## 2023-04-17 ENCOUNTER — Encounter: Payer: Self-pay | Admitting: Sports Medicine

## 2023-04-17 ENCOUNTER — Encounter: Payer: Self-pay | Admitting: Physical Therapy

## 2023-04-17 ENCOUNTER — Other Ambulatory Visit: Payer: Self-pay

## 2023-04-17 DIAGNOSIS — M1711 Unilateral primary osteoarthritis, right knee: Secondary | ICD-10-CM | POA: Insufficient documentation

## 2023-04-17 DIAGNOSIS — M25661 Stiffness of right knee, not elsewhere classified: Secondary | ICD-10-CM | POA: Diagnosis present

## 2023-04-17 DIAGNOSIS — M6281 Muscle weakness (generalized): Secondary | ICD-10-CM

## 2023-04-17 DIAGNOSIS — M25561 Pain in right knee: Secondary | ICD-10-CM | POA: Diagnosis not present

## 2023-04-17 DIAGNOSIS — R2689 Other abnormalities of gait and mobility: Secondary | ICD-10-CM | POA: Diagnosis present

## 2023-04-17 NOTE — Therapy (Signed)
OUTPATIENT PHYSICAL THERAPY LOWER EXTREMITY EVALUATION   Patient Name: Brittany Byrd MRN: [id number] DOB:11-18-1971, 52 y.o., female Today's Date: 04/17/2023  END OF SESSION:  PT End of Session - 04/17/23 0809     Visit Number 1    Number of Visits 16    Date for PT Re-Evaluation 05/29/23    Authorization Type [employer]    PT Start Time 0809   late arrival   PT Stop Time 0845    PT Time Calculation (min) 36 min    Activity Tolerance Patient tolerated treatment well    Behavior During Therapy WFL for tasks assessed/performed             Past Medical History:  Diagnosis Date   Hypertension    Migraine    Seasonal allergies    Uterine cancer (HCC)    Past Surgical History:  Procedure Laterality Date   DILATION AND CURETTAGE OF UTERUS     NASAL SEPTUM SURGERY     Patient Active Problem List   Diagnosis Date Noted   Class 3 severe obesity due to excess calories without serious comorbidity with body mass index (BMI) of 40.0 to 44.9 in adult (HCC) 02/25/2023   Primary osteoarthritis of right knee 02/25/2023   Panic attacks 12/13/2022   GAD (generalized anxiety disorder) 12/13/2022   Hyperlipidemia 10/30/2021   Elevated fasting glucose 10/30/2021   Recurrent major depressive disorder (HCC) 12/15/2020   Elevated LDL cholesterol level 10/05/2019   Left flank pain 10/05/2019   Left lower quadrant guarding 10/05/2019   Ketonuria 10/04/2019   Isolated proteinuria with morphologic lesion 10/04/2019   Bilirubinuria 10/04/2019   Uterine fibroid 01/05/2019   Ovarian cyst 01/05/2019   BMI 35.0-35.9,adult 04/20/2017   Heart murmur 04/20/2017   Constipation 04/18/2017   PMDD (premenstrual dysphoric disorder) 01/21/2017   Neuropathy 08/28/2016   Tinnitus of right ear 08/28/2016   Class 2 obesity due to excess calories without serious comorbidity with body mass index (BMI) of 39.0 to 39.9 in adult 03/11/2015   Epicondylitis elbow, medial 11/04/2014   Patellofemoral syndrome  11/04/2014   Asthma, allergic 11/18/2012   Hypertension    Seasonal allergies    Migraine     PCP: Tandy Gaw, PA  REFERRING PROVIDER: Monica Becton, MD  REFERRING DIAG: M17.11 (ICD-10-CM) - Primary osteoarthritis of right knee  THERAPY DIAG:  Acute pain of right knee  Stiffness of right knee, not elsewhere classified  Muscle weakness (generalized)  Other abnormalities of gait and mobility  Rationale for Evaluation and Treatment: Rehabilitation  ONSET DATE: 01/15/23 fall on R knee  SUBJECTIVE:   SUBJECTIVE STATEMENT: Pt reports her R knee has been bad "forever." Pt falls on it a lot. Larey Seat on it on Valentine's day. It hurt so bad and she was unable to recover. Pt got a shot and it helped some. Pt states she got MRI but hasn't been read yet. Stairs has continued to bother her knee. Pt reports it hurts walking but feels better later.   PERTINENT HISTORY: Chronic R knee pain  PAIN:  Are you having pain? Yes: NPRS scale: Prior to the fall her pain was a constant 2/10, now it's a steady 5/10 at times up to a 06/10/09 Pain location: front of R knee Pain description: "like it's on fire" Aggravating factors: stairs Relieving factors: Ice, pain medication  PRECAUTIONS: None  WEIGHT BEARING RESTRICTIONS: No  FALLS:  Has patient fallen in last 6 months? Yes. 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, television/film/video (mostly at a desk)  PLOF: Independent  PATIENT GOALS: Improve pain  NEXT MD VISIT: n/a  OBJECTIVE:   DIAGNOSTIC FINDINGS: MRI on 04/13/23 but not yet read  PATIENT SURVEYS:  FOTO 49; predicted 64  COGNITION: Overall cognitive status: Within functional limits for tasks assessed     SENSATION: WFL  EDEMA:  Nothing  currently  MUSCLE LENGTH: Hamstrings: Right 60 deg; Left 80 deg Thomas test: did not assess  POSTURE: weight shift left and R knee slightly flexed  PALPATION: TTP R glute med and quads Hypomobile patella R vs L  LOWER EXTREMITY ROM:  Active ROM Right eval Left eval  Hip flexion    Hip extension    Hip abduction    Hip adduction    Hip internal rotation    Hip external rotation    Knee flexion 115 120  Knee extension -10 0  Ankle dorsiflexion    Ankle plantarflexion    Ankle inversion    Ankle eversion     (Blank rows = not tested)  LOWER EXTREMITY MMT:  MMT Right eval Left eval  Hip flexion 3+ 5  Hip extension 3 3+  Hip abduction 3+ 3+  Hip adduction    Hip internal rotation    Hip external rotation    Knee flexion 3+ 4  Knee extension 3+ 4-  Ankle dorsiflexion    Ankle plantarflexion    Ankle inversion    Ankle eversion     (Blank rows = not tested)  LOWER EXTREMITY SPECIAL TESTS:  Did not test  FUNCTIONAL TESTS:  5 times sit to stand: 11.11 sec SLS: L LE 5 sec, R LE 3 sec Stairs: ascends with step to pattern L LE leading; descends with step to pattern L LE leading but goes down side ways   GAIT: Distance walked: 100' Assistive device utilized: None Level of assistance: Complete Independence Comments: compensatory trunk lean on R with stance phase   TODAY'S TREATMENT:                                                                                                                              DATE: 04/17/23 See HEP below    PATIENT EDUCATION:  Education details: Exam findings, POC, initial HEP Person educated: Patient Education method: Explanation, Demonstration, and Handouts Education comprehension: verbalized understanding, returned demonstration, and needs further education  HOME EXERCISE PROGRAM: Access Code: [email or web address] URL: [email or web address] Date: 04/17/2023 Prepared by: Vernon Prey April Kirstie Peri  Exercises - Supine  Active Straight Leg Raise  - 1 x daily - 7 x weekly - 2 sets - 10 reps - Clamshell  - 1 x daily - 7 x weekly - 2 sets - 10 reps - Supine Bridge  -  1 x daily - 7 x weekly - 2 sets - 10 reps  ASSESSMENT:  CLINICAL IMPRESSION: Patient is a 52 y.o. F who was seen today for physical therapy evaluation and treatment for R knee pain. Pt reports history of multiple falls on her R knee with chronic pain acutely exacerbated after a fall on Valentine's Day. Assessment significant for gross R LE weakness leading to decreased LE stability/balance affecting gait and home/community mobility. Pt would benefit from PT to address these deficits.   OBJECTIVE IMPAIRMENTS: Abnormal gait, decreased activity tolerance, decreased balance, decreased endurance, decreased mobility, difficulty walking, decreased ROM, decreased strength, increased edema, increased fascial restrictions, impaired flexibility, improper body mechanics, postural dysfunction, and pain.   ACTIVITY LIMITATIONS: standing, squatting, stairs, transfers, locomotion level, and caring for others  PARTICIPATION LIMITATIONS: cleaning, laundry, shopping, community activity, and yard work  PERSONAL FACTORS: Age, Fitness, Past/current experiences, and Time since onset of injury/illness/exacerbation are also affecting patient's functional outcome.   REHAB POTENTIAL: Good  CLINICAL DECISION MAKING: Stable/uncomplicated  EVALUATION COMPLEXITY: Low   GOALS: Goals reviewed with patient? Yes  SHORT TERM GOALS: Target date: 05/15/2023  Pt will be ind with initial HEP Baseline: Goal status: INITIAL  2.  Pt will demo L = R knee ROM Baseline:  Goal status: INITIAL   LONG TERM GOALS: Target date: 06/12/2023   Pt will be ind with management and progression of HEP Baseline:  Goal status: INITIAL  2.  Pt will be able to ascend/descend 10 stairs with reciprocal pattern Baseline:  Goal status: INITIAL  3.  Pt will be able to perform SLS on R and L  LE for at least 10 sec to demo improving LE stability and balance Baseline:  Goal status: INITIAL  4.  Pt will report decrease in knee pain to </=3/10 to be closer to her prior status Baseline:  Goal status: INITIAL  5.  Pt will have improved FOTO score to >/=64 Baseline:  Goal status: INITIAL   PLAN:  PT FREQUENCY: 2x/week  PT DURATION: 8 weeks  PLANNED INTERVENTIONS: Therapeutic exercises, Therapeutic activity, Neuromuscular re-education, Balance training, Gait training, Patient/Family education, Self Care, Joint mobilization, Stair training, Aquatic Therapy, Dry Needling, Electrical stimulation, Cryotherapy, Moist heat, Taping, Vasopneumatic device, Ionotophoresis 4mg /ml Dexamethasone, Manual therapy, and Re-evaluation  PLAN FOR NEXT SESSION: Assess response to HEP. Work on gross LE strengthening. Manual work for [employer] April Ma L Emerald Shor, PT 04/17/2023, 9:30 AM

## 2023-04-21 ENCOUNTER — Ambulatory Visit: Payer: BC Managed Care – PPO

## 2023-04-21 DIAGNOSIS — M25661 Stiffness of right knee, not elsewhere classified: Secondary | ICD-10-CM | POA: Diagnosis not present

## 2023-04-21 DIAGNOSIS — R2689 Other abnormalities of gait and mobility: Secondary | ICD-10-CM | POA: Diagnosis not present

## 2023-04-21 DIAGNOSIS — M25561 Pain in right knee: Secondary | ICD-10-CM

## 2023-04-21 DIAGNOSIS — M1711 Unilateral primary osteoarthritis, right knee: Secondary | ICD-10-CM | POA: Diagnosis not present

## 2023-04-21 DIAGNOSIS — M6281 Muscle weakness (generalized): Secondary | ICD-10-CM

## 2023-04-21 NOTE — Therapy (Signed)
OUTPATIENT PHYSICAL THERAPY LOWER EXTREMITY TREATMENT   Patient Name: Brittany Byrd MRN: [id number] DOB:[birth year], 52 y.o., female Today's Date: 04/21/2023  END OF SESSION:  PT End of Session - 04/21/23 1449     Visit Number 2    Number of Visits 16    Date for PT Re-Evaluation 06/12/23    Authorization Type [employer]    PT Start Time 1450    PT Stop Time 1530    PT Time Calculation (min) 40 min    Activity Tolerance Patient tolerated treatment well    Behavior During Therapy WFL for tasks assessed/performed             Past Medical History:  Diagnosis Date   Hypertension    Migraine    Seasonal allergies    Uterine cancer (HCC)    Past Surgical History:  Procedure Laterality Date   DILATION AND CURETTAGE OF UTERUS     NASAL SEPTUM SURGERY     Patient Active Problem List   Diagnosis Date Noted   Class 3 severe obesity due to excess calories without serious comorbidity with body mass index (BMI) of 40.0 to 44.9 in adult (HCC) 02/25/2023   Primary osteoarthritis of right knee 02/25/2023   Panic attacks 12/13/2022   GAD (generalized anxiety disorder) 12/13/2022   Hyperlipidemia 10/30/2021   Elevated fasting glucose 10/30/2021   Recurrent major depressive disorder (HCC) 12/15/2020   Elevated LDL cholesterol level 10/05/2019   Left flank pain 10/05/2019   Left lower quadrant guarding 10/05/2019   Ketonuria 10/04/2019   Isolated proteinuria with morphologic lesion 10/04/2019   Bilirubinuria 10/04/2019   Uterine fibroid 01/05/2019   Ovarian cyst 01/05/2019   BMI 35.0-35.9,adult 04/20/2017   Heart murmur 04/20/2017   Constipation 04/18/2017   PMDD (premenstrual dysphoric disorder) 01/21/2017   Neuropathy 08/28/2016   Tinnitus of right ear 08/28/2016   Class 2 obesity due to excess calories without serious comorbidity with body mass index (BMI) of 39.0 to 39.9 in adult 03/11/2015   Epicondylitis elbow, medial 11/04/2014   Patellofemoral syndrome 11/04/2014   Asthma,  allergic 11/18/2012   Hypertension    Seasonal allergies    Migraine     PCP: Tandy Gaw, PA  REFERRING PROVIDER: Monica Becton, MD  REFERRING DIAG: M17.11 (ICD-10-CM) - Primary osteoarthritis of right knee  THERAPY DIAG:  Acute pain of right knee  Stiffness of right knee, not elsewhere classified  Muscle weakness (generalized)  Other abnormalities of gait and mobility  Rationale for Evaluation and Treatment: Rehabilitation  ONSET DATE: 01/15/23 fall on R knee  SUBJECTIVE:   SUBJECTIVE STATEMENT: Patient reports she received MRI results that showed degenerative tearing of medial meniscus on R knee. Patient reports 6/10 pain today in R knee, states she took voltran tablet prior to therapy.  PERTINENT HISTORY: Chronic R knee pain  PAIN:  Are you having pain? Yes: NPRS scale: Prior to the fall her pain was a constant 2/10, now it's a steady 5/10 at times up to a 06/10/09 Pain location: front of R knee Pain description: "like it's on fire" Aggravating factors: stairs Relieving factors: Ice, pain medication  PRECAUTIONS: None  WEIGHT BEARING RESTRICTIONS: No  FALLS:  Has patient fallen in last 6 months? Yes. 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)  PLOF: Independent  PATIENT GOALS: Improve pain  NEXT MD VISIT: n/a  OBJECTIVE:   DIAGNOSTIC FINDINGS: MRI on 04/13/23 but not yet read  PATIENT SURVEYS:  FOTO 49; predicted 64  COGNITION: Overall cognitive status: Within functional limits for tasks assessed     SENSATION: WFL  EDEMA:  Nothing currently  MUSCLE LENGTH: Hamstrings: Right 60 deg; Left 80 deg Thomas test: did not assess  POSTURE: weight shift left and R knee slightly  flexed  PALPATION: TTP R glute med and quads Hypomobile patella R vs L  LOWER EXTREMITY ROM:  Active ROM Right eval Left eval  Hip flexion    Hip extension    Hip abduction    Hip adduction    Hip internal rotation    Hip external rotation    Knee flexion 115 120  Knee extension -10 0  Ankle dorsiflexion    Ankle plantarflexion    Ankle inversion    Ankle eversion     (Blank rows = not tested)  LOWER EXTREMITY MMT:  MMT Right eval Left eval  Hip flexion 3+ 5  Hip extension 3 3+  Hip abduction 3+ 3+  Hip adduction    Hip internal rotation    Hip external rotation    Knee flexion 3+ 4  Knee extension 3+ 4-  Ankle dorsiflexion    Ankle plantarflexion    Ankle inversion    Ankle eversion     (Blank rows = not tested)  LOWER EXTREMITY SPECIAL TESTS:  Did not test  FUNCTIONAL TESTS:  5 times sit to stand: 11.11 sec SLS: L LE 5 sec, R LE 3 sec Stairs: ascends with step to pattern L LE leading; descends with step to pattern L LE leading but goes down side ways   GAIT: Distance walked: 100' Assistive device utilized: None Level of assistance: Complete Independence Comments: compensatory trunk lean on R with stance phase   TODAY'S TREATMENT:                                                                                                                              [facility] Adult PT Treatment:                                                DATE: 04/21/2023 Therapeutic Exercise: Mat Table: Quad set (towel roll) 10x5" SAQ green bolster 10x5" SLR small range: parallel, ER, IR 10x5" each Supine R HS/ITB stretches w/strap Hooklying hip add ball squeeze --> bridges + ball squeeze 2x10 S/L clamshells RTB 2x10   DATE: 04/17/23 See HEP below    PATIENT EDUCATION:  Education details: Updated HEP Person educated: Patient Education method: Explanation, Demonstration, and Handouts Education comprehension: verbalized understanding, returned demonstration, and needs  further education  HOME EXERCISE PROGRAM: Access  Code: [email or web address] URL: [email or web address] Date: 04/21/2023 Prepared by: Carlynn Herald  Exercises - Supine Quad Set  - 1 x daily - 7 x weekly - 3 sets - 10 reps - Supine Knee Extension Strengthening  - 1 x daily - 7 x weekly - 3 sets - 10 reps - Small Range Straight Leg Raise  - 1 x daily - 7 x weekly - 3 sets - 10 reps - Supine Bridge  - 1 x daily - 7 x weekly - 2 sets - 10 reps - Clam with Resistance  - 1 x daily - 7 x weekly - 3 sets - 10 reps  ASSESSMENT:  CLINICAL IMPRESSION: Quad and glute strengthening progressed with resistance and increases repetitions. Initial discomfort in knee with quad sets however decreased with repetition and progression of quad activation with SAQ and SLR.  OBJECTIVE IMPAIRMENTS: Abnormal gait, decreased activity tolerance, decreased balance, decreased endurance, decreased mobility, difficulty walking, decreased ROM, decreased strength, increased edema, increased fascial restrictions, impaired flexibility, improper body mechanics, postural dysfunction, and pain.   ACTIVITY LIMITATIONS: standing, squatting, stairs, transfers, locomotion level, and caring for others  PARTICIPATION LIMITATIONS: cleaning, laundry, shopping, community activity, and yard work  PERSONAL FACTORS: Age, Fitness, Past/current experiences, and Time since onset of injury/illness/exacerbation are also affecting patient's functional outcome.   REHAB POTENTIAL: Good  CLINICAL DECISION MAKING: Stable/uncomplicated  EVALUATION COMPLEXITY: Low   GOALS: Goals reviewed with patient? Yes  SHORT TERM GOALS: Target date: 05/15/2023  Pt will be ind with initial HEP Baseline: Goal status: INITIAL  2.  Pt will demo L = R knee ROM Baseline:  Goal status: INITIAL   LONG TERM GOALS: Target date: 06/12/2023  Pt will be ind with management and progression of HEP Baseline:  Goal status: INITIAL  2.  Pt will be able to  ascend/descend 10 stairs with reciprocal pattern Baseline:  Goal status: INITIAL  3.  Pt will be able to perform SLS on R and L LE for at least 10 sec to demo improving LE stability and balance Baseline:  Goal status: INITIAL  4.  Pt will report decrease in knee pain to </=3/10 to be closer to her prior status Baseline:  Goal status: INITIAL  5.  Pt will have improved FOTO score to >/=64 Baseline:  Goal status: INITIAL   PLAN:  PT FREQUENCY: 2x/week  PT DURATION: 8 weeks  PLANNED INTERVENTIONS: Therapeutic exercises, Therapeutic activity, Neuromuscular re-education, Balance training, Gait training, Patient/Family education, Self Care, Joint mobilization, Stair training, Aquatic Therapy, Dry Needling, Electrical stimulation, Cryotherapy, Moist heat, Taping, Vasopneumatic device, Ionotophoresis 4mg /ml Dexamethasone, Manual therapy, and Re-evaluation  PLAN FOR NEXT SESSION: Assess response to updated HEP. Work on gross LE strengthening. Manual work for [employer], patellar mobilizations   Sanjuana Mae, PTA 04/21/2023, 3:33 PM

## 2023-04-24 ENCOUNTER — Ambulatory Visit: Payer: BC Managed Care – PPO

## 2023-04-24 DIAGNOSIS — M6281 Muscle weakness (generalized): Secondary | ICD-10-CM

## 2023-04-24 DIAGNOSIS — M25561 Pain in right knee: Secondary | ICD-10-CM | POA: Diagnosis not present

## 2023-04-24 DIAGNOSIS — R2689 Other abnormalities of gait and mobility: Secondary | ICD-10-CM

## 2023-04-24 DIAGNOSIS — M1711 Unilateral primary osteoarthritis, right knee: Secondary | ICD-10-CM | POA: Diagnosis not present

## 2023-04-24 DIAGNOSIS — M25661 Stiffness of right knee, not elsewhere classified: Secondary | ICD-10-CM | POA: Diagnosis not present

## 2023-04-24 NOTE — Therapy (Signed)
OUTPATIENT PHYSICAL THERAPY LOWER EXTREMITY TREATMENT   Patient Name: Brittany Byrd MRN: [id number] DOB:11-Dec-1970, 52 y.o., female Today's Date: 04/24/2023  END OF SESSION:  PT End of Session - 04/24/23 1400     Visit Number 3    Number of Visits 16    Date for PT Re-Evaluation 06/12/23    Authorization Type BC[employer]    PT Start Time 1400    PT Stop Time 1440    PT Time Calculation (min) 40 min    Activity Tolerance Patient tolerated treatment well    Behavior During Therapy WFL for tasks assessed/performed             Past Medical History:  Diagnosis Date   Hypertension    Migraine    Seasonal allergies    Uterine cancer (HCC)    Past Surgical History:  Procedure Laterality Date   DILATION AND CURETTAGE OF UTERUS     NASAL SEPTUM SURGERY     Patient Active Problem List   Diagnosis Date Noted   Class 3 severe obesity due to excess calories without serious comorbidity with body mass index (BMI) of 40.0 to 44.9 in adult (HCC) 02/25/2023   Primary osteoarthritis of right knee 02/25/2023   Panic attacks 12/13/2022   GAD (generalized anxiety disorder) 12/13/2022   Hyperlipidemia 10/30/2021   Elevated fasting glucose 10/30/2021   Recurrent major depressive disorder (HCC) 12/15/2020   Elevated LDL cholesterol level 10/05/2019   Left flank pain 10/05/2019   Left lower quadrant guarding 10/05/2019   Ketonuria 10/04/2019   Isolated proteinuria with morphologic lesion 10/04/2019   Bilirubinuria 10/04/2019   Uterine fibroid 01/05/2019   Ovarian cyst 01/05/2019   BMI 35.0-35.9,adult 04/20/2017   Heart murmur 04/20/2017   Constipation 04/18/2017   PMDD (premenstrual dysphoric disorder) 01/21/2017   Neuropathy 08/28/2016   Tinnitus of right ear 08/28/2016   Class 2 obesity due to excess calories without serious comorbidity with body mass index (BMI) of 39.0 to 39.9 in adult 03/11/2015   Epicondylitis elbow, medial 11/04/2014   Patellofemoral syndrome 11/04/2014   Asthma,  allergic 11/18/2012   Hypertension    Seasonal allergies    Migraine     PCP: Tandy Gaw, PA  REFERRING PROVIDER: Monica Becton, MD  REFERRING DIAG: M17.11 (ICD-10-CM) - Primary osteoarthritis of right knee  THERAPY DIAG:  Acute pain of right knee  Stiffness of right knee, not elsewhere classified  Muscle weakness (generalized)  Other abnormalities of gait and mobility  Rationale for Evaluation and Treatment: Rehabilitation  ONSET DATE: 01/15/23 fall on R knee  SUBJECTIVE:   SUBJECTIVE STATEMENT: Patient reports 6/10 pain and stiffness in knee today.  PERTINENT HISTORY: Chronic R knee pain  PAIN:  Are you having pain? Yes: NPRS scale: Prior to the fall her pain was a constant 2/10, now it's a steady 5/10 at times up to a 06/10/09 Pain location: front of R knee Pain description: "like it's on fire" Aggravating factors: stairs Relieving factors: Ice, pain medication  PRECAUTIONS: None  WEIGHT BEARING RESTRICTIONS: No  FALLS:  Has patient fallen in last 6 months? Yes. 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, television/film/video (mostly at a desk)  PLOF: Independent  PATIENT GOALS: Improve pain  NEXT MD VISIT: n/a  OBJECTIVE:  DIAGNOSTIC FINDINGS: MRI on 04/13/23 but not yet read  PATIENT SURVEYS:  FOTO 49; predicted 64  COGNITION: Overall cognitive status: Within functional limits for tasks assessed     SENSATION: WFL  EDEMA:  Nothing currently  MUSCLE LENGTH: Hamstrings: Right 60 deg; Left 80 deg Thomas test: did not assess  POSTURE: weight shift left and R knee slightly flexed  PALPATION: TTP R glute med and quads Hypomobile patella R vs L  LOWER EXTREMITY ROM:  Active ROM Right eval Left eval  Hip flexion     Hip extension    Hip abduction    Hip adduction    Hip internal rotation    Hip external rotation    Knee flexion 115 120  Knee extension -10 0  Ankle dorsiflexion    Ankle plantarflexion    Ankle inversion    Ankle eversion     (Blank rows = not tested)  LOWER EXTREMITY MMT:  MMT Right eval Left eval  Hip flexion 3+ 5  Hip extension 3 3+  Hip abduction 3+ 3+  Hip adduction    Hip internal rotation    Hip external rotation    Knee flexion 3+ 4  Knee extension 3+ 4-  Ankle dorsiflexion    Ankle plantarflexion    Ankle inversion    Ankle eversion     (Blank rows = not tested)  LOWER EXTREMITY SPECIAL TESTS:  Did not test  FUNCTIONAL TESTS:  5 times sit to stand: 11.11 sec SLS: L LE 5 sec, R LE 3 sec Stairs: ascends with step to pattern L LE leading; descends with step to pattern L LE leading but goes down side ways   GAIT: Distance walked: 100' Assistive device utilized: None Level of assistance: Complete Independence Comments: compensatory trunk lean on R with stance phase   TODAY'S TREATMENT:                                                                                                                              [facility] Adult PT Treatment:                                                DATE: 04/24/2023 Therapeutic Exercise: NuStep L3 x 5 min Mat Table: Quad set 8x5" SAQ green bolster 10x5" Hooklying hip add ball squeeze 10x5" --> Bridges + ball squeeze x10 Hooklying gait belt press 10x5" --> Bridges + gait belt press x10 SLR small range: parallel, ER, IR 10x5" each S/L clamshells GTB 3x10 S/L hip abd in ext leg raises 2x10 (foot propped on block) STS L foot propped on block    [facility] Adult PT Treatment:  DATE: 04/21/2023 Therapeutic Exercise: Mat Table: Quad set (towel roll) 10x5" SAQ green bolster 10x5" SLR small range: parallel, ER, IR 10x5" each Supine R HS/ITB stretches w/strap Hooklying hip add  ball squeeze --> bridges + ball squeeze 2x10 S/L clamshells RTB 2x10    PATIENT EDUCATION:  Education details: Updated HEP Person educated: Patient Education method: Explanation, Demonstration, and Handouts Education comprehension: verbalized understanding, returned demonstration, and needs further education  HOME EXERCISE PROGRAM: Access Code: [email or web address] URL: [email or web address] Date: 04/21/2023 Prepared by: Carlynn Herald  Exercises - Supine Quad Set  - 1 x daily - 7 x weekly - 3 sets - 10 reps - Supine Knee Extension Strengthening  - 1 x daily - 7 x weekly - 3 sets - 10 reps - Small Range Straight Leg Raise  - 1 x daily - 7 x weekly - 3 sets - 10 reps - Supine Bridge  - 1 x daily - 7 x weekly - 2 sets - 10 reps - Clam with Resistance  - 1 x daily - 7 x weekly - 3 sets - 10 reps  ASSESSMENT:  CLINICAL IMPRESSION: Noted weakness with straight leg raises in external hip rotation. Hip strengthening progressed with hip abduction variations. Decreased weight bearing on R LE during transition from stand to sit exercise.    OBJECTIVE IMPAIRMENTS: Abnormal gait, decreased activity tolerance, decreased balance, decreased endurance, decreased mobility, difficulty walking, decreased ROM, decreased strength, increased edema, increased fascial restrictions, impaired flexibility, improper body mechanics, postural dysfunction, and pain.   ACTIVITY LIMITATIONS: standing, squatting, stairs, transfers, locomotion level, and caring for others  PARTICIPATION LIMITATIONS: cleaning, laundry, shopping, community activity, and yard work  PERSONAL FACTORS: Age, Fitness, Past/current experiences, and Time since onset of injury/illness/exacerbation are also affecting patient's functional outcome.   REHAB POTENTIAL: Good  CLINICAL DECISION MAKING: Stable/uncomplicated  EVALUATION COMPLEXITY: Low   GOALS: Goals reviewed with patient? Yes  SHORT TERM GOALS: Target date:  05/15/2023  Pt will be ind with initial HEP Baseline: Goal status: INITIAL  2.  Pt will demo L = R knee ROM Baseline:  Goal status: INITIAL   LONG TERM GOALS: Target date: 06/12/2023  Pt will be ind with management and progression of HEP Baseline:  Goal status: INITIAL  2.  Pt will be able to ascend/descend 10 stairs with reciprocal pattern Baseline:  Goal status: INITIAL  3.  Pt will be able to perform SLS on R and L LE for at least 10 sec to demo improving LE stability and balance Baseline:  Goal status: INITIAL  4.  Pt will report decrease in knee pain to </=3/10 to be closer to her prior status Baseline:  Goal status: INITIAL  5.  Pt will have improved FOTO score to >/=64 Baseline:  Goal status: INITIAL   PLAN:  PT FREQUENCY: 2x/week  PT DURATION: 8 weeks  PLANNED INTERVENTIONS: Therapeutic exercises, Therapeutic activity, Neuromuscular re-education, Balance training, Gait training, Patient/Family education, Self Care, Joint mobilization, Stair training, Aquatic Therapy, Dry Needling, Electrical stimulation, Cryotherapy, Moist heat, Taping, Vasopneumatic device, Ionotophoresis 4mg /ml Dexamethasone, Manual therapy, and Re-evaluation  PLAN FOR NEXT SESSION: Work on gross LE strengthening. 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 FOTO 5th visit   Sanjuana Mae, PTA 04/24/2023, 2:41 PM

## 2023-04-25 ENCOUNTER — Ambulatory Visit: Payer: BC Managed Care – PPO | Admitting: Physician Assistant

## 2023-04-30 ENCOUNTER — Encounter: Payer: Self-pay | Admitting: Physical Therapy

## 2023-04-30 ENCOUNTER — Ambulatory Visit: Payer: BC Managed Care – PPO | Admitting: Physical Therapy

## 2023-04-30 DIAGNOSIS — M25661 Stiffness of right knee, not elsewhere classified: Secondary | ICD-10-CM

## 2023-04-30 DIAGNOSIS — M1711 Unilateral primary osteoarthritis, right knee: Secondary | ICD-10-CM | POA: Diagnosis not present

## 2023-04-30 DIAGNOSIS — R2689 Other abnormalities of gait and mobility: Secondary | ICD-10-CM | POA: Diagnosis not present

## 2023-04-30 DIAGNOSIS — M6281 Muscle weakness (generalized): Secondary | ICD-10-CM

## 2023-04-30 DIAGNOSIS — M25561 Pain in right knee: Secondary | ICD-10-CM

## 2023-04-30 NOTE — Therapy (Signed)
OUTPATIENT PHYSICAL THERAPY LOWER EXTREMITY TREATMENT   Patient Name: Brittany Byrd MRN: [id number] DOB:06-04-1971, 52 y.o., female Today's Date: 04/30/2023  END OF SESSION:  PT End of Session - 04/30/23 0759     Visit Number 4    Number of Visits 16    Date for PT Re-Evaluation 06/12/23    Authorization Type BCBS    PT Start Time 0800    PT Stop Time 0840    PT Time Calculation (min) 40 min    Activity Tolerance Patient tolerated treatment well    Behavior During Therapy WFL for tasks assessed/performed             Past Medical History:  Diagnosis Date   Hypertension    Migraine    Seasonal allergies    Uterine cancer (HCC)    Past Surgical History:  Procedure Laterality Date   DILATION AND CURETTAGE OF UTERUS     NASAL SEPTUM SURGERY     Patient Active Problem List   Diagnosis Date Noted   Class 3 severe obesity due to excess calories without serious comorbidity with body mass index (BMI) of 40.0 to 44.9 in adult (HCC) 02/25/2023   Primary osteoarthritis of right knee 02/25/2023   Panic attacks 12/13/2022   GAD (generalized anxiety disorder) 12/13/2022   Hyperlipidemia 10/30/2021   Elevated fasting glucose 10/30/2021   Recurrent major depressive disorder (HCC) 12/15/2020   Elevated LDL cholesterol level 10/05/2019   Left flank pain 10/05/2019   Left lower quadrant guarding 10/05/2019   Ketonuria 10/04/2019   Isolated proteinuria with morphologic lesion 10/04/2019   Bilirubinuria 10/04/2019   Uterine fibroid 01/05/2019   Ovarian cyst 01/05/2019   BMI 35.0-35.9,adult 04/20/2017   Heart murmur 04/20/2017   Constipation 04/18/2017   PMDD (premenstrual dysphoric disorder) 01/21/2017   Neuropathy 08/28/2016   Tinnitus of right ear 08/28/2016   Class 2 obesity due to excess calories without serious comorbidity with body mass index (BMI) of 39.0 to 39.9 in adult 03/11/2015   Epicondylitis elbow, medial 11/04/2014   Patellofemoral syndrome 11/04/2014   Asthma,  allergic 11/18/2012   Hypertension    Seasonal allergies    Migraine     PCP: Tandy Gaw, PA  REFERRING PROVIDER: Monica Becton, MD  REFERRING DIAG: M17.11 (ICD-10-CM) - Primary osteoarthritis of right knee  THERAPY DIAG:  Acute pain of right knee  Stiffness of right knee, not elsewhere classified  Muscle weakness (generalized)  Other abnormalities of gait and mobility  Rationale for Evaluation and Treatment: Rehabilitation  ONSET DATE: 01/15/23 fall on R knee  SUBJECTIVE:   SUBJECTIVE STATEMENT: Pt states knee is a little tight today. Pt reports she sat all day which isn't. The weekend she walked a lot. Pt states she will be getting her visco injection tomorrow.   PERTINENT HISTORY: Chronic R knee pain  PAIN:  Are you having pain? Yes: NPRS scale: currently 6, Prior to the fall her pain was a constant 2/10, now it's a steady 5/10 at times up to a 06/10/09 Pain location: front of R knee Pain description: "like it's on fire" Aggravating factors: stairs Relieving factors: Ice, pain medication  PRECAUTIONS: None  WEIGHT BEARING RESTRICTIONS: No  FALLS:  Has patient fallen in last 6 months? Yes. 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, television/film/video (mostly at a desk)  PLOF: Independent  PATIENT GOALS: Improve pain  NEXT MD VISIT: n/a  OBJECTIVE:   DIAGNOSTIC FINDINGS: MRI on 04/13/23 but not yet read  PATIENT SURVEYS:  FOTO 49; predicted 64  MUSCLE LENGTH: Hamstrings: Right 60 deg; Left 80 deg Thomas test: did not assess  POSTURE: weight shift left and R knee slightly flexed  PALPATION: TTP R glute med and quads Hypomobile patella R vs L  LOWER EXTREMITY ROM:  Active ROM Right eval Left eval  Hip flexion    Hip  extension    Hip abduction    Hip adduction    Hip internal rotation    Hip external rotation    Knee flexion 115 120  Knee extension -10 0  Ankle dorsiflexion    Ankle plantarflexion    Ankle inversion    Ankle eversion     (Blank rows = not tested)  LOWER EXTREMITY MMT:  MMT Right eval Left eval  Hip flexion 3+ 5  Hip extension 3 3+  Hip abduction 3+ 3+  Hip adduction    Hip internal rotation    Hip external rotation    Knee flexion 3+ 4  Knee extension 3+ 4-  Ankle dorsiflexion    Ankle plantarflexion    Ankle inversion    Ankle eversion     (Blank rows = not tested)  LOWER EXTREMITY SPECIAL TESTS:  Did not test  FUNCTIONAL TESTS:  5 times sit to stand: 11.11 sec SLS: L LE 5 sec, R LE 3 sec Stairs: ascends with step to pattern L LE leading; descends with step to pattern L LE leading but goes down side ways   GAIT: Distance walked: 100' Assistive device utilized: None Level of assistance: Complete Independence Comments: compensatory trunk lean on R with stance phase   TODAY'S TREATMENT:             [facility] Adult PT Treatment:                                                DATE: 04/30/23 Therapeutic Exercise: Nustep L5 x 5 min Sitting Hamstring stretch x 30 sec Hip flexor stretch x 30 sec Marching red TB x10, green TB x10 Step outs with green TB 2x10 LAQ green TB 2x10 SLR 2x10 toe up, SLR + ER 2x10 Standing Hip ext 2x10 Hip abd 2x10 Hamstring curl 2x10                                                                                                                    [facility] Adult PT Treatment:  DATE: 04/24/2023 Therapeutic Exercise: NuStep L3 x 5 min Mat Table: Quad set 8x5" SAQ green bolster 10x5" Hooklying hip add ball squeeze 10x5" --> Bridges + ball squeeze x10 Hooklying gait belt press 10x5" --> Bridges + gait belt press x10 SLR small range: parallel, ER, IR 10x5" each S/L clamshells GTB 3x10 S/L hip  abd in ext leg raises 2x10 (foot propped on block) STS L foot propped on block    [facility] Adult PT Treatment:                                                DATE: 04/21/2023 Therapeutic Exercise: Mat Table: Quad set (towel roll) 10x5" SAQ green bolster 10x5" SLR small range: parallel, ER, IR 10x5" each Supine R HS/ITB stretches w/strap Hooklying hip add ball squeeze --> bridges + ball squeeze 2x10 S/L clamshells RTB 2x10    PATIENT EDUCATION:  Education details: Updated HEP Person educated: Patient Education method: Explanation, Demonstration, and Handouts Education comprehension: verbalized understanding, returned demonstration, and needs further education  HOME EXERCISE PROGRAM: Access Code: [email or web address] URL: [email or web address] Date: 04/21/2023 Prepared by: Carlynn Herald  Exercises - Supine Quad Set  - 1 x daily - 7 x weekly - 3 sets - 10 reps - Supine Knee Extension Strengthening  - 1 x daily - 7 x weekly - 3 sets - 10 reps - Small Range Straight Leg Raise  - 1 x daily - 7 x weekly - 3 sets - 10 reps - Supine Bridge  - 1 x daily - 7 x weekly - 2 sets - 10 reps - Clam with Resistance  - 1 x daily - 7 x weekly - 3 sets - 10 reps  ASSESSMENT:  CLINICAL IMPRESSION: Provided pt exercises she can perform seated while at work. Continued focus on gross R LE strengthening. Difficulty tolerating full weight on R LE for prolonged time in standing. Improving quad strength noted with SLR.   OBJECTIVE IMPAIRMENTS: Abnormal gait, decreased activity tolerance, decreased balance, decreased endurance, decreased mobility, difficulty walking, decreased ROM, decreased strength, increased edema, increased fascial restrictions, impaired flexibility, improper body mechanics, postural dysfunction, and pain.    GOALS: Goals reviewed with patient? Yes  SHORT TERM GOALS: Target date: 05/15/2023  Pt will be ind with initial HEP Baseline: Goal status: INITIAL  2.  Pt will demo L = R  knee ROM Baseline:  Goal status: INITIAL   LONG TERM GOALS: Target date: 06/12/2023  Pt will be ind with management and progression of HEP Baseline:  Goal status: INITIAL  2.  Pt will be able to ascend/descend 10 stairs with reciprocal pattern Baseline:  Goal status: INITIAL  3.  Pt will be able to perform SLS on R and L LE for at least 10 sec to demo improving LE stability and balance Baseline:  Goal status: INITIAL  4.  Pt will report decrease in knee pain to </=3/10 to be closer to her prior status Baseline:  Goal status: INITIAL  5.  Pt will have improved FOTO score to >/=64 Baseline:  Goal status: INITIAL   PLAN:  PT FREQUENCY: 2x/week  PT DURATION: 8 weeks  PLANNED INTERVENTIONS: Therapeutic exercises, Therapeutic activity, Neuromuscular re-education, Balance training, Gait training, Patient/Family education, Self Care, Joint mobilization, Stair training, Aquatic Therapy, Dry Needling, Electrical stimulation, Cryotherapy, Moist heat, Taping, Vasopneumatic device, Ionotophoresis 4mg /ml Dexamethasone, Manual therapy, and Re-evaluation  PLAN FOR NEXT SESSION: Work on gross LE strengthening. 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 FOTO 6th visit   Criselda Starke April Ma L Akeema Broder, PT 04/30/2023, 8:00 AM

## 2023-05-01 ENCOUNTER — Ambulatory Visit (INDEPENDENT_AMBULATORY_CARE_PROVIDER_SITE_OTHER): Payer: BC Managed Care – PPO | Admitting: Sports Medicine

## 2023-05-01 DIAGNOSIS — M1711 Unilateral primary osteoarthritis, right knee: Secondary | ICD-10-CM

## 2023-05-01 NOTE — Progress Notes (Signed)
    Procedures performed today:    None.  Independent interpretation of notes and tests performed by another provider:   None.  Brief History, Exam, Impression, and Recommendations:    Primary osteoarthritis of right knee This pleasant 52 year old female returns, she has x-ray confirmed osteoarthritis, she did really well initially with a steroid injection, had partial relief but does have some recurrence of pain, ultimately we obtained an MRI that showed severe medial compartment knee osteoarthritis with medial meniscal extrusion, effusion, synovitis. I do not think this is addressable through arthroscopic debridement, we are getting her approved for Orthovisc and once approved I would like to see her back to start the series, she does have fantastic relief with diclofenac, so she can take this twice a day in the meantime.    ____________________________________________ Ihor Austin. Benjamin Stain, M.D., ABFM., CAQSM., AME. Primary Care and Sports Medicine [facility]  Adjunct Professor of Family Medicine  [employer]  Restaurant manager, fast food

## 2023-05-01 NOTE — Assessment & Plan Note (Signed)
This pleasant 52 year old female returns, she has x-ray confirmed osteoarthritis, she did really well initially with a steroid injection, had partial relief but does have some recurrence of pain, ultimately we obtained an MRI that showed severe medial compartment knee osteoarthritis with medial meniscal extrusion, effusion, synovitis. I do not think this is addressable through arthroscopic debridement, we are getting her approved for Orthovisc and once approved I would like to see her back to start the series, she does have fantastic relief with diclofenac, so she can take this twice a day in the meantime.

## 2023-05-03 DIAGNOSIS — R7309 Other abnormal glucose: Secondary | ICD-10-CM | POA: Diagnosis not present

## 2023-05-06 ENCOUNTER — Encounter: Payer: Self-pay | Admitting: Physician Assistant

## 2023-05-06 ENCOUNTER — Ambulatory Visit (INDEPENDENT_AMBULATORY_CARE_PROVIDER_SITE_OTHER): Payer: BC Managed Care – PPO | Admitting: Physician Assistant

## 2023-05-06 ENCOUNTER — Ambulatory Visit: Payer: BC Managed Care – PPO | Attending: Sports Medicine

## 2023-05-06 VITALS — BP 156/76 | HR 116 | Ht 61.0 in | Wt 202.0 lb

## 2023-05-06 DIAGNOSIS — R2689 Other abnormalities of gait and mobility: Secondary | ICD-10-CM | POA: Diagnosis present

## 2023-05-06 DIAGNOSIS — M25661 Stiffness of right knee, not elsewhere classified: Secondary | ICD-10-CM | POA: Diagnosis present

## 2023-05-06 DIAGNOSIS — M6281 Muscle weakness (generalized): Secondary | ICD-10-CM | POA: Insufficient documentation

## 2023-05-06 DIAGNOSIS — M25561 Pain in right knee: Secondary | ICD-10-CM | POA: Diagnosis not present

## 2023-05-06 DIAGNOSIS — I10 Essential (primary) hypertension: Secondary | ICD-10-CM

## 2023-05-06 DIAGNOSIS — F339 Major depressive disorder, recurrent, unspecified: Secondary | ICD-10-CM

## 2023-05-06 DIAGNOSIS — F410 Panic disorder [episodic paroxysmal anxiety] without agoraphobia: Secondary | ICD-10-CM | POA: Diagnosis not present

## 2023-05-06 DIAGNOSIS — F411 Generalized anxiety disorder: Secondary | ICD-10-CM

## 2023-05-06 DIAGNOSIS — Z6838 Body mass index (BMI) 38.0-38.9, adult: Secondary | ICD-10-CM

## 2023-05-06 DIAGNOSIS — E6609 Other obesity due to excess calories: Secondary | ICD-10-CM

## 2023-05-06 MED ORDER — HYDROCHLOROTHIAZIDE 12.5 MG PO TABS
12.5000 mg | ORAL_TABLET | Freq: Every day | ORAL | 1 refills | Status: DC
Start: 2023-05-06 — End: 2023-08-07

## 2023-05-06 NOTE — Therapy (Addendum)
 OUTPATIENT PHYSICAL THERAPY LOWER EXTREMITY TREATMENT AND DISCHARGE  PHYSICAL THERAPY DISCHARGE SUMMARY  Visits from Start of Care: 5  Current functional level related to goals / functional outcomes: See below. Improving stair negotiation   Remaining deficits: See below   Education / Equipment: HEP   Patient agrees to discharge. Patient goals were not met. Patient is being discharged due to not returning since the last visit.   Patient Name: Brittany Byrd MRN: [id number] DOB:1971-10-24, 52 y.o., female Today's Date: 05/06/2023  END OF SESSION:  PT End of Session - 05/06/23 0842     Visit Number 5    Number of Visits 16    Date for PT Re-Evaluation 06/12/23    Authorization Type [employer]    PT Start Time [phone]    PT Stop Time 0928    PT Time Calculation (min) 45 min    Activity Tolerance Patient tolerated treatment well    Behavior During Therapy WFL for tasks assessed/performed             Past Medical History:  Diagnosis Date   Hypertension    Migraine    Seasonal allergies    Uterine cancer (HCC)    Past Surgical History:  Procedure Laterality Date   DILATION AND CURETTAGE OF UTERUS     NASAL SEPTUM SURGERY     Patient Active Problem List   Diagnosis Date Noted   Class 3 severe obesity due to excess calories without serious comorbidity with body mass index (BMI) of 40.0 to 44.9 in adult (HCC) 02/25/2023   Primary osteoarthritis of right knee 02/25/2023   Panic attacks 12/13/2022   GAD (generalized anxiety disorder) 12/13/2022   Hyperlipidemia 10/30/2021   Elevated fasting glucose 10/30/2021   Recurrent major depressive disorder (HCC) 12/15/2020   Elevated LDL cholesterol level 10/05/2019   Left flank pain 10/05/2019   Left lower quadrant guarding 10/05/2019   Ketonuria 10/04/2019   Isolated proteinuria with morphologic lesion 10/04/2019   Bilirubinuria 10/04/2019   Uterine fibroid 01/05/2019   Ovarian cyst 01/05/2019   BMI 35.0-35.9,adult 04/20/2017    Heart murmur 04/20/2017   Constipation 04/18/2017   PMDD (premenstrual dysphoric disorder) 01/21/2017   Neuropathy 08/28/2016   Tinnitus of right ear 08/28/2016   Class 2 obesity due to excess calories without serious comorbidity with body mass index (BMI) of 39.0 to 39.9 in adult 03/11/2015   Epicondylitis elbow, medial 11/04/2014   Patellofemoral syndrome 11/04/2014   Asthma, allergic 11/18/2012   Hypertension    Seasonal allergies    Migraine     PCP: Tandy Gaw, PA  REFERRING PROVIDER: Monica Becton, MD  REFERRING DIAG: M17.11 (ICD-10-CM) - Primary osteoarthritis of right knee  THERAPY DIAG:  Acute pain of right knee  Stiffness of right knee, not elsewhere classified  Muscle weakness (generalized)  Other abnormalities of gait and mobility  Rationale for Evaluation and Treatment: Rehabilitation  ONSET DATE: 01/15/23 fall on R knee  SUBJECTIVE:   SUBJECTIVE STATEMENT: Patient reports 4/10 pain in knee today; states she is having an easier time navigating stairs.   PERTINENT HISTORY: Chronic R knee pain  PAIN:  Are you having pain? Yes: NPRS scale: currently 6, Prior to the fall her pain was a constant 2/10, now it's a steady 5/10 at times up to a 06/10/09 Pain location: front of R knee Pain description: "like it's on fire" Aggravating factors: stairs Relieving factors: Ice, pain medication  PRECAUTIONS: None  WEIGHT BEARING RESTRICTIONS: No  FALLS:  Has  patient fallen in last 6 months? Yes. 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, television/film/video (mostly at a desk)  PLOF: Independent  PATIENT GOALS: Improve pain  NEXT MD VISIT: n/a  OBJECTIVE:   DIAGNOSTIC FINDINGS: MRI on 04/13/23 but not yet read  PATIENT SURVEYS:   FOTO 49; predicted 64  MUSCLE LENGTH: Hamstrings: Right 60 deg; Left 80 deg Thomas test: did not assess  POSTURE: weight shift left and R knee slightly flexed  PALPATION: TTP R glute med and quads Hypomobile patella R vs L  LOWER EXTREMITY ROM:  Active ROM Right eval Left eval  Hip flexion    Hip extension    Hip abduction    Hip adduction    Hip internal rotation    Hip external rotation    Knee flexion 115 120  Knee extension -10 0  Ankle dorsiflexion    Ankle plantarflexion    Ankle inversion    Ankle eversion     (Blank rows = not tested)  LOWER EXTREMITY MMT:  MMT Right eval Left eval  Hip flexion 3+ 5  Hip extension 3 3+  Hip abduction 3+ 3+  Hip adduction    Hip internal rotation    Hip external rotation    Knee flexion 3+ 4  Knee extension 3+ 4-  Ankle dorsiflexion    Ankle plantarflexion    Ankle inversion    Ankle eversion     (Blank rows = not tested)  LOWER EXTREMITY SPECIAL TESTS:  Did not test  FUNCTIONAL TESTS:  5 times sit to stand: 11.11 sec SLS: L LE 5 sec, R LE 3 sec Stairs: ascends with step to pattern L LE leading; descends with step to pattern L LE leading but goes down side ways   GAIT: Distance walked: 100' Assistive device utilized: None Level of assistance: Complete Independence Comments: compensatory trunk lean on R with stance phase   TODAY'S TREATMENT:             [facility] Adult PT Treatment:                                                DATE: 05/06/2023 Therapeutic Exercise: NuStep L5 x 5 min Seated: HS stretch 3x20" (foot propped on stool) Hip flexor stretch x 30" SLR x 6 --> side to side raises over yoga egg 2x10 LAQ x10 GTB Supine: SAQ 4#AW (green bolster) 10x5" SLR in ER 10x3" Standing (counter): Resisted side stepping GTB crossed at ankles  Hip extension YTB x10 B Hamstring curls x10 B Dynamic HS stretch Side heel tap downs x10 R SL leg press: 40# x10   [facility] Adult PT Treatment:                                                 DATE: 04/30/23 Therapeutic Exercise: Nustep L5 x 5 min Sitting Hamstring stretch x 30 sec Hip flexor stretch x 30 sec Marching red TB x10, green TB x10 Step outs with green TB 2x10 LAQ  green TB 2x10 SLR 2x10 toe up, SLR + ER 2x10 Standing Hip ext 2x10 Hip abd 2x10 Hamstring curl 2x10                                                                                                                      [facility] Adult PT Treatment:                                                DATE: 04/24/2023 Therapeutic Exercise: NuStep L3 x 5 min Mat Table: Quad set 8x5" SAQ green bolster 10x5" Hooklying hip add ball squeeze 10x5" --> Bridges + ball squeeze x10 Hooklying gait belt press 10x5" --> Bridges + gait belt press x10 SLR small range: parallel, ER, IR 10x5" each S/L clamshells GTB 3x10 S/L hip abd in ext leg raises 2x10 (foot propped on block) STS L foot propped on block    PATIENT EDUCATION:  Education details: Updated HEP Person educated: Patient Education method: Explanation, Demonstration, and Handouts Education comprehension: verbalized understanding, returned demonstration, and needs further education  HOME EXERCISE PROGRAM: Access Code: [email or web address] URL: [email or web address] Date: 05/06/2023 Prepared by: Carlynn Herald  Exercises - Supine Quad Set  - 1 x daily - 7 x weekly - 3 sets - 10 reps - Supine Knee Extension Strengthening  - 1 x daily - 7 x weekly - 3 sets - 10 reps - Small Range Straight Leg Raise  - 1 x daily - 7 x weekly - 3 sets - 10 reps - Supine Bridge  - 1 x daily - 7 x weekly - 2 sets - 10 reps - Clam with Resistance  - 1 x daily - 7 x weekly - 3 sets - 10 reps - Standing Hip Extension with Counter Support  - 1 x daily - 7 x weekly - 2 sets - 10 reps - Seated Knee Extension with Resistance  - 1 x daily - 7 x weekly - 2 sets - 10 reps - Seated Small Alternating Straight Leg Lifts with Heel Touch  - 1 x daily - 7 x weekly - 2  sets - 10 reps - Seated Hip Abduction with Resistance  - 1 x daily - 7 x weekly - 2 sets - 10 reps - Seated Hamstring Stretch  - 1 x daily - 7 x weekly - 2 sets - 30 sec hold - Standing Knee Flexion AROM with Chair Support  - 1 x daily - 7 x weekly - 3 sets - 10 reps  ASSESSMENT:  CLINICAL IMPRESSION: Cueing improved body mechanics decreased anterior knee pain during lateral step downs. Quad strengthening progressed with single leg press.    OBJECTIVE IMPAIRMENTS: Abnormal gait, decreased activity tolerance, decreased balance, decreased endurance, decreased mobility, difficulty walking, decreased ROM, decreased strength, increased edema, increased fascial restrictions, impaired flexibility, improper body mechanics, postural dysfunction, and pain.  GOALS: Goals reviewed with patient? Yes  SHORT TERM GOALS: Target date: 05/15/2023  Pt will be ind with initial HEP Baseline: Goal status: INITIAL  2.  Pt will demo L = R knee ROM Baseline:  Goal status: INITIAL   LONG TERM GOALS: Target date: 06/12/2023  Pt will be ind with management and progression of HEP Baseline:  Goal status: INITIAL  2.  Pt will be able to ascend/descend 10 stairs with reciprocal pattern Baseline:  Goal status: INITIAL  3.  Pt will be able to perform SLS on R and L LE for at least 10 sec to demo improving LE stability and balance Baseline:  Goal status: INITIAL  4.  Pt will report decrease in knee pain to </=3/10 to be closer to her prior status Baseline:  Goal status: INITIAL  5.  Pt will have improved FOTO score to >/=64 Baseline:  Goal status: INITIAL   PLAN:  PT FREQUENCY: 2x/week  PT DURATION: 8 weeks  PLANNED INTERVENTIONS: Therapeutic exercises, Therapeutic activity, Neuromuscular re-education, Balance training, Gait training, Patient/Family education, Self Care, Joint mobilization, Stair training, Aquatic Therapy, Dry Needling, Electrical stimulation, Cryotherapy, Moist heat, Taping,  Vasopneumatic device, Ionotophoresis 4mg /ml Dexamethasone, Manual therapy, and Re-evaluation  PLAN FOR NEXT SESSION: Work on gross LE strengthening. 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 FOTO 6th visit   Sanjuana Mae, PTA 05/06/2023, 9:28 AM

## 2023-05-06 NOTE — Patient Instructions (Addendum)
Increase zoloft to 100mg  daily.   Buspirone Tablets What is this medication? BUSPIRONE (byoo SPYE rone) treats anxiety. It works by balancing the levels of dopamine and serotonin in your brain, substances that help regulate mood. This medicine may be used for other purposes; ask your health care provider or pharmacist if you have questions. COMMON BRAND NAME(S): BuSpar, Buspar Dividose What should I tell my care team before I take this medication? They need to know if you have any of these conditions: Kidney or liver disease An unusual or allergic reaction to buspirone, other medications, foods, dyes, or preservatives Pregnant or trying to get pregnant Breast-feeding How should I use this medication? Take this medication by mouth with a glass of water. Follow the directions on the prescription label. You may take this medication with or without food. To ensure that this medication always works the same way for you, you should take it either always with or always without food. Take your doses at regular intervals. Do not take your medication more often than directed. Do not stop taking except on the advice of your care team. Talk to your care team about the use of this medication in children. Special care may be needed. Overdosage: If you think you have taken too much of this medicine contact a poison control center or emergency room at once. NOTE: This medicine is only for you. Do not share this medicine with others. What if I miss a dose? If you miss a dose, take it as soon as you can. If it is almost time for your next dose, take only that dose. Do not take double or extra doses. What may interact with this medication? Do not take this medication with any of the following: Linezolid MAOIs like Carbex, Eldepryl, Marplan, Nardil, and Parnate Methylene blue Procarbazine This medication may also interact with the following: Diazepam Digoxin Diltiazem Erythromycin Grapefruit  juice Haloperidol Medications for mental depression or mood problems Medications for seizures like carbamazepine, phenobarbital and phenytoin Nefazodone Other medications for anxiety Rifampin Ritonavir Some antifungal medications like itraconazole, ketoconazole, and voriconazole Verapamil Warfarin This list may not describe all possible interactions. Give your health care provider a list of all the medicines, herbs, non-prescription drugs, or dietary supplements you use. Also tell them if you smoke, drink alcohol, or use illegal drugs. Some items may interact with your medicine. What should I watch for while using this medication? Visit your care team for regular checks on your progress. It may take 1 to 2 weeks before your anxiety gets better. This medication may affect your coordination, reaction time, or judgment. Do not drive or operate machinery until you know how this medication affects you. Sit up or stand slowly to reduce the risk of dizzy or fainting spells. Drinking alcohol with this medication can increase the risk of these side effects. What side effects may I notice from receiving this medication? Side effects that you should report to your care team as soon as possible: Allergic reactions--skin rash, itching, hives, swelling of the face, lips, tongue, or throat Irritability, confusion, fast or irregular heartbeat, muscle stiffness, twitching muscles, sweating, high fever, seizure, chills, vomiting, diarrhea, which may be signs of serotonin syndrome Side effects that usually do not require medical attention (report to your care team if they continue or are bothersome): Anxiety, nervousness Dizziness Drowsiness Headache Nausea Trouble sleeping This list may not describe all possible side effects. Call your doctor for medical advice about side effects. You may report side effects to  FDA at 1-800-FDA-1088. Where should I keep my medication? Keep out of the reach of  children. Store at room temperature below 30 degrees C (86 degrees F). Protect from light. Keep container tightly closed. Throw away any unused medication after the expiration date. NOTE: This sheet is a summary. It may not cover all possible information. If you have questions about this medicine, talk to your doctor, pharmacist, or health care provider.  2024 Elsevier/Gold Standard (2022-06-10 00:00:00)

## 2023-05-06 NOTE — Progress Notes (Signed)
Established Patient Office Visit  Subjective   Patient ID: Brittany Byrd, female    DOB: 04-Jan-1971  Age: 52 y.o. MRN: [id number]  Chief Complaint  Patient presents with   Follow-up    HPI Pt is a 52 yo obese female who presents to the clinic to follow up on panic attacks, anxiety, Depression and HTN.   She admits she is not taking HCTZ daily with cozaar. Her BPs at home are in the 140s over 70s. No CP, palpitations, headaches or vision changes.   She is walking more and eating better. She has cut out sugar almost completely. She has lost 10lbs on home scale. She does feel better.   Depressed mood is much better but anxiety is the same. She admits to feeling "pending doom" for no reason. She feels like most of her stress is due to work situation and conflict with Production designer, theatre/television/film. No SI/HC. Taking zoloft daily 50mg . She is in monthly therapy.  .. Active Ambulatory Problems    Diagnosis Date Noted   Hypertension    Seasonal allergies    Migraine    Asthma, allergic 11/18/2012   Epicondylitis elbow, medial 11/04/2014   Patellofemoral syndrome 11/04/2014   Class 2 obesity due to excess calories without serious comorbidity with body mass index (BMI) of 39.0 to 39.9 in adult 03/11/2015   Neuropathy 08/28/2016   Tinnitus of right ear 08/28/2016   PMDD (premenstrual dysphoric disorder) 01/21/2017   Constipation 04/18/2017   BMI 35.0-35.9,adult 04/20/2017   Heart murmur 04/20/2017   Uterine fibroid 01/05/2019   Ovarian cyst 01/05/2019   Ketonuria 10/04/2019   Isolated proteinuria with morphologic lesion 10/04/2019   Bilirubinuria 10/04/2019   Elevated LDL cholesterol level 10/05/2019   Left flank pain 10/05/2019   Left lower quadrant guarding 10/05/2019   Recurrent major depressive disorder (HCC) 12/15/2020   Hyperlipidemia 10/30/2021   Elevated fasting glucose 10/30/2021   Panic attacks 12/13/2022   GAD (generalized anxiety disorder) 12/13/2022   Class 3 severe obesity due to excess  calories without serious comorbidity with body mass index (BMI) of 40.0 to 44.9 in adult [facility]) 02/25/2023   Primary osteoarthritis of right knee 02/25/2023   Resolved Ambulatory Problems    Diagnosis Date Noted   No Resolved Ambulatory Problems   Past Medical History:  Diagnosis Date   Uterine cancer (HCC)     ROS See HPI.    Objective:     BP (!) 156/76   Pulse (!) 116   Ht 5\' 1"  (1.549 m)   Wt 202 lb (91.6 kg)   SpO2 98%   BMI 38.17 kg/m  BP Readings from Last 3 Encounters:  05/06/23 (!) 156/76  03/25/23 (!) 160/90  02/25/23 (!) 159/97   Wt Readings from Last 3 Encounters:  05/06/23 202 lb (91.6 kg)  02/25/23 208 lb (94.3 kg)  12/13/22 206 lb (93.4 kg)      ..    05/06/2023    8:33 AM 12/13/2022    3:20 PM 12/15/2020   11:04 AM 10/04/2019   10:03 AM 02/25/2018    9:39 AM  Depression screen PHQ 2/9  Decreased Interest 1 3 2 1 2   Down, Depressed, Hopeless 1 3 2 1 2   PHQ - 2 Score 2 6 4 2 4   Altered sleeping 1 3 1 1 2   Tired, decreased energy 1 3 3 1 2   Change in appetite 0 3 3 1 2   Feeling bad or failure about yourself  2 3 3  1 2  Trouble concentrating 2 3 2 1 1   Moving slowly or fidgety/restless 1 1 1  0 1  Suicidal thoughts 0 0 0 0 2  PHQ-9 Score 9 22 17 7 16   Difficult doing work/chores Not difficult at all Very difficult Somewhat difficult Not difficult at all Very difficult   ..    05/06/2023    8:34 AM 12/13/2022    3:21 PM 12/15/2020   11:05 AM 10/04/2019   10:04 AM  GAD 7 : Generalized Anxiety Score  Nervous, Anxious, on Edge 3 3 3 1   Control/stop worrying 3 3 2 1   Worry too much - different things 3 3  1   Trouble relaxing 3 3 3 1   Restless 3 2 2  0  Easily annoyed or irritable 3 2 3 1   Afraid - awful might happen 3 3  1   Total GAD 7 Score 21 19  6   Anxiety Difficulty Not difficult at all Very difficult Somewhat difficult Somewhat difficult     Physical Exam Constitutional:      Appearance: Normal appearance. She is obese.  HENT:     Head:  Normocephalic.  Cardiovascular:     Rate and Rhythm: Normal rate.  Pulmonary:     Effort: Pulmonary effort is normal.  Neurological:     General: No focal deficit present.     Mental Status: She is alert and oriented to person, place, and time.  Psychiatric:        Mood and Affect: Mood normal.        Assessment & Plan:  [name] KitchenMarland KitchenDeanna was seen today for follow-up.  Diagnoses and all orders for this visit:  GAD (generalized anxiety disorder)  Panic attacks  Primary hypertension -     hydrochlorothiazide (HYDRODIURIL) 12.5 MG tablet; Take 1 tablet (12.5 mg total) by mouth daily.  Recurrent major depressive disorder, remission status unspecified (HCC)  Class 2 obesity due to excess calories without serious comorbidity with body mass index (BMI) of 38.0 to 38.9 in adult    BP not to goal Add HCTZ daily to cozaar Continue to work on weight loss PHQ improved GAD same Increased zoloft to 100mg  daily Consider adding buspar-gave pt info on medication Continue counseling.  Follow up in 4 weeks.   Tandy Gaw, PA-C

## 2023-05-08 ENCOUNTER — Ambulatory Visit: Payer: BC Managed Care – PPO | Admitting: Physical Therapy

## 2023-05-08 NOTE — Telephone Encounter (Signed)
Benefits Investigation Details received from [employer]: [employer] PA required: Yes PA form and medical records faxed to [phone] at 2:58  Fax confirmation received May fill through: [facility] [place] [facility] Copay/Coinsurance: 20% Product Copay: 0% Administration Coinsurance: 20% Administration Copay: 0% Deductible: $3200 (met: [id number]) Out of Pocket Max: $6500 (met: $4010.27)

## 2023-05-09 ENCOUNTER — Other Ambulatory Visit: Payer: Self-pay

## 2023-05-09 DIAGNOSIS — R300 Dysuria: Secondary | ICD-10-CM

## 2023-05-10 LAB — URINALYSIS, ROUTINE W REFLEX MICROSCOPIC
Bilirubin Urine: NEGATIVE
Glucose, UA: NEGATIVE
Hyaline Cast: NONE SEEN /LPF
Ketones, ur: NEGATIVE
Nitrite: NEGATIVE
Specific Gravity, Urine: 1.032 (ref 1.001–1.035)
WBC, UA: >=60 /HPF — AB (ref 0–5)
pH: 5.5 (ref 5.0–8.0)

## 2023-05-10 LAB — URINE CULTURE
MICRO NUMBER:: 15056527
SPECIMEN QUALITY:: ADEQUATE

## 2023-05-10 LAB — MICROSCOPIC MESSAGE

## 2023-05-11 ENCOUNTER — Encounter: Payer: Self-pay | Admitting: Physician Assistant

## 2023-05-12 ENCOUNTER — Other Ambulatory Visit: Payer: Self-pay | Admitting: Physician Assistant

## 2023-05-12 DIAGNOSIS — F4323 Adjustment disorder with mixed anxiety and depressed mood: Secondary | ICD-10-CM | POA: Diagnosis not present

## 2023-05-12 MED ORDER — NITROFURANTOIN MONOHYD MACRO 100 MG PO CAPS: 100.0000 mg | ORAL_CAPSULE | Freq: Two times a day (BID) | ORAL | 0 refills | Status: DC

## 2023-05-12 MED ORDER — FLUCONAZOLE 150 MG PO TABS: 150.0000 mg | ORAL_TABLET | Freq: Once | ORAL | 0 refills | Status: AC

## 2023-05-12 NOTE — Progress Notes (Signed)
Yeast and bacteria detected. Sent diflucan and macrobid to the pharmacy.

## 2023-05-13 NOTE — Telephone Encounter (Signed)
Patient approved and was told of her copay and she was okay with it so will call and schedule her appointments.

## 2023-05-20 ENCOUNTER — Other Ambulatory Visit: Payer: Self-pay | Admitting: Physician Assistant

## 2023-05-20 DIAGNOSIS — K580 Irritable bowel syndrome with diarrhea: Secondary | ICD-10-CM

## 2023-05-20 DIAGNOSIS — M25561 Pain in right knee: Secondary | ICD-10-CM

## 2023-05-20 DIAGNOSIS — M1711 Unilateral primary osteoarthritis, right knee: Secondary | ICD-10-CM

## 2023-05-25 ENCOUNTER — Other Ambulatory Visit: Payer: Self-pay | Admitting: Physician Assistant

## 2023-05-25 DIAGNOSIS — I10 Essential (primary) hypertension: Secondary | ICD-10-CM

## 2023-05-26 DIAGNOSIS — F4323 Adjustment disorder with mixed anxiety and depressed mood: Secondary | ICD-10-CM | POA: Diagnosis not present

## 2023-06-02 DIAGNOSIS — R7309 Other abnormal glucose: Secondary | ICD-10-CM | POA: Diagnosis not present

## 2023-06-04 ENCOUNTER — Ambulatory Visit (INDEPENDENT_AMBULATORY_CARE_PROVIDER_SITE_OTHER): Payer: BC Managed Care – PPO | Admitting: Physician Assistant

## 2023-06-04 ENCOUNTER — Encounter: Payer: Self-pay | Admitting: Physician Assistant

## 2023-06-04 VITALS — BP 134/75 | HR 89 | Ht 61.0 in | Wt 202.0 lb

## 2023-06-04 DIAGNOSIS — I10 Essential (primary) hypertension: Secondary | ICD-10-CM

## 2023-06-04 DIAGNOSIS — F411 Generalized anxiety disorder: Secondary | ICD-10-CM

## 2023-06-04 DIAGNOSIS — Z780 Asymptomatic menopausal state: Secondary | ICD-10-CM

## 2023-06-04 DIAGNOSIS — K581 Irritable bowel syndrome with constipation: Secondary | ICD-10-CM

## 2023-06-04 DIAGNOSIS — E782 Mixed hyperlipidemia: Secondary | ICD-10-CM

## 2023-06-04 DIAGNOSIS — Z131 Encounter for screening for diabetes mellitus: Secondary | ICD-10-CM

## 2023-06-04 DIAGNOSIS — F410 Panic disorder [episodic paroxysmal anxiety] without agoraphobia: Secondary | ICD-10-CM

## 2023-06-04 DIAGNOSIS — Z79899 Other long term (current) drug therapy: Secondary | ICD-10-CM | POA: Diagnosis not present

## 2023-06-04 DIAGNOSIS — F339 Major depressive disorder, recurrent, unspecified: Secondary | ICD-10-CM

## 2023-06-04 MED ORDER — LINACLOTIDE 290 MCG PO CAPS
290.0000 ug | ORAL_CAPSULE | Freq: Every day | ORAL | 3 refills | Status: DC
Start: 2023-06-04 — End: 2024-06-22

## 2023-06-04 MED ORDER — ALPRAZOLAM 0.25 MG PO TABS
0.2500 mg | ORAL_TABLET | Freq: Two times a day (BID) | ORAL | 1 refills | Status: DC | PRN
Start: 2023-06-04 — End: 2024-01-30

## 2023-06-04 NOTE — Progress Notes (Signed)
Established Patient Office Visit  Subjective   Patient ID: NEPPIE BOYTER, female    DOB: 01-29-1971  Age: 52 y.o. MRN: [id number]  Chief Complaint  Patient presents with   Follow-up    MOOD   Hypertension    HPI Pt is a 52 yo female who presents to the clinic to follow up on mood and HTN.   Pt is doing much better with mood. Counseling has really been helping. Her anxiety is less. Work stress has been easier to deal with. No SI/HC. She did not have to increase her zoloft.   Checking BP at home and getting 120s over 70s. No CP, palpitations, headaches or vision changes.   She continues to work on weight loss. Down 4lbs on her home scale.   She continues to have constipation. Would like linzess. She has taken for a while but then stopped. She wants it back.   .. Active Ambulatory Problems    Diagnosis Date Noted   Hypertension    Seasonal allergies    Migraine    Asthma, allergic 11/18/2012   Epicondylitis elbow, medial 11/04/2014   Patellofemoral syndrome 11/04/2014   Class 2 obesity due to excess calories without serious comorbidity with body mass index (BMI) of 39.0 to 39.9 in adult 03/11/2015   Neuropathy 08/28/2016   Tinnitus of right ear 08/28/2016   PMDD (premenstrual dysphoric disorder) 01/21/2017   Constipation 04/18/2017   BMI 35.0-35.9,adult 04/20/2017   Heart murmur 04/20/2017   Uterine fibroid 01/05/2019   Ovarian cyst 01/05/2019   Ketonuria 10/04/2019   Isolated proteinuria with morphologic lesion 10/04/2019   Bilirubinuria 10/04/2019   Elevated LDL cholesterol level 10/05/2019   Left flank pain 10/05/2019   Left lower quadrant guarding 10/05/2019   Recurrent major depressive disorder (HCC) 12/15/2020   Hyperlipidemia 10/30/2021   Elevated fasting glucose 10/30/2021   Panic attacks 12/13/2022   GAD (generalized anxiety disorder) 12/13/2022   Class 3 severe obesity due to excess calories without serious comorbidity with body mass index (BMI) of 40.0 to  44.9 in adult [facility]) 02/25/2023   Primary osteoarthritis of right knee 02/25/2023   Irritable bowel syndrome with constipation 06/04/2023   Post-menopausal 06/04/2023   Resolved Ambulatory Problems    Diagnosis Date Noted   No Resolved Ambulatory Problems   Past Medical History:  Diagnosis Date   Uterine cancer (HCC)      ROS See HPI.    Objective:     BP 134/75 (BP Location: Right Arm, Patient Position: Sitting, Cuff Size: Large)   Pulse 89   Ht 5\' 1"  (1.549 m)   Wt 202 lb (91.6 kg)   SpO2 96%   BMI 38.17 kg/m  BP Readings from Last 3 Encounters:  06/04/23 134/75  05/06/23 (!) 156/76  03/25/23 (!) 160/90   Wt Readings from Last 3 Encounters:  06/04/23 202 lb (91.6 kg)  05/06/23 202 lb (91.6 kg)  02/25/23 208 lb (94.3 kg)    ..    06/04/2023    7:19 AM 05/06/2023    8:33 AM 12/13/2022    3:20 PM 12/15/2020   11:04 AM 10/04/2019   10:03 AM  Depression screen PHQ 2/9  Decreased Interest 1 1 3 2 1   Down, Depressed, Hopeless 1 1 3 2 1   PHQ - 2 Score 2 2 6 4 2   Altered sleeping 1 1 3 1 1   Tired, decreased energy 1 1 3 3 1   Change in appetite 0 0 3 3 1  Feeling bad or failure about yourself  1 2 3 3 1   Trouble concentrating 2 2 3 2 1   Moving slowly or fidgety/restless 0 1 1 1  0  Suicidal thoughts 0 0 0 0 0  PHQ-9 Score 7 9 22 17 7   Difficult doing work/chores Somewhat difficult Not difficult at all Very difficult Somewhat difficult Not difficult at all   ..    06/04/2023    7:20 AM 05/06/2023    8:34 AM 12/13/2022    3:21 PM 12/15/2020   11:05 AM  GAD 7 : Generalized Anxiety Score  Nervous, Anxious, on Edge 1 3 3 3   Control/stop worrying 1 3 3 2   Worry too much - different things 1 3 3    Trouble relaxing 1 3 3 3   Restless 1 3 2 2   Easily annoyed or irritable 1 3 2 3   Afraid - awful might happen 1 3 3    Total GAD 7 Score 7 21 19    Anxiety Difficulty Somewhat difficult Not difficult at all Very difficult Somewhat difficult      Physical Exam Constitutional:       Appearance: Normal appearance.  HENT:     Head: Normocephalic.  Cardiovascular:     Rate and Rhythm: Normal rate and regular rhythm.     Heart sounds: Murmur heard.  Pulmonary:     Effort: Pulmonary effort is normal.     Breath sounds: Normal breath sounds.  Musculoskeletal:     Cervical back: Normal range of motion and neck supple. No tenderness.     Right lower leg: No edema.     Left lower leg: No edema.  Lymphadenopathy:     Cervical: No cervical adenopathy.  Neurological:     General: No focal deficit present.     Mental Status: She is alert and oriented to person, place, and time.  Psychiatric:        Mood and Affect: Mood normal.       The 10-year ASCVD risk score (Arnett DK, et al., 2019) is: 3.1%    Assessment & Plan:  [name] KitchenMarland KitchenDeanna was seen today for follow-up and hypertension.  Diagnoses and all orders for this visit:  Panic attacks -     ALPRAZolam (XANAX) 0.25 MG tablet; Take 1 tablet (0.25 mg total) by mouth 2 (two) times daily as needed for anxiety.  Mixed hyperlipidemia -     Lipid Panel w/reflex Direct LDL  Screening for diabetes mellitus -     COMPLETE METABOLIC PANEL WITH GFR  Medication management -     Lipid Panel w/reflex Direct LDL -     COMPLETE METABOLIC PANEL WITH GFR -     TSH -     CBC w/Diff/Platelet -     VITAMIN D 25 Hydroxy (Vit-D Deficiency, Fractures)  Post-menopausal -     VITAMIN D 25 Hydroxy (Vit-D Deficiency, Fractures)  Irritable bowel syndrome with constipation -     linaclotide (LINZESS) 290 MCG CAPS capsule; Take 1 capsule (290 mcg total) by mouth daily.  Recurrent major depressive disorder, remission status unspecified (HCC)  GAD (generalized anxiety disorder)  Primary hypertension   PHQ/GAD improved Xanax refilled for as needed usage Continue zoloft Continue counseling BP improved on 2nd recheck and home readings look good Continue losartan and hydrochlorothiazide Check CMP Sent linzess for  constipation Coupon card given    Return in about 6 months (around 12/05/2023).    Tandy Gaw, PA-C

## 2023-06-04 NOTE — Patient Instructions (Signed)
Get labs Continue to work on weight loss with healthy diet and exercise

## 2023-06-09 DIAGNOSIS — F4323 Adjustment disorder with mixed anxiety and depressed mood: Secondary | ICD-10-CM | POA: Diagnosis not present

## 2023-07-03 DIAGNOSIS — R7309 Other abnormal glucose: Secondary | ICD-10-CM | POA: Diagnosis not present

## 2023-07-07 DIAGNOSIS — F4323 Adjustment disorder with mixed anxiety and depressed mood: Secondary | ICD-10-CM | POA: Diagnosis not present

## 2023-08-03 DIAGNOSIS — R7309 Other abnormal glucose: Secondary | ICD-10-CM | POA: Diagnosis not present

## 2023-08-04 ENCOUNTER — Other Ambulatory Visit: Payer: Self-pay | Admitting: Physician Assistant

## 2023-08-04 DIAGNOSIS — K580 Irritable bowel syndrome with diarrhea: Secondary | ICD-10-CM

## 2023-08-04 DIAGNOSIS — M1711 Unilateral primary osteoarthritis, right knee: Secondary | ICD-10-CM

## 2023-08-04 DIAGNOSIS — I10 Essential (primary) hypertension: Secondary | ICD-10-CM

## 2023-08-04 DIAGNOSIS — F339 Major depressive disorder, recurrent, unspecified: Secondary | ICD-10-CM

## 2023-08-04 DIAGNOSIS — F411 Generalized anxiety disorder: Secondary | ICD-10-CM

## 2023-08-04 DIAGNOSIS — M25561 Pain in right knee: Secondary | ICD-10-CM

## 2023-08-06 ENCOUNTER — Encounter: Payer: Self-pay | Admitting: Physician Assistant

## 2023-08-06 NOTE — Telephone Encounter (Signed)
Called and scheduled patient

## 2023-08-07 ENCOUNTER — Ambulatory Visit (INDEPENDENT_AMBULATORY_CARE_PROVIDER_SITE_OTHER): Payer: BC Managed Care – PPO | Admitting: Family Medicine

## 2023-08-07 ENCOUNTER — Encounter: Payer: Self-pay | Admitting: Family Medicine

## 2023-08-07 VITALS — BP 132/81 | HR 86 | Ht 61.0 in | Wt 204.2 lb

## 2023-08-07 DIAGNOSIS — N3001 Acute cystitis with hematuria: Secondary | ICD-10-CM

## 2023-08-07 DIAGNOSIS — R300 Dysuria: Secondary | ICD-10-CM

## 2023-08-07 DIAGNOSIS — B379 Candidiasis, unspecified: Secondary | ICD-10-CM | POA: Diagnosis not present

## 2023-08-07 DIAGNOSIS — T3695XA Adverse effect of unspecified systemic antibiotic, initial encounter: Secondary | ICD-10-CM | POA: Diagnosis not present

## 2023-08-07 LAB — POCT URINALYSIS DIP (CLINITEK)
Bilirubin, UA: NEGATIVE
Glucose, UA: NEGATIVE mg/dL
Ketones, POC UA: NEGATIVE mg/dL
Nitrite, UA: NEGATIVE
POC PROTEIN,UA: NEGATIVE
Spec Grav, UA: 1.02 (ref 1.010–1.025)
Urobilinogen, UA: 0.2 U/dL
pH, UA: 8.5 — AB (ref 5.0–8.0)

## 2023-08-07 LAB — POCT UA - MICROALBUMIN
Albumin/Creatinine Ratio, Urine, POC: <30
Creatinine, POC: 200 mg/dL
Microalbumin Ur, POC: 30 mg/L

## 2023-08-07 MED ORDER — CIPROFLOXACIN HCL 250 MG PO TABS
250.0000 mg | ORAL_TABLET | Freq: Two times a day (BID) | ORAL | 0 refills | Status: AC
Start: 2023-08-07 — End: 2023-08-10

## 2023-08-07 MED ORDER — FLUCONAZOLE 150 MG PO TABS
150.0000 mg | ORAL_TABLET | Freq: Once | ORAL | 0 refills | Status: AC
Start: 2023-08-07 — End: 2023-08-07

## 2023-08-07 NOTE — Assessment & Plan Note (Signed)
-   pt presents with dysuria and urinary frequency for two days. Did take azo for bladder distension  - POC UA positive for leukocytes and blood  - given diflucan for antibiotic induced yeast infection  - given cipro. Will culture and discussed if we need to change abx we will call patient and let her know - discussed with patient that she may need to get pxp medicine post intercourse to take to prevent UTIs

## 2023-08-07 NOTE — Progress Notes (Signed)
Acute Office Visit  Subjective:     Patient ID: Brittany Byrd, female    DOB: 10-24-1971, 52 y.o.   MRN: [id number]  Chief Complaint  Patient presents with   Urinary Tract Infection    Blood in urine, dysuria    Urinary Tract Infection  Associated symptoms include frequency. Pertinent negatives include no chills.   Patient is in today for dysuria. Admits to urinary frequency. Says she also noticed some blood on the toilet paper when she wiped. Says she has felt feverish as well. Has been getting UTIs with intercourse.   Review of Systems  Constitutional:  Negative for chills and fever.  Respiratory:  Negative for cough and shortness of breath.   Cardiovascular:  Negative for chest pain.  Genitourinary:  Positive for dysuria and frequency.  Neurological:  Negative for headaches.        Objective:    BP 132/81   Pulse 86   Ht 5\' 1"  (1.549 m)   Wt 204 lb 4 oz (92.6 kg)   SpO2 100%   BMI 38.59 kg/m    Physical Exam Vitals and nursing note reviewed.  Constitutional:      General: She is not in acute distress.    Appearance: Normal appearance.  HENT:     Head: Normocephalic and atraumatic.     Right Ear: External ear normal.     Left Ear: External ear normal.     Nose: Nose normal.  Eyes:     Conjunctiva/sclera: Conjunctivae normal.  Cardiovascular:     Rate and Rhythm: Normal rate.  Pulmonary:     Effort: Pulmonary effort is normal.  Neurological:     General: No focal deficit present.     Mental Status: She is alert and oriented to person, place, and time.  Psychiatric:        Mood and Affect: Mood normal.        Behavior: Behavior normal.        Thought Content: Thought content normal.        Judgment: Judgment normal.     Results for orders placed or performed in visit on 08/07/23  POCT URINALYSIS DIP (CLINITEK)  Result Value Ref Range   Color, UA yellow yellow   Clarity, UA clear clear   Glucose, UA negative negative mg/dL   Bilirubin, UA  negative negative   Ketones, POC UA negative negative mg/dL   Spec Grav, UA 2.952 8.413 - 1.025   Blood, UA trace-intact (A) negative   pH, UA 8.5 (A) 5.0 - 8.0   POC PROTEIN,UA negative negative, trace   Urobilinogen, UA 0.2 0.2 or 1.0 E.U./dL   Nitrite, UA Negative Negative   Leukocytes, UA Small (1+) (A) Negative  POCT UA - Microalbumin  Result Value Ref Range   Microalbumin Ur, POC 30 mg/L   Creatinine, POC 200 mg/dL   Albumin/Creatinine Ratio, Urine, POC <30         Assessment & Plan:   Problem List Items Addressed This Visit       Genitourinary   Acute cystitis with hematuria - Primary    - pt presents with dysuria and urinary frequency for two days. Did take azo for bladder distension  - POC UA positive for leukocytes and blood  - given diflucan for antibiotic induced yeast infection  - given cipro. Will culture and discussed if we need to change abx we will call patient and let her know - discussed with patient that she may  need to get pxp medicine post intercourse to take to prevent UTIs       Relevant Medications   ciprofloxacin (CIPRO) 250 MG tablet   Other Relevant Orders   Urine Culture     Other   Antibiotic-induced yeast infection   Relevant Medications   fluconazole (DIFLUCAN) 150 MG tablet   Other Visit Diagnoses     Dysuria       Relevant Orders   POCT URINALYSIS DIP (CLINITEK) (Completed)   POCT UA - Microalbumin (Completed)       Meds ordered this encounter  Medications   ciprofloxacin (CIPRO) 250 MG tablet    Sig: Take 1 tablet (250 mg total) by mouth 2 (two) times daily for 3 days.    Dispense:  6 tablet    Refill:  0   fluconazole (DIFLUCAN) 150 MG tablet    Sig: Take 1 tablet (150 mg total) by mouth once for 1 dose. If no better in 72 hours take second pill    Dispense:  2 tablet    Refill:  0    No follow-ups on file.  Charlton Amor, DO

## 2023-08-12 ENCOUNTER — Encounter (INDEPENDENT_AMBULATORY_CARE_PROVIDER_SITE_OTHER): Payer: BC Managed Care – PPO | Admitting: Family Medicine

## 2023-08-12 DIAGNOSIS — B379 Candidiasis, unspecified: Secondary | ICD-10-CM

## 2023-08-12 DIAGNOSIS — N3001 Acute cystitis with hematuria: Secondary | ICD-10-CM

## 2023-08-12 LAB — URINE CULTURE

## 2023-08-13 MED ORDER — FLUCONAZOLE 150 MG PO TABS: 150.0000 mg | ORAL_TABLET | Freq: Once | ORAL | 0 refills | Status: AC

## 2023-08-13 MED ORDER — NITROFURANTOIN MONOHYD MACRO 100 MG PO CAPS
100.0000 mg | ORAL_CAPSULE | Freq: Two times a day (BID) | ORAL | 0 refills | Status: AC
Start: 2023-08-13 — End: 2023-08-18

## 2023-08-13 NOTE — Telephone Encounter (Signed)
Please see the MyChart message reply(ies) for my assessment and plan.    This patient gave consent for this Medical Advice Message and is aware that it may result in a bill to [employer], as well as the possibility of receiving a bill for a co-payment or deductible. They are an established patient, but are not seeking medical advice exclusively about a problem treated during an in person or video visit in the last seven days. I did not recommend an in person or video visit within seven days of my reply.    I spent a total of 12 minutes cumulative time within 7 days through [employer].  Sending in macrobid for uti. Looks sensitive from culture   Charlton Amor, DO

## 2023-08-20 DIAGNOSIS — F4323 Adjustment disorder with mixed anxiety and depressed mood: Secondary | ICD-10-CM | POA: Diagnosis not present

## 2023-08-24 ENCOUNTER — Other Ambulatory Visit: Payer: Self-pay | Admitting: Physician Assistant

## 2023-08-24 DIAGNOSIS — I10 Essential (primary) hypertension: Secondary | ICD-10-CM

## 2023-09-02 DIAGNOSIS — R7309 Other abnormal glucose: Secondary | ICD-10-CM | POA: Diagnosis not present

## 2023-09-23 ENCOUNTER — Ambulatory Visit (INDEPENDENT_AMBULATORY_CARE_PROVIDER_SITE_OTHER): Payer: BC Managed Care – PPO | Admitting: Medical-Surgical

## 2023-09-23 ENCOUNTER — Encounter: Payer: Self-pay | Admitting: Medical-Surgical

## 2023-09-23 VITALS — BP 158/89 | HR 90 | Resp 20 | Ht 61.0 in | Wt 205.1 lb

## 2023-09-23 DIAGNOSIS — M2550 Pain in unspecified joint: Secondary | ICD-10-CM

## 2023-09-23 MED ORDER — METHYLPREDNISOLONE ACETATE 80 MG/ML IJ SUSP
80.0000 mg | Freq: Once | INTRAMUSCULAR | Status: AC
Start: 2023-09-23 — End: 2023-09-23
  Administered 2023-09-23: 80 mg via INTRAMUSCULAR

## 2023-09-23 NOTE — Progress Notes (Unsigned)
        Established patient visit  History, exam, impression, and plan:  1. Polyarthralgia Very pleasant 52 year old female presenting today with reports of approximately 3 weeks of increased arthralgias affecting the low back, bilateral hips, and bilateral knees.  Notes that the pain is on both sides, worse when getting up after sitting for prolonged periods of time.  Has difficulty getting moving in the morning that can last for several hours.  She was at the beach last week and had difficulty with going up and down stairs due to the pain.  Has tried stretching, heat, diclofenac, naproxen, turmeric, collagen, creatine, and glycine which has not provided much benefit.  Was previously taking a magnesium supplement but stopped this on her own a while back.  On exam, she is moving normally although her gait may be slower than usual due to discomfort.  Discussed various options for evaluation.  She feels like this may be a severe arthritis flare that has not responded to her other methods.  Reports that she does not do well with oral steroids at all but does respond well to the injections.  Offered possible lab work to evaluate inflammatory markers, blood counts, ANA, etc. however she would like to hold off on this for now.  Given the concern for multiple joints affected, consider possible arthralgia rheumatica.  For now, plan continued conservative measures with home exercises.  Depo-Medrol 80 mg IM x 1 given in office today.  She will monitor over the next few days and let us know if this is not helpful.  If her symptoms continue, we may need to do labs, imaging, and consider possibly the addition of Lyrica or Cymbalta. - methylPREDNISolone acetate (DEPO-MEDROL) injection 80 mg   Procedures performed this visit: None.  Return if symptoms worsen or fail to improve.  __________________________________ Thayer Ohm, DNP, APRN, FNP-BC Primary Care and Sports Medicine [facility]  [place]

## 2023-09-24 DIAGNOSIS — F4323 Adjustment disorder with mixed anxiety and depressed mood: Secondary | ICD-10-CM | POA: Diagnosis not present

## 2023-10-03 DIAGNOSIS — R7309 Other abnormal glucose: Secondary | ICD-10-CM | POA: Diagnosis not present

## 2023-10-10 ENCOUNTER — Encounter: Payer: Self-pay | Admitting: Sports Medicine

## 2023-10-10 ENCOUNTER — Ambulatory Visit (INDEPENDENT_AMBULATORY_CARE_PROVIDER_SITE_OTHER): Payer: BC Managed Care – PPO | Admitting: Sports Medicine

## 2023-10-10 ENCOUNTER — Other Ambulatory Visit (INDEPENDENT_AMBULATORY_CARE_PROVIDER_SITE_OTHER): Payer: BC Managed Care – PPO

## 2023-10-10 DIAGNOSIS — M1711 Unilateral primary osteoarthritis, right knee: Secondary | ICD-10-CM

## 2023-10-10 MED ORDER — HYALURONAN 30 MG/2ML IX SOSY
30.0000 mg | PREFILLED_SYRINGE | Freq: Once | INTRA_ARTICULAR | Status: AC
Start: 2023-10-10 — End: 2023-10-10
  Administered 2023-10-10: 30 mg via INTRA_ARTICULAR

## 2023-10-10 NOTE — Assessment & Plan Note (Signed)
X-ray and MRI confirmed severe medial compartment osteoarthritis. Did not get sufficient long-term relief with a steroid injection, we started Orthovisc today, return in 1 week for Orthovisc No. 2 of 4. Continue diclofenac as needed. Discussed avoidance of deep knee bending, impact and twisting.

## 2023-10-10 NOTE — Progress Notes (Signed)
    Procedures performed today:    Procedure: Real-time Ultrasound Guided injection of the right knee Device: Samsung HS60  Verbal informed consent obtained.  Time-out conducted.  Noted no overlying erythema, induration, or other signs of local infection.  Skin prepped in a sterile fashion.  Local anesthesia: Topical Ethyl chloride.  With sterile technique and under real time ultrasound guidance: Mild effusion noted, 30 mg/2 mL of OrthoVisc (sodium hyaluronate) in a prefilled syringe was injected easily into the knee through a 22-gauge needle. Completed without difficulty. Advised to call if fevers/chills, erythema, induration, drainage, or persistent bleeding.  Images permanently stored and available for review in PACS.  Impression: Technically successful ultrasound guided injection.  Independent interpretation of notes and tests performed by another provider:   None.  Brief History, Exam, Impression, and Recommendations:    Primary osteoarthritis of right knee X-ray and MRI confirmed severe medial compartment osteoarthritis. Did not get sufficient long-term relief with a steroid injection, we started Orthovisc today, return in 1 week for Orthovisc No. 2 of 4. Continue diclofenac as needed. Discussed avoidance of deep knee bending, impact and twisting.    ____________________________________________ Ihor Austin. Benjamin Stain, M.D., ABFM., CAQSM., AME. Primary Care and Sports Medicine [place] [facility]  Adjunct Professor of Family Medicine  [employer] [facility] [employer]  Restaurant manager, fast food

## 2023-10-17 ENCOUNTER — Other Ambulatory Visit (INDEPENDENT_AMBULATORY_CARE_PROVIDER_SITE_OTHER): Payer: BC Managed Care – PPO

## 2023-10-17 ENCOUNTER — Encounter: Payer: Self-pay | Admitting: Sports Medicine

## 2023-10-17 ENCOUNTER — Ambulatory Visit (INDEPENDENT_AMBULATORY_CARE_PROVIDER_SITE_OTHER): Payer: BC Managed Care – PPO | Admitting: Sports Medicine

## 2023-10-17 DIAGNOSIS — M1711 Unilateral primary osteoarthritis, right knee: Secondary | ICD-10-CM | POA: Diagnosis not present

## 2023-10-17 MED ORDER — HYALURONAN 30 MG/2ML IX SOSY
30.0000 mg | PREFILLED_SYRINGE | Freq: Once | INTRA_ARTICULAR | Status: AC
Start: 2023-10-17 — End: 2023-10-17
  Administered 2023-10-17: 30 mg via INTRA_ARTICULAR

## 2023-10-17 NOTE — Progress Notes (Signed)
    Procedures performed today:    Procedure: Real-time Ultrasound Guided injection of the right knee Device: Samsung HS60  Verbal informed consent obtained.  Time-out conducted.  Noted no overlying erythema, induration, or other signs of local infection.  Skin prepped in a sterile fashion.  Local anesthesia: Topical Ethyl chloride.  With sterile technique and under real time ultrasound guidance: Mild effusion noted, 30 mg/2 mL of OrthoVisc (sodium hyaluronate) in a prefilled syringe was injected easily into the knee through a 22-gauge needle. Completed without difficulty. Advised to call if fevers/chills, erythema, induration, drainage, or persistent bleeding.  Images permanently stored and available for review in PACS.  Impression: Technically successful ultrasound guided injection.  Independent interpretation of notes and tests performed by another provider:   None.  Brief History, Exam, Impression, and Recommendations:    Primary osteoarthritis of right knee Please see prior notes, Orthovisc No. 2 of 4 right knee, return in 1 week for #3 of 4.    ____________________________________________ Ihor Austin. Benjamin Stain, M.D., ABFM., CAQSM., AME. Primary Care and Sports Medicine [facility] [facility]  Adjunct Professor of Family Medicine  [employer] [employer] [facility] [employer]  Restaurant manager, fast food

## 2023-10-17 NOTE — Addendum Note (Signed)
Addended by: Carren Rang A on: 10/17/2023 04:25 PM   Modules accepted: Orders

## 2023-10-17 NOTE — Assessment & Plan Note (Signed)
Please see prior notes, Orthovisc No. 2 of 4 right knee, return in 1 week for #3 of 4.

## 2023-10-24 ENCOUNTER — Ambulatory Visit (INDEPENDENT_AMBULATORY_CARE_PROVIDER_SITE_OTHER): Payer: BC Managed Care – PPO | Admitting: Sports Medicine

## 2023-10-24 ENCOUNTER — Other Ambulatory Visit (INDEPENDENT_AMBULATORY_CARE_PROVIDER_SITE_OTHER): Payer: BC Managed Care – PPO

## 2023-10-24 DIAGNOSIS — M1711 Unilateral primary osteoarthritis, right knee: Secondary | ICD-10-CM

## 2023-10-24 MED ORDER — HYALURONAN 30 MG/2ML IX SOSY
30.0000 mg | PREFILLED_SYRINGE | Freq: Once | INTRA_ARTICULAR | Status: AC
  Administered 2023-10-24: 30 mg via INTRA_ARTICULAR

## 2023-10-24 NOTE — Addendum Note (Signed)
Addended by: Carren Rang A on: 10/24/2023 11:52 AM   Modules accepted: Orders

## 2023-10-24 NOTE — Assessment & Plan Note (Signed)
Orthovisc 3 of 4 right knee, feeling some improvement, return in about a week for 4 of 4.

## 2023-10-24 NOTE — Progress Notes (Signed)
    Procedures performed today:    Procedure: Real-time Ultrasound Guided injection of the right knee Device: Samsung HS60  Verbal informed consent obtained.  Time-out conducted.  Noted no overlying erythema, induration, or other signs of local infection.  Skin prepped in a sterile fashion.  Local anesthesia: Topical Ethyl chloride.  With sterile technique and under real time ultrasound guidance: Mild effusion noted, 30 mg/2 mL of OrthoVisc (sodium hyaluronate) in a prefilled syringe was injected easily into the knee through a 22-gauge needle. Completed without difficulty. Advised to call if fevers/chills, erythema, induration, drainage, or persistent bleeding.  Images permanently stored and available for review in PACS.  Impression: Technically successful ultrasound guided injection.  Independent interpretation of notes and tests performed by another provider:   None.  Brief History, Exam, Impression, and Recommendations:    Primary osteoarthritis of right knee Orthovisc 3 of 4 right knee, feeling some improvement, return in about a week for 4 of 4.    ____________________________________________ Ihor Austin. Benjamin Stain, M.D., ABFM., CAQSM., AME. Primary Care and Sports Medicine [place] [facility]  Adjunct Professor of Family Medicine  [employer] [facility] [employer]  Restaurant manager, fast food

## 2023-11-02 DIAGNOSIS — R7309 Other abnormal glucose: Secondary | ICD-10-CM | POA: Diagnosis not present

## 2023-11-03 ENCOUNTER — Other Ambulatory Visit (INDEPENDENT_AMBULATORY_CARE_PROVIDER_SITE_OTHER): Payer: BC Managed Care – PPO

## 2023-11-03 ENCOUNTER — Ambulatory Visit (INDEPENDENT_AMBULATORY_CARE_PROVIDER_SITE_OTHER): Payer: BC Managed Care – PPO | Admitting: Sports Medicine

## 2023-11-03 ENCOUNTER — Encounter: Payer: Self-pay | Admitting: Sports Medicine

## 2023-11-03 DIAGNOSIS — M1711 Unilateral primary osteoarthritis, right knee: Secondary | ICD-10-CM | POA: Diagnosis not present

## 2023-11-03 MED ORDER — HYALURONAN 30 MG/2ML IX SOSY
30.0000 mg | PREFILLED_SYRINGE | Freq: Once | INTRA_ARTICULAR | Status: AC
  Administered 2023-11-03: 30 mg via INTRA_ARTICULAR

## 2023-11-03 NOTE — Progress Notes (Signed)
    Procedures performed today:    Procedure: Real-time Ultrasound Guided injection of the right knee Device: Samsung HS60  Verbal informed consent obtained.  Time-out conducted.  Noted no overlying erythema, induration, or other signs of local infection.  Skin prepped in a sterile fashion.  Local anesthesia: Topical Ethyl chloride.  With sterile technique and under real time ultrasound guidance: moderate effusion noted, 30 mg/2 mL of OrthoVisc (sodium hyaluronate) in a prefilled syringe was injected easily into the knee through a 22-gauge needle. Completed without difficulty  Advised to call if fevers/chills, erythema, induration, drainage, or persistent bleeding.  Images permanently stored and available for review in PACS.  Impression: Technically successful ultrasound guided injection.  Independent interpretation of notes and tests performed by another provider:   None.  Brief History, Exam, Impression, and Recommendations:    Primary osteoarthritis of right knee Orthovisc 4 of 4 right knee, doing a lot better. Return to see me as needed.    ____________________________________________ Ihor Austin. Benjamin Stain, M.D., ABFM., CAQSM., AME. Primary Care and Sports Medicine [place] [facility]  Adjunct Professor of Family Medicine  [employer] [facility] [employer]  Restaurant manager, fast food

## 2023-11-03 NOTE — Assessment & Plan Note (Signed)
Orthovisc 4 of 4 right knee, doing a lot better. Return to see me as needed.

## 2023-11-03 NOTE — Addendum Note (Signed)
Addended by: Carren Rang A on: 11/03/2023 04:52 PM   Modules accepted: Orders

## 2023-11-19 DIAGNOSIS — F4323 Adjustment disorder with mixed anxiety and depressed mood: Secondary | ICD-10-CM | POA: Diagnosis not present

## 2023-11-23 ENCOUNTER — Encounter: Payer: Self-pay | Admitting: Physician Assistant

## 2023-11-23 ENCOUNTER — Other Ambulatory Visit: Payer: Self-pay | Admitting: Physician Assistant

## 2023-11-23 DIAGNOSIS — K580 Irritable bowel syndrome with diarrhea: Secondary | ICD-10-CM

## 2023-11-23 DIAGNOSIS — M1711 Unilateral primary osteoarthritis, right knee: Secondary | ICD-10-CM

## 2023-11-23 DIAGNOSIS — M25561 Pain in right knee: Secondary | ICD-10-CM

## 2023-11-24 ENCOUNTER — Other Ambulatory Visit: Payer: Self-pay | Admitting: Family Medicine

## 2023-11-24 DIAGNOSIS — I10 Essential (primary) hypertension: Secondary | ICD-10-CM

## 2023-11-24 MED ORDER — WEGOVY 0.25 MG/0.5ML ~~LOC~~ SOAJ: 0.2500 mg | SUBCUTANEOUS | 0 refills | Status: DC

## 2023-12-05 ENCOUNTER — Encounter: Payer: Self-pay | Admitting: Physician Assistant

## 2023-12-05 ENCOUNTER — Ambulatory Visit (INDEPENDENT_AMBULATORY_CARE_PROVIDER_SITE_OTHER): Payer: BC Managed Care – PPO | Admitting: Physician Assistant

## 2023-12-05 VITALS — BP 155/91 | HR 86 | Resp 12 | Ht 60.0 in | Wt 214.0 lb

## 2023-12-05 DIAGNOSIS — M1711 Unilateral primary osteoarthritis, right knee: Secondary | ICD-10-CM

## 2023-12-05 DIAGNOSIS — I10 Essential (primary) hypertension: Secondary | ICD-10-CM

## 2023-12-05 DIAGNOSIS — E782 Mixed hyperlipidemia: Secondary | ICD-10-CM

## 2023-12-05 DIAGNOSIS — Z131 Encounter for screening for diabetes mellitus: Secondary | ICD-10-CM

## 2023-12-05 DIAGNOSIS — Z23 Encounter for immunization: Secondary | ICD-10-CM

## 2023-12-05 DIAGNOSIS — F410 Panic disorder [episodic paroxysmal anxiety] without agoraphobia: Secondary | ICD-10-CM

## 2023-12-05 DIAGNOSIS — Z6841 Body Mass Index (BMI) 40.0 and over, adult: Secondary | ICD-10-CM | POA: Diagnosis not present

## 2023-12-05 DIAGNOSIS — T887XXA Unspecified adverse effect of drug or medicament, initial encounter: Secondary | ICD-10-CM

## 2023-12-05 DIAGNOSIS — E66813 Obesity, class 3: Secondary | ICD-10-CM

## 2023-12-05 DIAGNOSIS — E11649 Type 2 diabetes mellitus with hypoglycemia without coma: Secondary | ICD-10-CM

## 2023-12-05 DIAGNOSIS — F411 Generalized anxiety disorder: Secondary | ICD-10-CM

## 2023-12-05 DIAGNOSIS — F339 Major depressive disorder, recurrent, unspecified: Secondary | ICD-10-CM

## 2023-12-05 DIAGNOSIS — R8290 Unspecified abnormal findings in urine: Secondary | ICD-10-CM

## 2023-12-05 DIAGNOSIS — R3129 Other microscopic hematuria: Secondary | ICD-10-CM | POA: Insufficient documentation

## 2023-12-05 LAB — POCT URINALYSIS DIP (CLINITEK)
Bilirubin, UA: NEGATIVE
Glucose, UA: NEGATIVE mg/dL
Ketones, POC UA: NEGATIVE mg/dL
Leukocytes, UA: NEGATIVE
Nitrite, UA: NEGATIVE
POC PROTEIN,UA: NEGATIVE
Spec Grav, UA: 1.02 (ref 1.010–1.025)
Urobilinogen, UA: 0.2 U/dL
pH, UA: 6 (ref 5.0–8.0)

## 2023-12-05 MED ORDER — HYDROCHLOROTHIAZIDE 25 MG PO TABS: 25.0000 mg | ORAL_TABLET | Freq: Every day | ORAL | 1 refills | Status: DC

## 2023-12-05 MED ORDER — TIRZEPATIDE 2.5 MG/0.5ML ~~LOC~~ SOAJ: 2.5000 mg | SUBCUTANEOUS | 0 refills | Status: DC

## 2023-12-05 MED ORDER — SERTRALINE HCL 50 MG PO TABS
50.0000 mg | ORAL_TABLET | Freq: Every day | ORAL | 3 refills | Status: DC
Start: 2023-12-05 — End: 2024-06-22

## 2023-12-05 MED ORDER — ONDANSETRON 8 MG PO TBDP: 8.0000 mg | ORAL_TABLET | Freq: Three times a day (TID) | ORAL | 1 refills | Status: DC | PRN

## 2023-12-05 NOTE — Progress Notes (Signed)
 Established Patient Office Visit  Subjective   Patient ID: Brittany Byrd, female    DOB: 06-05-1971  Age: 53 y.o. MRN: [id number]  Chief Complaint  Patient presents with   Medical Management of Chronic Issues    6 mo fup mood : panic attacks, phq gad    HPI Pt is a 53 yo female who presents to the clinic for medication refills.   Pt is doing better with mood overall. The christmas season was a little rough but she is already feeling better. She is on zoloft  and does not want to change dosage.   Pt had an abnormal urine odor today. She denies any frequency problems or pain. She has hx of UTI's and would like tested today.   She is motivated to lose weight. She would like to try mounjaro . She came fasting to have labs done as well. She is not checking BP's at home. Denies any CP, palpitations, headaches or vision changes.   .. Active Ambulatory Problems    Diagnosis Date Noted   Hypertension    Seasonal allergies    Migraine    Asthma, allergic 11/18/2012   Epicondylitis elbow, medial 11/04/2014   Patellofemoral syndrome 11/04/2014   Class 2 obesity due to excess calories without serious comorbidity with body mass index (BMI) of 39.0 to 39.9 in adult 03/11/2015   Neuropathy 08/28/2016   Tinnitus of right ear 08/28/2016   PMDD (premenstrual dysphoric disorder) 01/21/2017   Constipation 04/18/2017   BMI 35.0-35.9,adult 04/20/2017   Heart murmur 04/20/2017   Uterine fibroid 01/05/2019   Ovarian cyst 01/05/2019   Ketonuria 10/04/2019   Isolated proteinuria with morphologic lesion 10/04/2019   Bilirubinuria 10/04/2019   Elevated LDL cholesterol level 10/05/2019   Left flank pain 10/05/2019   Left lower quadrant guarding 10/05/2019   Recurrent major depressive disorder (HCC) 12/15/2020   Hyperlipidemia 10/30/2021   Elevated fasting glucose 10/30/2021   Panic attacks 12/13/2022   GAD (generalized anxiety disorder) 12/13/2022   Class 3 severe obesity due to excess calories  without serious comorbidity with body mass index (BMI) of 40.0 to 44.9 in adult [facility]) 02/25/2023   Primary osteoarthritis of right knee 02/25/2023   Irritable bowel syndrome with constipation 06/04/2023   Post-menopausal 06/04/2023   Acute cystitis with hematuria 08/07/2023   Antibiotic-induced yeast infection 08/07/2023   Other microscopic hematuria 12/05/2023   Abnormal urine odor 12/09/2023   Resolved Ambulatory Problems    Diagnosis Date Noted   No Resolved Ambulatory Problems   Past Medical History:  Diagnosis Date   Uterine cancer (HCC)      ROS    Objective:     BP (!) 155/91   Pulse 86   Resp 12   Ht 5' (1.524 m)   Wt 214 lb (97.1 kg)   SpO2 97%   BMI 41.79 kg/m  BP Readings from Last 3 Encounters:  12/05/23 (!) 155/91  09/23/23 (!) 158/89  08/07/23 132/81   Wt Readings from Last 3 Encounters:  12/05/23 214 lb (97.1 kg)  09/23/23 205 lb 1.9 oz (93 kg)  08/07/23 204 lb 4 oz (92.6 kg)    ..    12/05/2023    8:29 AM 06/04/2023    7:19 AM 05/06/2023    8:33 AM 12/13/2022    3:20 PM 12/15/2020   11:04 AM  Depression screen PHQ 2/9  Decreased Interest 1 1 1 3 2   Down, Depressed, Hopeless 1 1 1 3 2   PHQ - 2 Score  2 2 2 6 4   Altered sleeping 0 1 1 3 1   Tired, decreased energy 0 1 1 3 3   Change in appetite 3 0 0 3 3  Feeling bad or failure about yourself  1 1 2 3 3   Trouble concentrating 1 2 2 3 2   Moving slowly or fidgety/restless 0 0 1 1 1   Suicidal thoughts 0 0 0 0 0  PHQ-9 Score 7 7 9 22 17   Difficult doing work/chores Not difficult at all Somewhat difficult Not difficult at all Very difficult Somewhat difficult   ..    12/05/2023    8:29 AM 06/04/2023    7:20 AM 05/06/2023    8:34 AM 12/13/2022    3:21 PM  GAD 7 : Generalized Anxiety Score  Nervous, Anxious, on Edge 2 1 3 3   Control/stop worrying 2 1 3 3   Worry too much - different things 2 1 3 3   Trouble relaxing 2 1 3 3   Restless 1 1 3 2   Easily annoyed or irritable 2 1 3 2   Afraid - awful might  happen 2 1 3 3   Total GAD 7 Score 13 7 21 19   Anxiety Difficulty Somewhat difficult Somewhat difficult Not difficult at all Very difficult      Physical Exam Constitutional:      Appearance: Normal appearance.  HENT:     Head: Normocephalic.  Cardiovascular:     Rate and Rhythm: Normal rate and regular rhythm.  Pulmonary:     Effort: Pulmonary effort is normal.     Breath sounds: Normal breath sounds.  Musculoskeletal:     Right lower leg: No edema.     Left lower leg: No edema.  Neurological:     General: No focal deficit present.     Mental Status: She is alert and oriented to person, place, and time.  Psychiatric:        Mood and Affect: Mood normal.        The 10-year ASCVD risk score (Arnett DK, et al., 2019) is: 5.5%    Assessment & Plan:  Brittany Byrd was seen today for medical management of chronic issues.  Diagnoses and all orders for this visit:  Class 3 severe obesity due to excess calories without serious comorbidity with body mass index (BMI) of 40.0 to 44.9 in adult (HCC) -     Lipid panel -     CMP14+EGFR -     TSH + free T4 -     VITAMIN D  25 Hydroxy (Vit-D Deficiency, Fractures) -     tirzepatide  (MOUNJARO ) 2.5 MG/0.5ML Pen; Inject 2.5 mg into the skin once a week.  Immunization due Environmental Education Officer Covid -19 Vaccine 53yrs and older  Abnormal urine odor -     Urine Culture -     POCT URINALYSIS DIP (CLINITEK)  Mixed hyperlipidemia -     Lipid panel -     tirzepatide  (MOUNJARO ) 2.5 MG/0.5ML Pen; Inject 2.5 mg into the skin once a week.  Panic attacks  Primary osteoarthritis of right knee -     tirzepatide  (MOUNJARO ) 2.5 MG/0.5ML Pen; Inject 2.5 mg into the skin once a week.  Screening for diabetes mellitus -     CMP14+EGFR  Primary hypertension -     tirzepatide  (MOUNJARO ) 2.5 MG/0.5ML Pen; Inject 2.5 mg into the skin once a week. -     hydrochlorothiazide  (HYDRODIURIL ) 25 MG tablet; Take 1 tablet (25 mg total) by mouth  daily.  Recurrent major depressive disorder, remission status unspecified (HCC) -     sertraline  (ZOLOFT ) 50 MG tablet; Take 1 tablet (50 mg total) by mouth daily.  GAD (generalized anxiety disorder) -     sertraline  (ZOLOFT ) 50 MG tablet; Take 1 tablet (50 mg total) by mouth daily.  Other microscopic hematuria  Medication side effect -     ondansetron  (ZOFRAN -ODT) 8 MG disintegrating tablet; Take 1 tablet (8 mg total) by mouth every 8 (eight) hours as needed.   BP not to goal  Increased hydrochlorothiazide  to 25mg  Recheck in 4 weeks Keep BP log at home  Refilled zoloft  Uses xanax  as needed for panic attacks GAD not to goal  ..Discussed low carb diet with 1500 calories and 80g of protein.  Exercising at least 150 minutes a week.  [employer] could be a chief technology officer.  Discussed options Sent mounjaro  discussed how to use and side effects Follow up in 4 weeks  UA shows microscopic blood Will culture to confirm not bacteria in urine Recheck UA in 4 weeks to make sure resolution of blood  Return in about 4 weeks (around 01/02/2024) for weight/BP/urine.    Shamina Etheridge, PA-C

## 2023-12-06 LAB — VITAMIN D 25 HYDROXY (VIT D DEFICIENCY, FRACTURES): Vit D, 25-Hydroxy: 27.7 ng/mL — ABNORMAL LOW (ref 30.0–100.0)

## 2023-12-06 LAB — CMP14+EGFR
ALT: 35 [IU]/L — ABNORMAL HIGH (ref 0–32)
AST: 24 [IU]/L (ref 0–40)
Albumin: 4.4 g/dL (ref 3.8–4.9)
Alkaline Phosphatase: 67 [IU]/L (ref 44–121)
BUN/Creatinine Ratio: 20 (ref 9–23)
BUN: 11 mg/dL (ref 6–24)
Bilirubin Total: 0.3 mg/dL (ref 0.0–1.2)
CO2: 23 mmol/L (ref 20–29)
Calcium: 9.3 mg/dL (ref 8.7–10.2)
Chloride: 102 mmol/L (ref 96–106)
Creatinine, Ser: 0.56 mg/dL — ABNORMAL LOW (ref 0.57–1.00)
Globulin, Total: 2.2 g/dL (ref 1.5–4.5)
Glucose: 145 mg/dL — ABNORMAL HIGH (ref 70–99)
Potassium: 4.5 mmol/L (ref 3.5–5.2)
Sodium: 140 mmol/L (ref 134–144)
Total Protein: 6.6 g/dL (ref 6.0–8.5)
eGFR: 108 mL/min/{1.73_m2} (ref >59)

## 2023-12-06 LAB — TSH+FREE T4
Free T4: 1.22 ng/dL (ref 0.82–1.77)
TSH: 1.46 u[IU]/mL (ref 0.450–4.500)

## 2023-12-06 LAB — LIPID PANEL
Chol/HDL Ratio: 5.5 {ratio} — ABNORMAL HIGH (ref 0.0–4.4)
Cholesterol, Total: 247 mg/dL — ABNORMAL HIGH (ref 100–199)
HDL: 45 mg/dL (ref >39)
LDL Chol Calc (NIH): 175 mg/dL — ABNORMAL HIGH (ref 0–99)
Triglycerides: 145 mg/dL (ref 0–149)
VLDL Cholesterol Cal: 27 mg/dL (ref 5–40)

## 2023-12-08 ENCOUNTER — Encounter: Payer: Self-pay | Admitting: Physician Assistant

## 2023-12-08 DIAGNOSIS — R7301 Impaired fasting glucose: Secondary | ICD-10-CM

## 2023-12-08 DIAGNOSIS — R5383 Other fatigue: Secondary | ICD-10-CM

## 2023-12-08 LAB — URINE CULTURE

## 2023-12-08 NOTE — Progress Notes (Signed)
 Nakkia,   Thyroid  looks great.  Vitamin D  low. Increase by 2000 units daily and make sure taking with dairy for better absorption.  Kidney function looks great.  ALT, liver enzyme, up just a little.  Glucose is elevated as well.we will add further testing for diabetes.  (Please add A1C)  Elevated bad cholesterol as well. 10 year risk 5.5 percent which is still pretty low. I would consider low dose cholesterol medication due to age/weight/risk factors. Are you ok with starting?   SABRASABRAThe 10-year ASCVD risk score (Arnett DK, et al., 2019) is: 5.5%   Values used to calculate the score:     Age: 49 years     Sex: Female     Is Non-Hispanic African American: No     Diabetic: No     Tobacco smoker: No     Systolic Blood Pressure: 155 mmHg     Is BP treated: Yes     HDL Cholesterol: 45 mg/dL     Total Cholesterol: 247 mg/dL

## 2023-12-09 ENCOUNTER — Encounter: Payer: Self-pay | Admitting: Physician Assistant

## 2023-12-09 ENCOUNTER — Telehealth: Payer: BC Managed Care – PPO | Admitting: Physician Assistant

## 2023-12-09 DIAGNOSIS — B3731 Acute candidiasis of vulva and vagina: Secondary | ICD-10-CM | POA: Diagnosis not present

## 2023-12-09 DIAGNOSIS — R8290 Unspecified abnormal findings in urine: Secondary | ICD-10-CM | POA: Insufficient documentation

## 2023-12-09 MED ORDER — FLUCONAZOLE 150 MG PO TABS: 150.0000 mg | ORAL_TABLET | Freq: Every day | ORAL | 0 refills | Status: DC

## 2023-12-09 NOTE — Progress Notes (Signed)
No abnormal bacteria found in urine.

## 2023-12-09 NOTE — Progress Notes (Signed)
 I have spent 5 minutes in review of e-visit questionnaire, review and updating patient chart, medical decision making and response to patient.   Piedad Climes, PA-C

## 2023-12-09 NOTE — Progress Notes (Signed)

## 2023-12-10 MED ORDER — NYSTATIN 100000 UNIT/GM EX OINT: 1.0000 | TOPICAL_OINTMENT | Freq: Two times a day (BID) | CUTANEOUS | 0 refills | Status: DC

## 2023-12-16 DIAGNOSIS — E11649 Type 2 diabetes mellitus with hypoglycemia without coma: Secondary | ICD-10-CM | POA: Insufficient documentation

## 2023-12-16 LAB — POCT GLYCOSYLATED HEMOGLOBIN (HGB A1C): Hemoglobin A1C: 7.6 % — AB (ref 4.0–5.6)

## 2023-12-16 MED ORDER — MOUNJARO 2.5 MG/0.5ML ~~LOC~~ SOAJ: 2.5000 mg | SUBCUTANEOUS | 0 refills | Status: DC

## 2023-12-16 NOTE — Addendum Note (Signed)
 Addended by: Ernest Mallick A on: 12/16/2023 03:34 PM   Modules accepted: Orders

## 2023-12-16 NOTE — Addendum Note (Signed)
 Addended by: Jomarie Longs on: 12/16/2023 03:33 PM   Modules accepted: Orders

## 2023-12-16 NOTE — Progress Notes (Addendum)
 This should get medication approved. A1Cc7.6 for mounjaro.   .. Lab Results  Component Value Date   HGBA1C 7.6 (A) 12/16/2023   Start mounjaro. Follow up in one month to discuss DM.

## 2023-12-19 ENCOUNTER — Encounter: Payer: Self-pay | Admitting: Physician Assistant

## 2023-12-23 DIAGNOSIS — F4323 Adjustment disorder with mixed anxiety and depressed mood: Secondary | ICD-10-CM | POA: Diagnosis not present

## 2024-01-02 ENCOUNTER — Encounter: Payer: Self-pay | Admitting: Physician Assistant

## 2024-01-02 ENCOUNTER — Telehealth: Payer: Self-pay

## 2024-01-02 ENCOUNTER — Other Ambulatory Visit: Payer: Self-pay | Admitting: Physician Assistant

## 2024-01-02 ENCOUNTER — Ambulatory Visit (INDEPENDENT_AMBULATORY_CARE_PROVIDER_SITE_OTHER): Payer: BC Managed Care – PPO | Admitting: Physician Assistant

## 2024-01-02 VITALS — BP 132/90 | HR 80 | Ht 60.0 in | Wt 209.0 lb

## 2024-01-02 DIAGNOSIS — E11649 Type 2 diabetes mellitus with hypoglycemia without coma: Secondary | ICD-10-CM | POA: Diagnosis not present

## 2024-01-02 DIAGNOSIS — R3121 Asymptomatic microscopic hematuria: Secondary | ICD-10-CM | POA: Diagnosis not present

## 2024-01-02 DIAGNOSIS — I10 Essential (primary) hypertension: Secondary | ICD-10-CM | POA: Insufficient documentation

## 2024-01-02 DIAGNOSIS — E785 Hyperlipidemia, unspecified: Secondary | ICD-10-CM | POA: Diagnosis not present

## 2024-01-02 DIAGNOSIS — Z23 Encounter for immunization: Secondary | ICD-10-CM | POA: Diagnosis not present

## 2024-01-02 DIAGNOSIS — E782 Mixed hyperlipidemia: Secondary | ICD-10-CM

## 2024-01-02 LAB — POCT URINALYSIS DIP (CLINITEK)
Glucose, UA: NEGATIVE mg/dL
Leukocytes, UA: NEGATIVE
Nitrite, UA: NEGATIVE
POC PROTEIN,UA: NEGATIVE
Spec Grav, UA: 1.02 (ref 1.010–1.025)
Urobilinogen, UA: 0.2 U/dL
pH, UA: 5.5 (ref 5.0–8.0)

## 2024-01-02 NOTE — Progress Notes (Signed)
Trace hematuria present. Will refer to urology.

## 2024-01-02 NOTE — Patient Instructions (Signed)

## 2024-01-02 NOTE — Progress Notes (Signed)
Established Patient Office Visit  Subjective   Patient ID: Brittany Byrd, female    DOB: 04/14/1971  Age: 53 y.o. MRN: [id number]  Chief Complaint  Patient presents with   Medical Management of Chronic Issues    4 week fup d/m  pt states she has not started medication yet due to prior authorization needed,pt report dietary changes and weight loss ,diet consit of high protien low carb,    HPI  Pt is a 53 yo female coming in to the office today after a new diagnosis of T2DM. Her A1c was 7.6. Pt is looking to start Mounjaro to aid in weight loss and maintenance of her blood sugars. She states that she is now eating a high protein, low carb diet and exercising using her Rebound. In the last month she has lost 5 lbs. She is now monitoring her sugars at home and she ranges from the 110s to the high 170s, with an average around 140. She is complaining of some increased constipation with this new diet and states she uses her linzess sometimes to help.   She denies CP, SOB, palpitations, dizziness, headaches or vision changes.  Review of Systems  Gastrointestinal:  Positive for constipation.  All other systems reviewed and are negative.   Active Ambulatory Problems    Diagnosis Date Noted   Hypertension    Seasonal allergies    Migraine    Asthma, allergic 11/18/2012   Epicondylitis elbow, medial 11/04/2014   Patellofemoral syndrome 11/04/2014   Class 2 obesity due to excess calories without serious comorbidity with body mass index (BMI) of 39.0 to 39.9 in adult 03/11/2015   Neuropathy 08/28/2016   Tinnitus of right ear 08/28/2016   PMDD (premenstrual dysphoric disorder) 01/21/2017   Constipation 04/18/2017   BMI 35.0-35.9,adult 04/20/2017   Heart murmur 04/20/2017   Uterine fibroid 01/05/2019   Ovarian cyst 01/05/2019   Ketonuria 10/04/2019   Isolated proteinuria with morphologic lesion 10/04/2019   Bilirubinuria 10/04/2019   Elevated LDL cholesterol level 10/05/2019   Left flank  pain 10/05/2019   Left lower quadrant guarding 10/05/2019   Recurrent major depressive disorder (HCC) 12/15/2020   Hyperlipidemia 10/30/2021   Elevated fasting glucose 10/30/2021   Panic attacks 12/13/2022   GAD (generalized anxiety disorder) 12/13/2022   Class 3 severe obesity due to excess calories without serious comorbidity with body mass index (BMI) of 40.0 to 44.9 in adult [facility]) 02/25/2023   Primary osteoarthritis of right knee 02/25/2023   Irritable bowel syndrome with constipation 06/04/2023   Post-menopausal 06/04/2023   Acute cystitis with hematuria 08/07/2023   Antibiotic-induced yeast infection 08/07/2023   Other microscopic hematuria 12/05/2023   Abnormal urine odor 12/09/2023   Uncontrolled type 2 diabetes mellitus with hypoglycemia (HCC) 12/16/2023   Essential hypertension with goal blood pressure less than 130/80 01/02/2024   Hyperlipidemia LDL goal <70 01/02/2024   Asymptomatic microscopic hematuria 01/02/2024   Resolved Ambulatory Problems    Diagnosis Date Noted   No Resolved Ambulatory Problems   Past Medical History:  Diagnosis Date   Uterine cancer (HCC)       Objective:     BP (!) 132/90   Pulse 80   Ht 5' (1.524 m)   Wt 209 lb (94.8 kg)   SpO2 99%   BMI 40.82 kg/m   BP Readings from Last 3 Encounters:  01/02/24 (!) 132/90  12/05/23 (!) 155/91  09/23/23 (!) 158/89   Wt Readings from Last 3 Encounters:  01/02/24 209 lb (  94.8 kg)  12/05/23 214 lb (97.1 kg)  09/23/23 205 lb 1.9 oz (93 kg)      Physical Exam Constitutional:      Appearance: Normal appearance. She is obese.  HENT:     Head: Normocephalic.  Cardiovascular:     Rate and Rhythm: Normal rate and regular rhythm.     Heart sounds: Normal heart sounds.  Pulmonary:     Effort: Pulmonary effort is normal.     Breath sounds: Normal breath sounds.  Neurological:     General: No focal deficit present.     Mental Status: She is alert and oriented to person, place, and time.   Psychiatric:        Mood and Affect: Mood normal.     Results for orders placed or performed in visit on 01/02/24  POCT URINALYSIS DIP (CLINITEK)  Result Value Ref Range   Color, UA yellow yellow   Clarity, UA clear clear   Glucose, UA negative negative mg/dL   Bilirubin, UA small (A) negative   Ketones, POC UA large (80) (A) negative mg/dL   Spec Grav, UA 4.010 2.725 - 1.025   Blood, UA trace-lysed (A) negative   pH, UA 5.5 5.0 - 8.0   POC PROTEIN,UA negative negative, trace   Urobilinogen, UA 0.2 0.2 or 1.0 E.U./dL   Nitrite, UA Negative Negative   Leukocytes, UA Negative Negative     The 10-year ASCVD risk score (Arnett DK, et al., 2019) is: 7.6%    Assessment & Plan:   Teigen was seen today for medical management of chronic issues.  Diagnoses and all orders for this visit:  Uncontrolled type 2 diabetes mellitus with hypoglycemia without coma (HCC)  Immunization due -     Pneumococcal conjugate vaccine 20-valent (Prevnar 20)  Mixed hyperlipidemia  Hyperlipidemia LDL goal <70  Essential hypertension with goal blood pressure less than 130/80  Asymptomatic microscopic hematuria -     POCT URINALYSIS DIP (CLINITEK) -     Urine Culture   Pneumonia vaccine given today POCT UA repeated d/t microscopic hematuria on last visit; trace blood noted Urine culture sent Urology referral placed  Goal A1c of < 7.0 Fasting blood sugar goal of 90-130 New LDL goal of < 70 New BP goal of < 130/80 Will start mounjaro 2.5mg  weekly BP not quite to goal but hoping with weight loss and diet changes BP will adjust  Continue with daily exercise and well-balanced meals with a goal of 80 g of protein daily. Increase water and fiber intake to help with constipation Use Miralax or Linzess for constipation  Eye exam UTD Referral sent to [facility] for baseline diabetic foot exam Waiting for prior authorization for [facility] Urology consult for microscopic  hematuria   Follow up in 3 months (around 04/01/2024)   Tandy Gaw, PA-C

## 2024-01-02 NOTE — Telephone Encounter (Addendum)
Initiated Prior authorization HYQ:MVHQIONG 2.5MG /0.5ML auto-injectors Via: Covermymeds Case/Key:[id number] Status: approved as of 01/02/24  Reason:Approved. Please note: The allowed amount is 2 mL (4 pens) per 180 days. This medication is to be increased in strength every 30 days, as tolerated by the member, to a maintenance dose of up to 15mg /0.62mL (4 pens per 28 days). The program limit of 4 pens per 180 days of the 2.5mg /0.15mL strength allows for this dose increase. Authorization Expiration Date: 01/01/2025 Notified Pt via: Mychart

## 2024-01-06 ENCOUNTER — Other Ambulatory Visit: Payer: Self-pay | Admitting: Physician Assistant

## 2024-01-06 DIAGNOSIS — R3121 Asymptomatic microscopic hematuria: Secondary | ICD-10-CM | POA: Diagnosis not present

## 2024-01-06 LAB — URINE CULTURE

## 2024-01-06 MED ORDER — NITROFURANTOIN MONOHYD MACRO 100 MG PO CAPS: 100.0000 mg | ORAL_CAPSULE | Freq: Two times a day (BID) | ORAL | 0 refills | Status: DC

## 2024-01-06 NOTE — Progress Notes (Signed)
E.coli in urine culture. I will send antibiotic to start.

## 2024-01-08 ENCOUNTER — Other Ambulatory Visit: Payer: Self-pay | Admitting: Physician Assistant

## 2024-01-08 DIAGNOSIS — Z1231 Encounter for screening mammogram for malignant neoplasm of breast: Secondary | ICD-10-CM

## 2024-01-09 ENCOUNTER — Ambulatory Visit (INDEPENDENT_AMBULATORY_CARE_PROVIDER_SITE_OTHER): Payer: BC Managed Care – PPO | Admitting: Podiatry

## 2024-01-09 ENCOUNTER — Encounter: Payer: Self-pay | Admitting: Podiatry

## 2024-01-09 DIAGNOSIS — E11649 Type 2 diabetes mellitus with hypoglycemia without coma: Secondary | ICD-10-CM | POA: Diagnosis not present

## 2024-01-09 DIAGNOSIS — B353 Tinea pedis: Secondary | ICD-10-CM

## 2024-01-09 MED ORDER — KETOCONAZOLE 2 % EX CREA: 1.0000 | TOPICAL_CREAM | Freq: Every day | CUTANEOUS | 2 refills | Status: AC

## 2024-01-09 NOTE — Progress Notes (Signed)
  Subjective:  Patient ID: Brittany Byrd, female    DOB: 1971/03/14,   MRN: [id number]  Chief Complaint  Patient presents with   Nail Problem    [facility]    53 y.o. female presents for diabetic foot check. Relates some itching on her heel on the left. Also relates some itching around her great toe.  Denies burning and tingling in their feet. Patient is diabetic and last A1c was  Lab Results  Component Value Date   HGBA1C 7.6 (A) 12/16/2023   .   PCP:  Antoniette Vermell LITTIE, PA-C    . Denies any other pedal complaints. Denies n/v/f/c.   Past Medical History:  Diagnosis Date   Hypertension    Migraine    Seasonal allergies    Uterine cancer (HCC)     Objective:  Physical Exam: Vascular: DP/PT pulses 2/4 bilateral. CFT <3 seconds. Normal hair growth on digits. No edema.  Skin. No lacerations or abrasions bilateral feet. Nails 1-5 normal in appearance. Some scaling noted around the heel area.  Musculoskeletal: MMT 5/5 bilateral lower extremities in DF, PF, Inversion and Eversion. Deceased ROM in DF of ankle joint.  Neurological: Sensation intact to light touch.   Assessment:   1. Uncontrolled type 2 diabetes mellitus with hypoglycemia without coma (HCC)   2. Tinea pedis of left foot      Plan:  Patient was evaluated and treated and all questions answered. -Discussed and educated patient on diabetic foot care, especially with  regards to the vascular, neurological and musculoskeletal systems.  -Stressed the importance of good glycemic control and the detriment of not  controlling glucose levels in relation to the foot. -Discussed supportive shoes at all times and checking feet regularly.  -Answered all patient questions -Ketoconazole  sent to [facility] fo [facility]   -Patient to return  in 1 year for DM foot check.  -Patient advised to call the office if any problems or questions arise in the meantime.   Asberry Failing, DPM

## 2024-01-21 DIAGNOSIS — Z1231 Encounter for screening mammogram for malignant neoplasm of breast: Secondary | ICD-10-CM

## 2024-01-22 ENCOUNTER — Encounter: Payer: Self-pay | Admitting: Physician Assistant

## 2024-01-22 DIAGNOSIS — F4323 Adjustment disorder with mixed anxiety and depressed mood: Secondary | ICD-10-CM | POA: Diagnosis not present

## 2024-01-22 DIAGNOSIS — T887XXA Unspecified adverse effect of drug or medicament, initial encounter: Secondary | ICD-10-CM

## 2024-01-23 ENCOUNTER — Other Ambulatory Visit: Payer: Self-pay | Admitting: Physician Assistant

## 2024-01-23 DIAGNOSIS — F410 Panic disorder [episodic paroxysmal anxiety] without agoraphobia: Secondary | ICD-10-CM

## 2024-01-23 MED ORDER — TIRZEPATIDE 5 MG/0.5ML ~~LOC~~ SOAJ: 5.0000 mg | SUBCUTANEOUS | 0 refills | Status: DC

## 2024-01-28 ENCOUNTER — Ambulatory Visit: Payer: BC Managed Care – PPO

## 2024-01-28 DIAGNOSIS — Z1231 Encounter for screening mammogram for malignant neoplasm of breast: Secondary | ICD-10-CM | POA: Diagnosis not present

## 2024-01-30 ENCOUNTER — Encounter: Payer: Self-pay | Admitting: Physician Assistant

## 2024-01-30 NOTE — Progress Notes (Signed)
 Normal mammogram. Follow up in 1 year.

## 2024-02-13 MED ORDER — ONDANSETRON 8 MG PO TBDP: 8.0000 mg | ORAL_TABLET | Freq: Three times a day (TID) | ORAL | 1 refills | Status: AC | PRN

## 2024-02-13 NOTE — Addendum Note (Signed)
 Addended by: Chalmers Cater on: 02/13/2024 03:52 PM   Modules accepted: Orders

## 2024-02-18 ENCOUNTER — Other Ambulatory Visit: Payer: Self-pay | Admitting: Physician Assistant

## 2024-02-18 DIAGNOSIS — M1711 Unilateral primary osteoarthritis, right knee: Secondary | ICD-10-CM

## 2024-02-18 DIAGNOSIS — M25561 Pain in right knee: Secondary | ICD-10-CM

## 2024-02-18 DIAGNOSIS — K580 Irritable bowel syndrome with diarrhea: Secondary | ICD-10-CM

## 2024-02-24 ENCOUNTER — Other Ambulatory Visit: Payer: Self-pay | Admitting: Physician Assistant

## 2024-02-24 DIAGNOSIS — I10 Essential (primary) hypertension: Secondary | ICD-10-CM

## 2024-03-02 DIAGNOSIS — F4323 Adjustment disorder with mixed anxiety and depressed mood: Secondary | ICD-10-CM | POA: Diagnosis not present

## 2024-03-23 ENCOUNTER — Encounter: Payer: Self-pay | Admitting: Physician Assistant

## 2024-03-29 NOTE — Telephone Encounter (Signed)
 This request has been handled. PA for Mounjaro 2.5 mg was approved from 01/02/24 through 01/01/2025. Patient was notified at time of the approval.

## 2024-03-31 ENCOUNTER — Ambulatory Visit: Payer: BC Managed Care – PPO | Admitting: Physician Assistant

## 2024-03-31 NOTE — Telephone Encounter (Signed)
 This request has been handled. No further action is required. Please review other telephone encounters for additional information.

## 2024-04-01 ENCOUNTER — Other Ambulatory Visit: Payer: Self-pay | Admitting: Physician Assistant

## 2024-04-01 DIAGNOSIS — I10 Essential (primary) hypertension: Secondary | ICD-10-CM

## 2024-04-07 ENCOUNTER — Encounter: Payer: Self-pay | Admitting: Physician Assistant

## 2024-04-07 ENCOUNTER — Ambulatory Visit (INDEPENDENT_AMBULATORY_CARE_PROVIDER_SITE_OTHER): Admitting: Physician Assistant

## 2024-04-07 VITALS — BP 122/80 | HR 91 | Ht 60.0 in | Wt 197.0 lb

## 2024-04-07 DIAGNOSIS — I10 Essential (primary) hypertension: Secondary | ICD-10-CM | POA: Diagnosis not present

## 2024-04-07 DIAGNOSIS — E785 Hyperlipidemia, unspecified: Secondary | ICD-10-CM | POA: Diagnosis not present

## 2024-04-07 DIAGNOSIS — E11649 Type 2 diabetes mellitus with hypoglycemia without coma: Secondary | ICD-10-CM

## 2024-04-07 DIAGNOSIS — E1169 Type 2 diabetes mellitus with other specified complication: Secondary | ICD-10-CM

## 2024-04-07 DIAGNOSIS — F4321 Adjustment disorder with depressed mood: Secondary | ICD-10-CM

## 2024-04-07 DIAGNOSIS — L304 Erythema intertrigo: Secondary | ICD-10-CM

## 2024-04-07 DIAGNOSIS — E66812 Obesity, class 2: Secondary | ICD-10-CM

## 2024-04-07 DIAGNOSIS — Z6838 Body mass index (BMI) 38.0-38.9, adult: Secondary | ICD-10-CM

## 2024-04-07 LAB — POCT GLYCOSYLATED HEMOGLOBIN (HGB A1C): Hemoglobin A1C: 6 % — AB (ref 4.0–5.6)

## 2024-04-07 MED ORDER — FLUCONAZOLE 150 MG PO TABS: ORAL_TABLET | ORAL | 0 refills | Status: DC

## 2024-04-07 MED ORDER — NYSTATIN-TRIAMCINOLONE 100000-0.1 UNIT/GM-% EX OINT
1.0000 | TOPICAL_OINTMENT | Freq: Two times a day (BID) | CUTANEOUS | 0 refills | Status: DC
Start: 2024-04-07 — End: 2024-04-12

## 2024-04-07 MED ORDER — TIRZEPATIDE 7.5 MG/0.5ML ~~LOC~~ SOAJ: 7.5000 mg | SUBCUTANEOUS | 0 refills | Status: DC

## 2024-04-07 MED ORDER — FLUCONAZOLE 150 MG PO TABS: 150.0000 mg | ORAL_TABLET | Freq: Once | ORAL | 0 refills | Status: DC

## 2024-04-07 NOTE — Progress Notes (Signed)
 Established Patient Office Visit  Subjective   Patient ID: Brittany Byrd, female    DOB: July 12, 1971  Age: 53 y.o. MRN: [id number]  Chief Complaint  Patient presents with   Medical Management of Chronic Issues    3 mo fup on d/m  last A1c was 7.6    HPI Pt is a 53 yo obese female with T2DM, IBS-C, GAD, HTN who presents to the clinic for 3 month follow up.   Pt started mounjaro and doing GREAT. She denies any side effects. She has lost 12lbs. She had been moving more but recently lost her mother and had a lot going on. She is doing ok. She has a great perspective on death and loss. She is not checking her sugars. She denies any CP, palpitations, headaches or vision changes. She is not checking her BP but she is taking medication daily.   She has had a rash in her buttocks crease for the last week. It is tender and itchy. She has been putting diaper rash cream on it and ketoconazole  given one time. She took one diflucan  and seemed to help a lot but rash is still there.       ROS See HPI.    Objective:     BP 122/80   Pulse 91   Ht 5' (1.524 m)   Wt 197 lb (89.4 kg)   LMP 01/27/2019   SpO2 99%   BMI 38.47 kg/m  BP Readings from Last 3 Encounters:  04/07/24 122/80  01/02/24 (!) 132/90  12/05/23 (!) 155/91   Wt Readings from Last 3 Encounters:  04/07/24 197 lb (89.4 kg)  01/02/24 209 lb (94.8 kg)  12/05/23 214 lb (97.1 kg)    .Brittany Byrd Results for orders placed or performed in visit on 04/07/24  POCT HgB A1C   Collection Time: 04/07/24  7:56 AM  Result Value Ref Range   Hemoglobin A1C 6.0 (A) 4.0 - 5.6 %   HbA1c POC (<> result, manual entry)     HbA1c, POC (prediabetic range)     HbA1c, POC (controlled diabetic range)       Physical Exam Constitutional:      Appearance: Normal appearance. She is obese.  HENT:     Head: Normocephalic.  Cardiovascular:     Rate and Rhythm: Normal rate and regular rhythm.     Heart sounds: Murmur heard.  Pulmonary:     Effort:  Pulmonary effort is normal.     Breath sounds: Normal breath sounds.  Genitourinary:    Comments: Moist skin in the gluteal crease with some macular erythema and cracked skin. About 3cm by 3cm.  Musculoskeletal:     Cervical back: Normal range of motion and neck supple.     Right lower leg: No edema.     Left lower leg: No edema.  Neurological:     General: No focal deficit present.     Mental Status: She is alert and oriented to person, place, and time.  Psychiatric:        Mood and Affect: Mood normal.       The 10-year ASCVD risk score (Arnett DK, et al., 2019) is: 6.5%    Assessment & Plan:  Brittany AasAaron AasDeanna was seen today for medical management of chronic issues.  Diagnoses and all orders for this visit:  Type 2 diabetes mellitus with other specified complication, without long-term current use of insulin  (HCC) -     POCT HgB A1C -  tirzepatide (MOUNJARO) 7.5 MG/0.5ML Pen; Inject 7.5 mg into the skin once a week.  Essential hypertension with goal blood pressure less than 130/80  Hyperlipidemia LDL goal <70  Grief  Intertrigo -     nystatin -triamcinolone  ointment (MYCOLOG); Apply 1 Application topically 2 (two) times daily. -     fluconazole  (DIFLUCAN ) 150 MG tablet; Repeat in 48 to 72 hours as needed.  Class 2 severe obesity due to excess calories with serious comorbidity and body mass index (BMI) of 38.0 to 38.9 in adult (HCC) -     tirzepatide (MOUNJARO) 7.5 MG/0.5ML Pen; Inject 7.5 mg into the skin once a week.  Other orders -     Discontinue: fluconazole  (DIFLUCAN ) 150 MG tablet; Take 1 tablet (150 mg total) by mouth once for 1 dose.   A1C to goal and down from 7.6 at last visit Continue mounjaro for diabetes, dose increased to help with weight loss and to get superior A1C control Continue DM diet and try to get 150 minutes of exercise a week BP to goal, on ARB Not on statin at this time Needs eye exam Foot exam UTD Declined vaccines today, needs shingles  vaccine Follow up in 3 months  Keep buttocks dry Use mycolog bid and take diflucan  Follow up as needed  Discussed losing mother and cycles or grief  On zoloft  and has xanax  as needed Follow up as needed  Return in about 3 months (around 07/08/2024).    Jeanny Rymer, PA-C

## 2024-04-07 NOTE — Patient Instructions (Signed)
 Diflucan  sent with nystatin /triamcinolone  cream Mounjaro 7.5mg  sent to pharmacy

## 2024-04-09 ENCOUNTER — Encounter: Payer: Self-pay | Admitting: Physician Assistant

## 2024-04-09 ENCOUNTER — Telehealth: Payer: Self-pay

## 2024-04-09 ENCOUNTER — Other Ambulatory Visit: Payer: Self-pay | Admitting: Physician Assistant

## 2024-04-09 ENCOUNTER — Other Ambulatory Visit (HOSPITAL_COMMUNITY): Payer: Self-pay

## 2024-04-09 DIAGNOSIS — L304 Erythema intertrigo: Secondary | ICD-10-CM | POA: Insufficient documentation

## 2024-04-09 DIAGNOSIS — F4321 Adjustment disorder with depressed mood: Secondary | ICD-10-CM | POA: Insufficient documentation

## 2024-04-09 NOTE — Telephone Encounter (Signed)
 Pharmacy Patient Advocate Encounter   Received notification from CoverMyMeds that prior authorization for Nystatin -Triamcinolone  100000-0.1UNIT/GM-% ointment is required/requested.   Insurance verification completed.   The patient is insured through [facility] .   Per test claim: PA required; PA submitted to above mentioned insurance via CoverMyMeds Key/confirmation #/EOC [id number] Status is pending

## 2024-04-09 NOTE — Telephone Encounter (Signed)
 Pharmacy Patient Advocate Encounter  Received notification from [facility] that Prior Authorization for  Nystatin -Triamcinolone  100000-0.1UNIT/GM-% ointment  has been DENIED.  Full denial letter will be uploaded to the media tab. See denial reason below.   PA #/Case ID/Reference #: [id number]

## 2024-04-12 NOTE — Telephone Encounter (Signed)
 Prior auth for Nystatin -Triamcinolone  ointment denied by the insurance. Per the insurance, patient must have tried Nystatin  cream/ointment and / or Triamcinolone  cream/lotion/ointment. Patient has been notified of the outcome via MyChart message.

## 2024-04-14 MED ORDER — NYSTATIN 100000 UNIT/GM EX OINT: 1.0000 | TOPICAL_OINTMENT | Freq: Two times a day (BID) | CUTANEOUS | 0 refills | Status: AC

## 2024-04-14 NOTE — Addendum Note (Signed)
 Addended by: Araceli Knight on: 04/14/2024 05:02 PM   Modules accepted: Orders

## 2024-04-14 NOTE — Telephone Encounter (Signed)
 Sent nystatin  ointment to pharmacy

## 2024-05-10 ENCOUNTER — Encounter: Payer: Self-pay | Admitting: Physician Assistant

## 2024-05-10 DIAGNOSIS — E1169 Type 2 diabetes mellitus with other specified complication: Secondary | ICD-10-CM

## 2024-05-10 DIAGNOSIS — E66812 Obesity, class 2: Secondary | ICD-10-CM

## 2024-05-10 MED ORDER — TIRZEPATIDE 7.5 MG/0.5ML ~~LOC~~ SOAJ: 7.5000 mg | SUBCUTANEOUS | 0 refills | Status: DC

## 2024-05-10 NOTE — Telephone Encounter (Signed)
 Requesting continuation of Mounjaro 7.5mg  Last written 04/07/2024 Last OV 04/07/2024 Upcoming appt 07/09/2024

## 2024-05-13 ENCOUNTER — Other Ambulatory Visit (HOSPITAL_COMMUNITY): Payer: Self-pay

## 2024-05-13 ENCOUNTER — Telehealth: Payer: Self-pay

## 2024-05-13 NOTE — Telephone Encounter (Signed)
 Verified ins for pa for nystatin -triamcinolone  oint. Medication was changed.

## 2024-05-17 DIAGNOSIS — F4323 Adjustment disorder with mixed anxiety and depressed mood: Secondary | ICD-10-CM | POA: Diagnosis not present

## 2024-05-22 ENCOUNTER — Other Ambulatory Visit: Payer: Self-pay | Admitting: Physician Assistant

## 2024-05-22 DIAGNOSIS — I10 Essential (primary) hypertension: Secondary | ICD-10-CM

## 2024-05-29 ENCOUNTER — Other Ambulatory Visit: Payer: Self-pay | Admitting: Physician Assistant

## 2024-05-29 DIAGNOSIS — I10 Essential (primary) hypertension: Secondary | ICD-10-CM

## 2024-06-17 DIAGNOSIS — F4323 Adjustment disorder with mixed anxiety and depressed mood: Secondary | ICD-10-CM | POA: Diagnosis not present

## 2024-06-22 ENCOUNTER — Encounter: Payer: Self-pay | Admitting: Physician Assistant

## 2024-06-22 ENCOUNTER — Other Ambulatory Visit (HOSPITAL_COMMUNITY): Payer: Self-pay

## 2024-06-22 ENCOUNTER — Other Ambulatory Visit: Payer: Self-pay

## 2024-06-22 ENCOUNTER — Ambulatory Visit (INDEPENDENT_AMBULATORY_CARE_PROVIDER_SITE_OTHER): Admitting: Physician Assistant

## 2024-06-22 VITALS — BP 143/92 | HR 97 | Ht 60.0 in | Wt 192.0 lb

## 2024-06-22 DIAGNOSIS — K581 Irritable bowel syndrome with constipation: Secondary | ICD-10-CM | POA: Diagnosis not present

## 2024-06-22 DIAGNOSIS — F411 Generalized anxiety disorder: Secondary | ICD-10-CM | POA: Diagnosis not present

## 2024-06-22 DIAGNOSIS — F339 Major depressive disorder, recurrent, unspecified: Secondary | ICD-10-CM

## 2024-06-22 DIAGNOSIS — Z6837 Body mass index (BMI) 37.0-37.9, adult: Secondary | ICD-10-CM

## 2024-06-22 DIAGNOSIS — K580 Irritable bowel syndrome with diarrhea: Secondary | ICD-10-CM | POA: Insufficient documentation

## 2024-06-22 DIAGNOSIS — F902 Attention-deficit hyperactivity disorder, combined type: Secondary | ICD-10-CM | POA: Insufficient documentation

## 2024-06-22 DIAGNOSIS — I10 Essential (primary) hypertension: Secondary | ICD-10-CM

## 2024-06-22 DIAGNOSIS — E11649 Type 2 diabetes mellitus with hypoglycemia without coma: Secondary | ICD-10-CM

## 2024-06-22 DIAGNOSIS — M1711 Unilateral primary osteoarthritis, right knee: Secondary | ICD-10-CM

## 2024-06-22 DIAGNOSIS — E66812 Obesity, class 2: Secondary | ICD-10-CM

## 2024-06-22 MED ORDER — HYDROCHLOROTHIAZIDE 12.5 MG PO TABS
12.5000 mg | ORAL_TABLET | Freq: Every day | ORAL | 1 refills | Status: AC
  Filled 2024-06-22: qty 90, 90d supply, fill #0
  Filled 2024-09-20: qty 90, 90d supply, fill #1

## 2024-06-22 MED ORDER — TIRZEPATIDE 10 MG/0.5ML ~~LOC~~ SOAJ
10.0000 mg | SUBCUTANEOUS | 0 refills | Status: DC
  Filled 2024-06-22 – 2024-07-27 (×2): qty 2, 28d supply, fill #0

## 2024-06-22 MED ORDER — LINACLOTIDE 290 MCG PO CAPS
290.0000 ug | ORAL_CAPSULE | Freq: Every day | ORAL | 3 refills | Status: DC
  Filled 2024-06-22: qty 30, 30d supply, fill #0
  Filled 2024-06-22: qty 90, 90d supply, fill #0

## 2024-06-22 MED ORDER — MELOXICAM 15 MG PO TABS
15.0000 mg | ORAL_TABLET | Freq: Every day | ORAL | 0 refills | Status: DC
  Filled 2024-06-22: qty 90, 90d supply, fill #0

## 2024-06-22 MED ORDER — TRAMADOL HCL 50 MG PO TABS
50.0000 mg | ORAL_TABLET | Freq: Four times a day (QID) | ORAL | 0 refills | Status: DC | PRN
  Filled 2024-06-22 – 2024-07-28 (×2): qty 21, 6d supply, fill #0

## 2024-06-22 MED ORDER — DICYCLOMINE HCL 20 MG PO TABS
20.0000 mg | ORAL_TABLET | Freq: Three times a day (TID) | ORAL | 2 refills | Status: AC
Start: 2024-06-22 — End: ?
  Filled 2024-06-22: qty 90, 30d supply, fill #0
  Filled 2024-09-20: qty 90, 30d supply, fill #1

## 2024-06-22 MED ORDER — LOSARTAN POTASSIUM 100 MG PO TABS
100.0000 mg | ORAL_TABLET | Freq: Every day | ORAL | 1 refills | Status: AC
  Filled 2024-06-22: qty 90, 90d supply, fill #0
  Filled 2024-09-20: qty 90, 90d supply, fill #1

## 2024-06-22 MED ORDER — AMPHETAMINE-DEXTROAMPHET ER 10 MG PO CP24
10.0000 mg | ORAL_CAPSULE | Freq: Every day | ORAL | 0 refills | Status: DC
  Filled 2024-06-22: qty 30, 30d supply, fill #0

## 2024-06-22 MED ORDER — SERTRALINE HCL 50 MG PO TABS
50.0000 mg | ORAL_TABLET | Freq: Every day | ORAL | 3 refills | Status: DC
  Filled 2024-06-22: qty 90, 90d supply, fill #0

## 2024-06-22 NOTE — Progress Notes (Signed)
 Acute Office Visit  Subjective:     Patient ID: Brittany Byrd, female    DOB: May 26, 1971, 53 y.o.   MRN: [id number]  Chief Complaint  Patient presents with   Leg Pain    Rt leg pain onset  1 week     Patient is in today for acute on chronic right knee pain for the last week. Patient states that she woke up one morning and it was very painful and she has been limping. Her right knee has had issues in the past and patient states she has had knee cortisone and orthovisc injections in that knee before. She has tried to used diclofenac  twice a day to help with the pain with minimal relief. She is concerned because she has a trip this weekend, which requires her to walk a lot. Patient would like a steroid injection in her knee because the last time she had this type of pain in her knee and she received an injection she 'felt like she could dance'. Patient denies any numbness, tingling, or loss feeling in her right lower leg. She states she has had multiple x-rays and an MRI of her knee in the past.   Patient was also diagnosed with ADHD last month and would like a prescription today. She was seen at [facility].  She is currently on mounjaro  7.5mg  weekly. She is noticing she is getting hungry at end of the week. She would like to go up on dose.   She would like all her medications sent to [facility] long outpatient pharmacy. She is switching pharmacies.   Review of Systems  Musculoskeletal:        Right knee pain   All other systems reviewed and are negative.      Objective:    BP (!) 143/92   Pulse 97   Ht 5' (1.524 m)   Wt 192 lb (87.1 kg)   LMP 01/27/2019   SpO2 99%   BMI 37.50 kg/m  BP Readings from Last 3 Encounters:  06/22/24 (!) 143/92  04/07/24 122/80  01/02/24 (!) 132/90     Physical Exam Constitutional:      Appearance: Normal appearance.  Musculoskeletal:        General: Tenderness present. No swelling or deformity. Normal range of motion.      Cervical back: Normal range of motion.     Right lower leg: No edema.     Left lower leg: No edema.     Comments: Tender to palpation in medial popliteal region   Neurological:     Mental Status: She is alert.          Assessment & Plan:  [name] was seen today for leg pain.  Diagnoses and all orders for this visit:  ADHD (attention deficit hyperactivity disorder), combined type  Recurrent major depressive disorder, remission status unspecified (HCC) -     sertraline  (ZOLOFT ) 50 MG tablet; Take 1 tablet (50 mg total) by mouth daily.  GAD (generalized anxiety disorder) -     sertraline  (ZOLOFT ) 50 MG tablet; Take 1 tablet (50 mg total) by mouth daily.  Irritable bowel syndrome with constipation -     linaclotide  (LINZESS ) 290 MCG CAPS capsule; Take 1 capsule (290 mcg total) by mouth daily.  Irritable bowel syndrome with diarrhea -     dicyclomine  (BENTYL ) 20 MG tablet; Take 1 tablet (20 mg total) by mouth 3 (three) times daily before meals.  Primary hypertension -  hydrochlorothiazide  (HYDRODIURIL ) 12.5 MG tablet; Take 1 tablet (12.5 mg total) by mouth daily. -     losartan  (COZAAR ) 100 MG tablet; Take 1 tablet (100 mg total) by mouth daily.  Primary osteoarthritis of right knee  Other orders -     tirzepatide  (MOUNJARO ) 10 MG/0.5ML Pen; Inject 10 mg into the skin once a week. -     amphetamine -dextroamphetamine  (ADDERALL XR) 10 MG 24 hr capsule; Take 1 capsule (10 mg total) by mouth daily. -     meloxicam  (MOBIC ) 15 MG tablet; Take 1 tablet (15 mg total) by mouth daily. -     traMADol  (ULTRAM ) 50 MG tablet; Take 1 tablet (50 mg total) by mouth every 6 (six) hours as needed for up to 5 days.   MRI/Xray shows OA of right knee with full thickness cartilage loss and extrusion of the medial meniscus - Stop diclofenac   - Meloxicam  sent into the pharmacy to try once a day - Sent tramadol  into the pharmacy for break through pain  .SABRAPDMP reviewed during this encounter. No  concerns - Patient is scheduled to see Dr. ONEIDA tomorrow (7/23) for a knee injection  BP not to goal but patient is in pain -BP 150/89; BP recheck 143/92 - Patient taking hydrochlorothiazide , losartan , refills sent into pharmacy   ADHD- combined type - Pt was diagnosed last month, she see Brittany Byrd at [facility] (Coordination of Care note scanned into media file) - Adderall XR 10mg  sent into pharmacy  - Patient following up in August with [facility] Follow up in our office in 3 months  Irritable Bowel Syndrome  - Dicyclomine  and Linzess  refills sent into pharmacy   Anxiety Major Depressive Disorder  - Sertraline  refills sent into pharmacy   T2DM - Pt still taking mounjaro , increase to 10mg   - Weight today: 192lbs (last visit 04/07/2024: 197lbs)   Return in about 3 months (around 09/22/2024).  Brittany Phillis, PA-C

## 2024-06-22 NOTE — Patient Instructions (Signed)
 Stop diclofenac , start mobic .  Start adderall XR 10mg  daily Use tramadol  as needed for breakthrough pain Dr. ONEIDA in the morning for injection

## 2024-06-23 ENCOUNTER — Ambulatory Visit (INDEPENDENT_AMBULATORY_CARE_PROVIDER_SITE_OTHER): Admitting: Sports Medicine

## 2024-06-23 ENCOUNTER — Encounter: Payer: Self-pay | Admitting: Sports Medicine

## 2024-06-23 ENCOUNTER — Other Ambulatory Visit (INDEPENDENT_AMBULATORY_CARE_PROVIDER_SITE_OTHER)

## 2024-06-23 DIAGNOSIS — M1711 Unilateral primary osteoarthritis, right knee: Secondary | ICD-10-CM

## 2024-06-23 MED ORDER — TRIAMCINOLONE ACETONIDE 40 MG/ML IJ SUSP
40.0000 mg | Freq: Once | INTRAMUSCULAR | Status: AC
  Administered 2024-06-23: 40 mg via INTRA_ARTICULAR

## 2024-06-23 NOTE — Addendum Note (Signed)
 Addended by: OLIVA-AVELLANEDA, Canna Nickelson L on: 06/23/2024 09:10 AM   Modules accepted: Orders

## 2024-06-23 NOTE — Assessment & Plan Note (Signed)
 Known knee osteoarthritis, we did Orthovisc in November of last year, now with recurrence of pain, steroid injection today, return to see me as needed.

## 2024-06-23 NOTE — Progress Notes (Signed)
    Procedures performed today:    Procedure: Real-time Ultrasound Guided injection of the right knee Device: Samsung HS60  Verbal informed consent obtained.  Time-out conducted.  Noted no overlying erythema, induration, or other signs of local infection.  Skin prepped in a sterile fashion.  Local anesthesia: Topical Ethyl chloride.  With sterile technique and under real time ultrasound guidance: Mild effusion noted, 1 cc Kenalog  40, 2 cc lidocaine, 2 cc bupivacaine injected easily Completed without difficulty  Advised to call if fevers/chills, erythema, induration, drainage, or persistent bleeding.  Images permanently stored and available for review in PACS.  Impression: Technically successful ultrasound guided injection.  Independent interpretation of notes and tests performed by another provider:   None.  Brief History, Exam, Impression, and Recommendations:    Primary osteoarthritis of right knee Known knee osteoarthritis, we did Orthovisc in November of last year, now with recurrence of pain, steroid injection today, return to see me as needed.    ____________________________________________ Debby PARAS. Curtis, M.D., ABFM., CAQSM., AME. Primary Care and Sports Medicine [facility]  Adjunct Professor of [facility] Medicine  [employer]  Restaurant manager, fast food

## 2024-06-25 ENCOUNTER — Other Ambulatory Visit (HOSPITAL_COMMUNITY): Payer: Self-pay

## 2024-07-09 ENCOUNTER — Ambulatory Visit: Admitting: Physician Assistant

## 2024-07-14 DIAGNOSIS — F902 Attention-deficit hyperactivity disorder, combined type: Secondary | ICD-10-CM | POA: Diagnosis not present

## 2024-07-19 ENCOUNTER — Encounter: Payer: Self-pay | Admitting: Physician Assistant

## 2024-07-20 DIAGNOSIS — F902 Attention-deficit hyperactivity disorder, combined type: Secondary | ICD-10-CM | POA: Diagnosis not present

## 2024-07-21 ENCOUNTER — Ambulatory Visit (INDEPENDENT_AMBULATORY_CARE_PROVIDER_SITE_OTHER): Admitting: Physician Assistant

## 2024-07-21 ENCOUNTER — Encounter: Payer: Self-pay | Admitting: Physician Assistant

## 2024-07-21 ENCOUNTER — Other Ambulatory Visit (HOSPITAL_BASED_OUTPATIENT_CLINIC_OR_DEPARTMENT_OTHER): Payer: Self-pay

## 2024-07-21 VITALS — BP 140/90

## 2024-07-21 DIAGNOSIS — F4323 Adjustment disorder with mixed anxiety and depressed mood: Secondary | ICD-10-CM

## 2024-07-21 DIAGNOSIS — F4325 Adjustment disorder with mixed disturbance of emotions and conduct: Secondary | ICD-10-CM | POA: Insufficient documentation

## 2024-07-21 DIAGNOSIS — F411 Generalized anxiety disorder: Secondary | ICD-10-CM

## 2024-07-21 DIAGNOSIS — F902 Attention-deficit hyperactivity disorder, combined type: Secondary | ICD-10-CM

## 2024-07-21 DIAGNOSIS — Z566 Other physical and mental strain related to work: Secondary | ICD-10-CM | POA: Diagnosis not present

## 2024-07-21 DIAGNOSIS — I10 Essential (primary) hypertension: Secondary | ICD-10-CM

## 2024-07-21 MED ORDER — ALPRAZOLAM 0.5 MG PO TABS
0.5000 mg | ORAL_TABLET | Freq: Two times a day (BID) | ORAL | 0 refills | Status: AC | PRN
  Filled 2024-07-21: qty 20, 10d supply, fill #0

## 2024-07-21 MED ORDER — AMPHETAMINE-DEXTROAMPHET ER 10 MG PO CP24
10.0000 mg | ORAL_CAPSULE | Freq: Every day | ORAL | 0 refills | Status: DC
  Filled 2024-07-21: qty 30, 30d supply, fill #0

## 2024-07-21 MED ORDER — AMPHETAMINE-DEXTROAMPHET ER 10 MG PO CP24: 10.0000 mg | ORAL_CAPSULE | Freq: Every day | ORAL | 0 refills | Status: DC

## 2024-07-21 MED ORDER — SERTRALINE HCL 100 MG PO TABS
100.0000 mg | ORAL_TABLET | Freq: Every day | ORAL | 0 refills | Status: DC
  Filled 2024-07-21: qty 90, 90d supply, fill #0

## 2024-07-21 NOTE — Telephone Encounter (Signed)
 Called patient, LVM to inform that paperwork is ready to pick and is upfront for patient, thanks.

## 2024-07-21 NOTE — Patient Instructions (Signed)
 Will fill out accomodation to work from home for the next 3 weeks.

## 2024-07-21 NOTE — Progress Notes (Signed)
 Established Patient Office Visit  Subjective   Patient ID: Brittany Byrd, female    DOB: 05/12/1971  Age: 53 y.o. MRN: [id number]  Chief Complaint  Patient presents with   Medical Management of Chronic Issues    Anxiety     HPI Pt is a 53 yo female who presents to the clinic to discuss worsening anxiety and stress. 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 She thought she was performing well and in the last 4 weeks her manager and demoted her and be raided her with negative feedback. This has caused her a lot of stress. She is anxious all the time with racing heart beat and having panic attacks. She is not able to focus and no longer feels confident in anything that she does.   She is seeing therapist weekly. She is taking zoloft  50mg  daily and xanax  as needed. She does not feel like she can continue to go into the office right now.   Pt has worked at company for 27 years.   Patient Active Problem List   Diagnosis Date Noted   Stress at work 07/21/2024   Adjustment disorder with mixed disturbance of emotions and conduct 07/21/2024   ADHD (attention deficit hyperactivity disorder), combined type 06/22/2024   Irritable bowel syndrome with diarrhea 06/22/2024   Intertrigo 04/09/2024   Grief 04/09/2024   Essential hypertension with goal blood pressure less than 130/80 01/02/2024   Hyperlipidemia LDL goal <70 01/02/2024   Asymptomatic microscopic hematuria 01/02/2024   Uncontrolled type 2 diabetes mellitus with hypoglycemia (HCC) 12/16/2023   Abnormal urine odor 12/09/2023   Other microscopic hematuria 12/05/2023   Acute cystitis with hematuria 08/07/2023   Antibiotic-induced yeast infection 08/07/2023   Irritable bowel syndrome with constipation 06/04/2023   Post-menopausal 06/04/2023   Class 2 severe obesity due to excess calories with serious comorbidity and body mass index (BMI) of 38.0 to 38.9 in adult (HCC) 02/25/2023   Primary osteoarthritis of right knee  02/25/2023   Panic attacks 12/13/2022   GAD (generalized anxiety disorder) 12/13/2022   Hyperlipidemia 10/30/2021   Elevated fasting glucose 10/30/2021   Recurrent major depressive disorder (HCC) 12/15/2020   Elevated LDL cholesterol level 10/05/2019   Left flank pain 10/05/2019   Left lower quadrant guarding 10/05/2019   Ketonuria 10/04/2019   Isolated proteinuria with morphologic lesion 10/04/2019   Bilirubinuria 10/04/2019   Uterine fibroid 01/05/2019   Ovarian cyst 01/05/2019   BMI 35.0-35.9,adult 04/20/2017   Heart murmur 04/20/2017   Constipation 04/18/2017   PMDD (premenstrual dysphoric disorder) 01/21/2017   Neuropathy 08/28/2016   Tinnitus of right ear 08/28/2016   Class 2 obesity due to excess calories without serious comorbidity with body mass index (BMI) of 39.0 to 39.9 in adult 03/11/2015   Epicondylitis elbow, medial 11/04/2014   Patellofemoral syndrome 11/04/2014   Asthma, allergic 11/18/2012   Hypertension    Seasonal allergies    Migraine    Past Medical History:  Diagnosis Date   Hypertension    Migraine    Seasonal allergies    Uterine cancer (HCC)    Family History  Problem Relation Age of Onset   Depression Mother    Hypertension Mother    Breast cancer Mother 70   Diabetes Father    Hypertension Father    Diabetes Maternal Grandmother    Diabetes Paternal Grandmother    Breast cancer Maternal Aunt    Allergies  Allergen Reactions   Prednisone  Other (See  Comments)    can't sleep, it will keep me up all night but I can tolerate the shot form   Singulair  [Montelukast  Sodium]     depressed      Review of Systems  All other systems reviewed and are negative.     Objective:     BP (!) 140/90   LMP 01/27/2019  BP Readings from Last 3 Encounters:  07/21/24 (!) 140/90  06/22/24 (!) 143/92  04/07/24 122/80   Wt Readings from Last 3 Encounters:  06/22/24 192 lb (87.1 kg)  04/07/24 197 lb (89.4 kg)  01/02/24 209 lb (94.8 kg)     ..    07/21/2024   12:53 PM 01/02/2024    8:18 AM 12/05/2023    8:29 AM 06/04/2023    7:19 AM 05/06/2023    8:33 AM  Depression screen PHQ 2/9  Decreased Interest 2 0 1 1 1   Down, Depressed, Hopeless 2 0 1 1 1   PHQ - 2 Score 4 0 2 2 2   Altered sleeping 2  0 1 1  Tired, decreased energy 3  0 1 1  Change in appetite 2  3 0 0  Feeling bad or failure about yourself  3  1 1 2   Trouble concentrating 3  1 2 2   Moving slowly or fidgety/restless 2  0 0 1  Suicidal thoughts 0  0 0 0  PHQ-9 Score 19  7 7 9   Difficult doing work/chores Somewhat difficult  Not difficult at all Somewhat difficult Not difficult at all   .Brittany Byrd    07/21/2024   12:52 PM 12/05/2023    8:29 AM 06/04/2023    7:20 AM 05/06/2023    8:34 AM  GAD 7 : Generalized Anxiety Score  Nervous, Anxious, on Edge 3 2 1 3   Control/stop worrying 3 2 1 3   Worry too much - different things 3 2 1 3   Trouble relaxing 3 2 1 3   Restless 3 1 1 3   Easily annoyed or irritable 3 2 1 3   Afraid - awful might happen 3 2 1 3   Total GAD 7 Score 21 13 7 21   Anxiety Difficulty Very difficult Somewhat difficult Somewhat difficult Not difficult at all      Physical Exam Constitutional:      Appearance: Normal appearance. She is obese.  HENT:     Head: Normocephalic.  Cardiovascular:     Rate and Rhythm: Normal rate.  Pulmonary:     Effort: Pulmonary effort is normal.  Neurological:     Mental Status: She is alert.  Psychiatric:     Comments: anxious      The 10-year ASCVD risk score (Arnett DK, et al., 2019) is: 8.5%    Assessment & Plan:  [name] was seen today for medical management of chronic issues.  Diagnoses and all orders for this visit:  Stress at work -     sertraline  (ZOLOFT ) 100 MG tablet; Take 1 tablet (100 mg total) by mouth daily.  ADHD (attention deficit hyperactivity disorder), combined type -     amphetamine -dextroamphetamine  (ADDERALL XR) 10 MG 24 hr capsule; Take 1 capsule (10 mg total) by mouth daily. -      amphetamine -dextroamphetamine  (ADDERALL XR) 10 MG 24 hr capsule; Take 1 capsule (10 mg total) by mouth daily.  GAD (generalized anxiety disorder) -     sertraline  (ZOLOFT ) 100 MG tablet; Take 1 tablet (100 mg total) by mouth daily.  Adjustment disorder with mixed anxiety and depressed  mood -     sertraline  (ZOLOFT ) 100 MG tablet; Take 1 tablet (100 mg total) by mouth daily. -     ALPRAZolam  (XANAX ) 0.5 MG tablet; Take 1 tablet (0.5 mg total) by mouth 2 (two) times daily as needed for anxiety. And panic attacks  Primary hypertension   PHQ and GAD not controlled Pt is under a lot of stress with new manager and having more panic attacks Increased zoloft  to 100mg  daily Use xanax  as needed sparingly for anxiety/panic Continue adderall for focus Continue with weekly counseling Wrote for patient a work accomodation to work from home for next 2 weeks BP elevated today but patient is very anxious and stressed  Spent 45 minutes with patient discussing her job and stress management as well as treatment plan and filling out accomodation for work form.    Return in about 2 weeks (around 08/04/2024).    Cyrene Gharibian, PA-C

## 2024-07-27 ENCOUNTER — Other Ambulatory Visit: Payer: Self-pay

## 2024-07-27 ENCOUNTER — Other Ambulatory Visit: Payer: Self-pay | Admitting: Physician Assistant

## 2024-07-27 ENCOUNTER — Other Ambulatory Visit (HOSPITAL_COMMUNITY): Payer: Self-pay

## 2024-07-27 DIAGNOSIS — M1711 Unilateral primary osteoarthritis, right knee: Secondary | ICD-10-CM

## 2024-07-27 MED ORDER — MELOXICAM 15 MG PO TABS
15.0000 mg | ORAL_TABLET | Freq: Every day | ORAL | 0 refills | Status: DC
  Filled 2024-07-27: qty 90, 90d supply, fill #0

## 2024-07-28 ENCOUNTER — Other Ambulatory Visit (HOSPITAL_COMMUNITY): Payer: Self-pay

## 2024-07-28 DIAGNOSIS — F902 Attention-deficit hyperactivity disorder, combined type: Secondary | ICD-10-CM | POA: Diagnosis not present

## 2024-08-03 ENCOUNTER — Encounter: Payer: Self-pay | Admitting: Sports Medicine

## 2024-08-04 ENCOUNTER — Ambulatory Visit (INDEPENDENT_AMBULATORY_CARE_PROVIDER_SITE_OTHER): Admitting: Physician Assistant

## 2024-08-04 ENCOUNTER — Other Ambulatory Visit (HOSPITAL_BASED_OUTPATIENT_CLINIC_OR_DEPARTMENT_OTHER): Payer: Self-pay

## 2024-08-04 VITALS — BP 140/80 | HR 84 | Ht 60.0 in

## 2024-08-04 DIAGNOSIS — F411 Generalized anxiety disorder: Secondary | ICD-10-CM | POA: Diagnosis not present

## 2024-08-04 DIAGNOSIS — M1711 Unilateral primary osteoarthritis, right knee: Secondary | ICD-10-CM

## 2024-08-04 DIAGNOSIS — F4323 Adjustment disorder with mixed anxiety and depressed mood: Secondary | ICD-10-CM

## 2024-08-04 DIAGNOSIS — Z566 Other physical and mental strain related to work: Secondary | ICD-10-CM | POA: Diagnosis not present

## 2024-08-04 MED ORDER — TRAMADOL HCL 50 MG PO TABS
50.0000 mg | ORAL_TABLET | Freq: Two times a day (BID) | ORAL | 0 refills | Status: AC | PRN
  Filled 2024-08-04: qty 60, 30d supply, fill #0

## 2024-08-04 MED ORDER — DICLOFENAC SODIUM 75 MG PO TBEC
75.0000 mg | DELAYED_RELEASE_TABLET | Freq: Two times a day (BID) | ORAL | 1 refills | Status: AC
  Filled 2024-08-04 – 2024-09-20 (×3): qty 180, 90d supply, fill #0

## 2024-08-04 MED ORDER — METHYLPREDNISOLONE ACETATE 80 MG/ML IJ SUSP
80.0000 mg | Freq: Once | INTRAMUSCULAR | Status: AC
  Administered 2024-08-04: 80 mg via INTRAMUSCULAR

## 2024-08-04 NOTE — Patient Instructions (Signed)
 Will refer to Dr. Cristy.  Stop mobic , restart diclofenac  twice a day Tramadol  as needed

## 2024-08-04 NOTE — Progress Notes (Unsigned)
   Established Patient Office Visit  Subjective   Patient ID: Brittany Byrd, female    DOB: 01/11/1971  Age: 53 y.o. MRN: [id number]  Chief Complaint  Patient presents with  . Medical Management of Chronic Issues    HPI  {History (Optional):23778}  ROS    Objective:     BP (!) 140/80   Pulse 84   Ht 5' (1.524 m)   LMP 01/27/2019   SpO2 99%   BMI 37.50 kg/m  {Vitals History (Optional):23777}  Physical Exam   No results found for any visits on 08/04/24.  {Labs (Optional):23779}  The 10-year ASCVD risk score (Arnett DK, et al., 2019) is: 9.1%    Assessment & Plan:   Problem List Items Addressed This Visit       Unprioritized   GAD (generalized anxiety disorder)   Stress at work - Primary   Other Visit Diagnoses       Adjustment disorder with mixed anxiety and depressed mood           No follow-ups on file.    Sara Keys, PA-C

## 2024-08-06 ENCOUNTER — Encounter: Payer: Self-pay | Admitting: Physician Assistant

## 2024-08-08 ENCOUNTER — Encounter: Payer: Self-pay | Admitting: Physician Assistant

## 2024-08-09 NOTE — Telephone Encounter (Signed)
 Yes for her right knee osteoarthritis! Can you start a DMV form and I can fill it out.

## 2024-08-09 NOTE — Telephone Encounter (Signed)
Placed in Jade's basket.  

## 2024-08-11 DIAGNOSIS — F902 Attention-deficit hyperactivity disorder, combined type: Secondary | ICD-10-CM | POA: Diagnosis not present

## 2024-08-16 ENCOUNTER — Other Ambulatory Visit (HOSPITAL_BASED_OUTPATIENT_CLINIC_OR_DEPARTMENT_OTHER): Payer: Self-pay

## 2024-08-26 DIAGNOSIS — F902 Attention-deficit hyperactivity disorder, combined type: Secondary | ICD-10-CM | POA: Diagnosis not present

## 2024-09-20 ENCOUNTER — Encounter: Payer: Self-pay | Admitting: Physician Assistant

## 2024-09-20 ENCOUNTER — Other Ambulatory Visit (HOSPITAL_BASED_OUTPATIENT_CLINIC_OR_DEPARTMENT_OTHER): Payer: Self-pay

## 2024-09-20 ENCOUNTER — Other Ambulatory Visit (HOSPITAL_COMMUNITY): Payer: Self-pay

## 2024-09-20 ENCOUNTER — Other Ambulatory Visit: Payer: Self-pay | Admitting: Physician Assistant

## 2024-09-20 DIAGNOSIS — F4323 Adjustment disorder with mixed anxiety and depressed mood: Secondary | ICD-10-CM

## 2024-09-20 DIAGNOSIS — F411 Generalized anxiety disorder: Secondary | ICD-10-CM

## 2024-09-20 DIAGNOSIS — Z566 Other physical and mental strain related to work: Secondary | ICD-10-CM

## 2024-09-20 DIAGNOSIS — F902 Attention-deficit hyperactivity disorder, combined type: Secondary | ICD-10-CM

## 2024-09-20 DIAGNOSIS — E66812 Obesity, class 2: Secondary | ICD-10-CM

## 2024-09-20 DIAGNOSIS — E11649 Type 2 diabetes mellitus with hypoglycemia without coma: Secondary | ICD-10-CM

## 2024-09-20 MED ORDER — AMPHETAMINE-DEXTROAMPHET ER 10 MG PO CP24
10.0000 mg | ORAL_CAPSULE | Freq: Every day | ORAL | 0 refills | Status: AC
  Filled 2024-09-20: qty 90, 90d supply, fill #0

## 2024-09-21 ENCOUNTER — Other Ambulatory Visit (HOSPITAL_COMMUNITY): Payer: Self-pay

## 2024-09-21 ENCOUNTER — Other Ambulatory Visit: Payer: Self-pay

## 2024-09-22 ENCOUNTER — Other Ambulatory Visit (HOSPITAL_COMMUNITY): Payer: Self-pay

## 2024-09-23 ENCOUNTER — Other Ambulatory Visit (HOSPITAL_COMMUNITY): Payer: Self-pay

## 2024-09-23 ENCOUNTER — Other Ambulatory Visit: Payer: Self-pay

## 2024-09-23 DIAGNOSIS — F902 Attention-deficit hyperactivity disorder, combined type: Secondary | ICD-10-CM | POA: Diagnosis not present

## 2024-09-23 MED ORDER — SERTRALINE HCL 100 MG PO TABS
100.0000 mg | ORAL_TABLET | Freq: Every day | ORAL | 0 refills | Status: AC
  Filled 2024-09-23: qty 90, 90d supply, fill #0

## 2024-09-23 MED ORDER — MOUNJARO 10 MG/0.5ML ~~LOC~~ SOAJ
10.0000 mg | SUBCUTANEOUS | 0 refills | Status: DC
  Filled 2024-09-23: qty 2, 28d supply, fill #0

## 2024-09-24 ENCOUNTER — Other Ambulatory Visit (HOSPITAL_COMMUNITY): Payer: Self-pay

## 2024-10-06 DIAGNOSIS — F902 Attention-deficit hyperactivity disorder, combined type: Secondary | ICD-10-CM | POA: Diagnosis not present

## 2024-10-11 ENCOUNTER — Encounter: Payer: Self-pay | Admitting: Physician Assistant

## 2024-10-12 ENCOUNTER — Ambulatory Visit (INDEPENDENT_AMBULATORY_CARE_PROVIDER_SITE_OTHER): Admitting: Physician Assistant

## 2024-10-12 ENCOUNTER — Other Ambulatory Visit (HOSPITAL_BASED_OUTPATIENT_CLINIC_OR_DEPARTMENT_OTHER): Payer: Self-pay

## 2024-10-12 VITALS — BP 130/80 | HR 70 | Ht 60.0 in | Wt 190.0 lb

## 2024-10-12 DIAGNOSIS — E11649 Type 2 diabetes mellitus with hypoglycemia without coma: Secondary | ICD-10-CM | POA: Diagnosis not present

## 2024-10-12 DIAGNOSIS — Z566 Other physical and mental strain related to work: Secondary | ICD-10-CM

## 2024-10-12 DIAGNOSIS — F411 Generalized anxiety disorder: Secondary | ICD-10-CM

## 2024-10-12 DIAGNOSIS — F4323 Adjustment disorder with mixed anxiety and depressed mood: Secondary | ICD-10-CM

## 2024-10-12 MED ORDER — TIRZEPATIDE 12.5 MG/0.5ML ~~LOC~~ SOAJ
12.5000 mg | SUBCUTANEOUS | 0 refills | Status: AC
  Filled 2024-10-12: qty 6, 84d supply, fill #0

## 2024-10-12 NOTE — Progress Notes (Signed)
 "  Established Patient Office Visit  Subjective   Patient ID: Brittany Byrd, female    DOB: [birth year]  Age: 53 y.o. MRN: [id number]  Chief Complaint  Patient presents with   Medical Management of Chronic Issues    ADA Paperwork and medicine    HPI .Discussed the use of AI scribe software for clinical note transcription with the patient, who gave verbal consent to proceed.  History of Present Illness Brittany Byrd is a 53 year old female who presents with work-related stress and anxiety.  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, perceived as a demotion despite no change in pay - Feelings of being undervalued and gaslighted by production designer, theatre/television/film - Exposure to a toxic work environment with derogatory comments from production designer, theatre/television/film and a hostile atmosphere - Emotional distress manifested by crying at work and feeling second-guessed about abilities - Involvement in HR and ethics investigations regarding manager's behavior, contributing to increased stress - Primarily working from home, only attending office for necessary meetings or presentations - Desire to leave current job due to toxic environment, considering options including potential medical leave - Financially able to leave but currently remaining in position based on husband's advice to wait for a severance package  Psychological symptoms - Emotional distress with frequent crying at work - Persistent anxiety and stress related to work Facilities Manager of anxiety - Zoloft  initiated in August with dosage increase at that time to manage anxiety and stress    ROS See HPI.    Objective:     BP 130/80   Pulse 70   Ht 5' (1.524 m)   Wt 190 lb (86.2 kg)   LMP 01/27/2019   SpO2 99%   BMI 37.11 kg/m  BP Readings from Last 3 Encounters:  10/12/24 130/80  08/04/24 (!) 140/80  07/21/24 (!) 140/90   Wt Readings from  Last 3 Encounters:  10/12/24 190 lb (86.2 kg)  06/22/24 192 lb (87.1 kg)  04/07/24 197 lb (89.4 kg)    .    10/13/2024   11:16 AM 08/04/2024    8:14 AM 07/21/2024   12:53 PM 01/02/2024    8:18 AM 12/05/2023    8:29 AM  Depression screen PHQ 2/9  Decreased Interest 1 1 2  0 1  Down, Depressed, Hopeless 1 1 2  0 1  PHQ - 2 Score 2 2 4  0 2  Altered sleeping 2 2 2   0  Tired, decreased energy 2 3 3   0  Change in appetite 2 2 2  3   Feeling bad or failure about yourself  1 1 3  1   Trouble concentrating 1 1 3  1   Moving slowly or fidgety/restless 1 1 2   0  Suicidal thoughts 0 0 0  0  PHQ-9 Score 11 12  19   7    Difficult doing work/chores Somewhat difficult Somewhat difficult Somewhat difficult  Not difficult at all     Data saved with a previous flowsheet row definition   ..    10/13/2024   11:15 AM 08/04/2024    8:15 AM 07/21/2024   12:52 PM 12/05/2023    8:29 AM  GAD 7 : Generalized Anxiety Score  Nervous, Anxious, on Edge 3 1 3 2   Control/stop worrying 3 1 3 2   Worry too much - different things 3 1 3 2   Trouble relaxing 3 2 3 2   Restless 3  1 3 1   Easily annoyed or irritable 3 1 3 2   Afraid - awful might happen 3 2 3 2   Total GAD 7 Score 21 9 21 13   Anxiety Difficulty Very difficult Somewhat difficult Very difficult Somewhat difficult      Physical Exam Constitutional:      Appearance: Normal appearance. She is obese.  HENT:     Head: Normocephalic.  Cardiovascular:     Rate and Rhythm: Normal rate and regular rhythm.  Pulmonary:     Effort: Pulmonary effort is normal.     Breath sounds: Normal breath sounds.  Musculoskeletal:     Right lower leg: No edema.     Left lower leg: No edema.  Neurological:     General: No focal deficit present.     Mental Status: She is alert.  Psychiatric:     Comments: anxious       The 10-year ASCVD risk score (Arnett DK, et al., 2019) is: 7.9%    Assessment & Plan:  [name] was seen today for medical management of chronic  issues.  Diagnoses and all orders for this visit:  Adjustment disorder with mixed anxiety and depressed mood  GAD (generalized anxiety disorder)  Stress at work  Uncontrolled type 2 diabetes mellitus with hypoglycemia without coma (HCC) -     tirzepatide  (MOUNJARO ) 12.5 MG/0.5ML Pen; Inject 12.5 mg into the skin once a week.   Assessment & Plan Work-related stress with adjustment disorder and generalized anxiety Experiencing significant work-related stress due to recent demotion and perceived gaslighting by a manager, leading to increased anxiety and depressive symptoms. Considering a mutual separation agreement and exploring options for medical leave. HR is aware and involved. PHQ/GAD not to goal.  - Continue Zoloft  100 mg daily. - Continue xanax  as needed - Completed ADA paperwork for work-from-home accommodations. - Faxed and provided a copy of the completed paperwork to her.  T2DM, uncontrolled - increased mounjaro  to 12.5mg   - A1C due on one month   Follow up in one month when follow up for A1C is scheduled.    Harshita Bernales, PA-C  "

## 2024-10-13 ENCOUNTER — Encounter: Payer: Self-pay | Admitting: Physician Assistant

## 2024-10-14 ENCOUNTER — Telehealth: Payer: Self-pay

## 2024-10-14 DIAGNOSIS — F902 Attention-deficit hyperactivity disorder, combined type: Secondary | ICD-10-CM | POA: Diagnosis not present

## 2024-10-14 NOTE — Telephone Encounter (Signed)
 Attempted to contact patient in regards to scheduling a follow-up appointment with Jade Breeback. Unfortunately there was no answer but I was able to leave a detailed voice message, per DPR, requesting a call back.

## 2024-10-20 DIAGNOSIS — F902 Attention-deficit hyperactivity disorder, combined type: Secondary | ICD-10-CM | POA: Diagnosis not present

## 2024-10-26 DIAGNOSIS — F902 Attention-deficit hyperactivity disorder, combined type: Secondary | ICD-10-CM | POA: Diagnosis not present

## 2024-11-03 DIAGNOSIS — F902 Attention-deficit hyperactivity disorder, combined type: Secondary | ICD-10-CM | POA: Diagnosis not present

## 2024-11-10 DIAGNOSIS — F902 Attention-deficit hyperactivity disorder, combined type: Secondary | ICD-10-CM | POA: Diagnosis not present

## 2024-11-12 ENCOUNTER — Other Ambulatory Visit (HOSPITAL_BASED_OUTPATIENT_CLINIC_OR_DEPARTMENT_OTHER): Payer: Self-pay

## 2024-11-12 ENCOUNTER — Encounter: Payer: Self-pay | Admitting: Physician Assistant

## 2024-11-12 ENCOUNTER — Telehealth: Admitting: Physician Assistant

## 2024-11-12 DIAGNOSIS — J069 Acute upper respiratory infection, unspecified: Secondary | ICD-10-CM | POA: Diagnosis not present

## 2024-11-12 DIAGNOSIS — B9689 Other specified bacterial agents as the cause of diseases classified elsewhere: Secondary | ICD-10-CM | POA: Diagnosis not present

## 2024-11-12 DIAGNOSIS — J208 Acute bronchitis due to other specified organisms: Secondary | ICD-10-CM

## 2024-11-12 MED ORDER — AIRSUPRA 90-80 MCG/ACT IN AERO: 2.0000 | INHALATION_SPRAY | Freq: Four times a day (QID) | RESPIRATORY_TRACT | 11 refills | Status: AC | PRN

## 2024-11-12 MED ORDER — HYDROCOD POLI-CHLORPHE POLI ER 10-8 MG/5ML PO SUER: 5.0000 mL | Freq: Every evening | ORAL | 0 refills | Status: AC | PRN

## 2024-11-12 MED ORDER — AZITHROMYCIN 250 MG PO TABS
ORAL_TABLET | ORAL | 0 refills | Status: AC
  Filled 2024-11-12: qty 6, 5d supply, fill #0

## 2024-11-12 MED ORDER — PROMETHAZINE-DM 6.25-15 MG/5ML PO SYRP
5.0000 mL | ORAL_SOLUTION | Freq: Four times a day (QID) | ORAL | 0 refills | Status: AC | PRN
  Filled 2024-11-12: qty 118, 6d supply, fill #0

## 2024-11-12 MED ORDER — BENZONATATE 100 MG PO CAPS
100.0000 mg | ORAL_CAPSULE | Freq: Three times a day (TID) | ORAL | 0 refills | Status: AC | PRN
  Filled 2024-11-12: qty 30, 5d supply, fill #0

## 2024-11-12 NOTE — Telephone Encounter (Signed)

## 2024-11-12 NOTE — Progress Notes (Signed)
 Virtual Visit Consent   Brittany Byrd, you are scheduled for a virtual visit with a [facility] provider today. Just as with appointments in the office, your consent must be obtained to participate. Your consent will be active for this visit and any virtual visit you may have with one of our providers in the next 365 days. If you have a MyChart account, a copy of this consent can be sent to you electronically.  As this is a virtual visit, video technology does not allow for your provider to perform a traditional examination. This may limit your provider's ability to fully assess your condition. If your provider identifies any concerns that need to be evaluated in person or the need to arrange testing (such as labs, EKG, etc.), we will make arrangements to do so. Although advances in technology are sophisticated, we cannot ensure that it will always work on either your end or our end. If the connection with a video visit is poor, the visit may have to be switched to a telephone visit. With either a video or telephone visit, we are not always able to ensure that we have a secure connection.  By engaging in this virtual visit, you consent to the provision of healthcare and authorize for your insurance to be billed (if applicable) for the services provided during this visit. Depending on your insurance coverage, you may receive a charge related to this service.  I need to obtain your verbal consent now. Are you willing to proceed with your visit today? Brittany Byrd has provided verbal consent on 11/12/2024 for a virtual visit (video or telephone). Brittany [name], PA-C  Date: 11/12/2024 11:36 AM   Virtual Visit via Video Note   IDelon CHRISTELLA Byrd, connected with  Brittany Byrd  ([id number], 04/22/1971) on 11/12/2024 at 10:45 AM EST by a video-enabled telemedicine application and verified that I am speaking with the correct person using two identifiers.  Location: Patient: Virtual Visit Location  Patient: Home Provider: Virtual Visit Location Provider: Home Office   I discussed the limitations of evaluation and management by telemedicine and the availability of in person appointments. The patient expressed understanding and agreed to proceed.    History of Present Illness: Brittany Byrd is a 53 y.o. who identifies as a female who was assigned female at birth, and is being seen today for cough and congestion.  HPI: URI  This is a new problem. The current episode started in the past 7 days (improved yesterday then worsened overnight). The problem has been rapidly worsening. Maximum temperature: chills. Associated symptoms include congestion, coughing (dry, harsh, barking), diarrhea (just more than normal), headaches, a plugged ear sensation, rhinorrhea, sinus pain, sneezing and a sore throat. Pertinent negatives include no ear pain, nausea, vomiting or wheezing. Treatments tried: ricola cough drops, mucinex fast max. The treatment provided no relief.    Problems:  Patient Active Problem List   Diagnosis Date Noted   Stress at work 07/21/2024   Adjustment disorder with mixed disturbance of emotions and conduct 07/21/2024   ADHD (attention deficit hyperactivity disorder), combined type 06/22/2024   Irritable bowel syndrome with diarrhea 06/22/2024   Intertrigo 04/09/2024   Grief 04/09/2024   Essential hypertension with goal blood pressure less than 130/80 01/02/2024   Hyperlipidemia LDL goal <70 01/02/2024   Asymptomatic microscopic hematuria 01/02/2024   Uncontrolled type 2 diabetes mellitus with hypoglycemia (HCC) 12/16/2023   Abnormal urine odor 12/09/2023   Other microscopic hematuria 12/05/2023  Acute cystitis with hematuria 08/07/2023   Antibiotic-induced yeast infection 08/07/2023   Irritable bowel syndrome with constipation 06/04/2023   Post-menopausal 06/04/2023   Class 2 severe obesity due to excess calories with serious comorbidity and body mass index (BMI) of 38.0 to  38.9 in adult 02/25/2023   Primary osteoarthritis of right knee 02/25/2023   Panic attacks 12/13/2022   GAD (generalized anxiety disorder) 12/13/2022   Hyperlipidemia 10/30/2021   Elevated fasting glucose 10/30/2021   Recurrent major depressive disorder 12/15/2020   Elevated LDL cholesterol level 10/05/2019   Left flank pain 10/05/2019   Left lower quadrant guarding 10/05/2019   Ketonuria 10/04/2019   Isolated proteinuria with morphologic lesion 10/04/2019   Bilirubinuria 10/04/2019   Uterine fibroid 01/05/2019   Ovarian cyst 01/05/2019   BMI 35.0-35.9,adult 04/20/2017   Heart murmur 04/20/2017   Constipation 04/18/2017   PMDD (premenstrual dysphoric disorder) 01/21/2017   Neuropathy 08/28/2016   Tinnitus of right ear 08/28/2016   Class 2 obesity due to excess calories without serious comorbidity with body mass index (BMI) of 39.0 to 39.9 in adult 03/11/2015   Epicondylitis elbow, medial 11/04/2014   Patellofemoral syndrome 11/04/2014   Asthma, allergic 11/18/2012   Hypertension    Seasonal allergies    Migraine     Allergies: Allergies[1] Medications: Current Medications[2]  Observations/Objective: Patient is well-developed, well-nourished in no acute distress.  Resting comfortably at home.  Head is normocephalic, atraumatic.  No labored breathing.  Speech is clear and coherent with logical content.  Patient is alert and oriented at baseline.  Dry, barking cough heard often not affecting speech or activities  Assessment and Plan: 1. Acute bacterial bronchitis (Primary) - azithromycin  (ZITHROMAX ) 250 MG tablet; Take 2 tablets on day 1, then 1 tablet daily on days 2 through 5  Dispense: 6 tablet; Refill: 0 - promethazine -dextromethorphan (PROMETHAZINE -DM) 6.25-15 MG/5ML syrup; Take 5 mLs by mouth 4 (four) times daily as needed.  Dispense: 118 mL; Refill: 0 - benzonatate  (TESSALON ) 100 MG capsule; Take 1-2 capsules (100-200 mg total) by mouth 3 (three) times daily as  needed.  Dispense: 30 capsule; Refill: 0  - Worsening over a week despite OTC medications - Will treat with Z-pack, Promethazine  DM (nighttime) and tessalon  perles - Can continue Mucinex during the day - Push fluids.  - Rest.  - Steam and humidifier can help - Seek in person evaluation if worsening or symptoms fail to improve    Follow Up Instructions: I discussed the assessment and treatment plan with the patient. The patient was provided an opportunity to ask questions and all were answered. The patient agreed with the plan and demonstrated an understanding of the instructions.  A copy of instructions were sent to the patient via MyChart unless otherwise noted below.    The patient was advised to call back or seek an in-person evaluation if the symptoms worsen or if the condition fails to improve as anticipated.    Brittany [name], PA-C      [1]  Allergies Allergen Reactions   Prednisone  Other (See Comments)    can't sleep, it will keep me up all night but I can tolerate the shot form   Singulair  [Montelukast  Sodium]     depressed  [2]  Current Outpatient Medications:    azithromycin  (ZITHROMAX ) 250 MG tablet, Take 2 tablets on day 1, then 1 tablet daily on days 2 through 5, Disp: 6 tablet, Rfl: 0   benzonatate  (TESSALON ) 100 MG capsule, Take 1-2 capsules (100-200 mg total)  by mouth 3 (three) times daily as needed., Disp: 30 capsule, Rfl: 0   promethazine -dextromethorphan (PROMETHAZINE -DM) 6.25-15 MG/5ML syrup, Take 5 mLs by mouth 4 (four) times daily as needed., Disp: 118 mL, Rfl: 0   ALPRAZolam  (XANAX ) 0.5 MG tablet, Take 1 tablet (0.5 mg total) by mouth 2 (two) times daily as needed for anxiety. And panic attacks, Disp: 20 tablet, Rfl: 0   amphetamine -dextroamphetamine  (ADDERALL XR) 10 MG 24 hr capsule, Take 1 capsule (10 mg total) by mouth daily., Disp: 90 capsule, Rfl: 0   diclofenac  (VOLTAREN ) 75 MG EC tablet, Take 1 tablet (75 mg total) by mouth 2 (two) times  daily., Disp: 180 tablet, Rfl: 1   dicyclomine  (BENTYL ) 20 MG tablet, Take 1 tablet (20 mg total) by mouth 3 (three) times daily before meals., Disp: 90 tablet, Rfl: 2   hydrochlorothiazide  (HYDRODIURIL ) 12.5 MG tablet, Take 1 tablet (12.5 mg total) by mouth daily., Disp: 90 tablet, Rfl: 1   ketoconazole  (NIZORAL ) 2 % cream, Apply 1 Application topically daily., Disp: 60 g, Rfl: 2   losartan  (COZAAR ) 100 MG tablet, Take 1 tablet (100 mg total) by mouth daily., Disp: 90 tablet, Rfl: 1   nystatin  ointment (MYCOSTATIN ), Apply 1 Application topically 2 (two) times daily., Disp: 30 g, Rfl: 0   ondansetron  (ZOFRAN -ODT) 8 MG disintegrating tablet, Take 1 tablet (8 mg total) by mouth every 8 (eight) hours as needed., Disp: 20 tablet, Rfl: 1   sertraline  (ZOLOFT ) 100 MG tablet, Take 1 tablet (100 mg total) by mouth daily., Disp: 90 tablet, Rfl: 0   tirzepatide  (MOUNJARO ) 12.5 MG/0.5ML Pen, Inject 12.5 mg into the skin once a week., Disp: 6 mL, Rfl: 0

## 2024-11-12 NOTE — Patient Instructions (Signed)
 Brittany Byrd, thank you for joining Delon [name], PA-C for today's virtual visit.  While this provider is not your primary care provider (PCP), if your PCP is located in our provider database this encounter information will be shared with them immediately following your visit.   A [facility] MyChart account gives you access to today's visit and all your visits, tests, and labs performed at [facility]  click here if you don't have a [facility] MyChart account or go to [email or web address]  Consent: (Patient) Brittany Byrd provided verbal consent for this virtual visit at the beginning of the encounter.  Current Medications:  Current Outpatient Medications:    azithromycin  (ZITHROMAX ) 250 MG tablet, Take 2 tablets on day 1, then 1 tablet daily on days 2 through 5, Disp: 6 tablet, Rfl: 0   benzonatate  (TESSALON ) 100 MG capsule, Take 1-2 capsules (100-200 mg total) by mouth 3 (three) times daily as needed., Disp: 30 capsule, Rfl: 0   promethazine -dextromethorphan (PROMETHAZINE -DM) 6.25-15 MG/5ML syrup, Take 5 mLs by mouth 4 (four) times daily as needed., Disp: 118 mL, Rfl: 0   ALPRAZolam  (XANAX ) 0.5 MG tablet, Take 1 tablet (0.5 mg total) by mouth 2 (two) times daily as needed for anxiety. And panic attacks, Disp: 20 tablet, Rfl: 0   amphetamine -dextroamphetamine  (ADDERALL XR) 10 MG 24 hr capsule, Take 1 capsule (10 mg total) by mouth daily., Disp: 90 capsule, Rfl: 0   diclofenac  (VOLTAREN ) 75 MG EC tablet, Take 1 tablet (75 mg total) by mouth 2 (two) times daily., Disp: 180 tablet, Rfl: 1   dicyclomine  (BENTYL ) 20 MG tablet, Take 1 tablet (20 mg total) by mouth 3 (three) times daily before meals., Disp: 90 tablet, Rfl: 2   hydrochlorothiazide  (HYDRODIURIL ) 12.5 MG tablet, Take 1 tablet (12.5 mg total) by mouth daily., Disp: 90 tablet, Rfl: 1   ketoconazole  (NIZORAL ) 2 % cream, Apply 1 Application topically daily., Disp: 60 g, Rfl: 2   losartan  (COZAAR ) 100 MG tablet, Take  1 tablet (100 mg total) by mouth daily., Disp: 90 tablet, Rfl: 1   nystatin  ointment (MYCOSTATIN ), Apply 1 Application topically 2 (two) times daily., Disp: 30 g, Rfl: 0   ondansetron  (ZOFRAN -ODT) 8 MG disintegrating tablet, Take 1 tablet (8 mg total) by mouth every 8 (eight) hours as needed., Disp: 20 tablet, Rfl: 1   sertraline  (ZOLOFT ) 100 MG tablet, Take 1 tablet (100 mg total) by mouth daily., Disp: 90 tablet, Rfl: 0   tirzepatide  (MOUNJARO ) 12.5 MG/0.5ML Pen, Inject 12.5 mg into the skin once a week., Disp: 6 mL, Rfl: 0   Medications ordered in this encounter:  Meds ordered this encounter  Medications   azithromycin  (ZITHROMAX ) 250 MG tablet    Sig: Take 2 tablets on day 1, then 1 tablet daily on days 2 through 5    Dispense:  6 tablet    Refill:  0    Supervising Provider:   LAMPTEY, PHILIP O [[id number]]   promethazine -dextromethorphan (PROMETHAZINE -DM) 6.25-15 MG/5ML syrup    Sig: Take 5 mLs by mouth 4 (four) times daily as needed.    Dispense:  118 mL    Refill:  0    Supervising Provider:   LAMPTEY, PHILIP O [[id number]]   benzonatate  (TESSALON ) 100 MG capsule    Sig: Take 1-2 capsules (100-200 mg total) by mouth 3 (three) times daily as needed.    Dispense:  30 capsule    Refill:  0    Supervising Provider:   BLAISE, PHILIP  MALVA [[id number]]     *If you need refills on other medications prior to your next appointment, please contact your pharmacy*  Follow-Up: Call back or seek an in-person evaluation if the symptoms worsen or if the condition fails to improve as anticipated.  [facility] [phone]  Other Instructions Acute Bronchitis, Adult  Acute bronchitis is sudden inflammation of the main airways (bronchi) that come off the windpipe (trachea) in the lungs. The swelling causes the airways to get smaller and make more mucus than normal. This can make it hard to breathe and can cause coughing or noisy breathing (wheezing). Acute bronchitis may last several  weeks. The cough may last longer. Allergies, asthma, and exposure to smoke may make the condition worse. What are the causes? This condition can be caused by germs and by substances that irritate the lungs, including: Cold and flu viruses. The most common cause of this condition is the virus that causes the common cold. Bacteria. This is less common. Breathing in substances that irritate the lungs, including: Smoke from cigarettes and other forms of tobacco. Dust and pollen. Fumes from household cleaning products, gases, or burned fuel. Indoor or outdoor air pollution. What increases the risk? The following factors may make you more likely to develop this condition: A weak body's defense system, also called the immune system. A condition that affects your lungs and breathing, such as asthma. What are the signs or symptoms? Common symptoms of this condition include: Coughing. This may bring up clear, yellow, or green mucus from your lungs (sputum). Wheezing. Runny or stuffy nose. Having too much mucus in your lungs (chest congestion). Shortness of breath. Aches and pains, including sore throat or chest. How is this diagnosed? This condition is usually diagnosed based on: Your symptoms and medical history. A physical exam. You may also have other tests, including tests to rule out other conditions, such as pneumonia. These tests include: A test of lung function. Test of a mucus sample to look for the presence of bacteria. Tests to check the oxygen level in your blood. Blood tests. Chest X-ray. How is this treated? Most cases of acute bronchitis clear up over time without treatment. Your health care provider may recommend: Drinking more fluids to help thin your mucus so it is easier to cough up. Taking inhaled medicine (inhaler) to improve air flow in and out of your lungs. Using a vaporizer or a humidifier. These are machines that add water to the air to help you breathe  better. Taking a medicine that thins mucus and clears congestion (expectorant). Taking a medicine that prevents or stops coughing (cough suppressant). It is not common to take an antibiotic medicine for this condition. Follow these instructions at home:  Take over-the-counter and prescription medicines only as told by your health care provider. Use an inhaler, vaporizer, or humidifier as told by your health care provider. Take two teaspoons (10 mL) of honey at bedtime to lessen coughing at night. Drink enough fluid to keep your urine pale yellow. Do not use any products that contain nicotine or tobacco. These products include cigarettes, chewing tobacco, and vaping devices, such as e-cigarettes. If you need help quitting, ask your health care provider. Get plenty of rest. Return to your normal activities as told by your health care provider. Ask your health care provider what activities are safe for you. Keep all follow-up visits. This is important. How is this prevented? To lower your risk of getting this condition again: [place] your  hands often with soap and water for at least 20 seconds. If soap and water are not available, use hand sanitizer. Avoid contact with people who have cold symptoms. Try not to touch your mouth, nose, or eyes with your hands. Avoid breathing in smoke or chemical fumes. Breathing smoke or chemical fumes will make your condition worse. Get the flu shot every year. Contact a health care provider if: Your symptoms do not improve after 2 weeks. You have trouble coughing up the mucus. Your cough keeps you awake at night. You have a fever. Get help right away if you: Cough up blood. Feel pain in your chest. Have severe shortness of breath. Faint or keep feeling like you are going to faint. Have a severe headache. Have a fever or chills that get worse. These symptoms may represent a serious problem that is an emergency. Do not wait to see if the symptoms will go  away. Get medical help right away. Call your local emergency services (911 in the [place] Do not drive yourself to the hospital. Summary Acute bronchitis is inflammation of the main airways (bronchi) that come off the windpipe (trachea) in the lungs. The swelling causes the airways to get smaller and make more mucus than normal. Drinking more fluids can help thin your mucus so it is easier to cough up. Take over-the-counter and prescription medicines only as told by your health care provider. Do not use any products that contain nicotine or tobacco. These products include cigarettes, chewing tobacco, and vaping devices, such as e-cigarettes. If you need help quitting, ask your health care provider. Contact a health care provider if your symptoms do not improve after 2 weeks. This information is not intended to replace advice given to you by your health care provider. Make sure you discuss any questions you have with your health care provider. Document Revised: 02/28/2022 Document Reviewed: 03/21/2021 Elsevier Patient Education  2024 [employer]   If you have been instructed to have an in-person evaluation today at a local Urgent Care facility, please use the link below. It will take you to a list of all of our available [facility], including address, phone number and hours of operation. Please do not delay care.  [facility]  If you or a family member do not have a primary care provider, use the link below to schedule a visit and establish care. When you choose a [facility] primary care physician or advanced practice provider, you gain a long-term partner in health. Find a Primary Care Provider  Learn more about [facility] in-office and virtual care options: [facility] Get Care Now

## 2024-11-12 NOTE — Addendum Note (Signed)
 Addended by: Gleb Mcguire on: 11/12/2024 12:09 PM   Modules accepted: Orders

## 2024-11-23 DIAGNOSIS — F902 Attention-deficit hyperactivity disorder, combined type: Secondary | ICD-10-CM | POA: Diagnosis not present

## 2024-11-30 DIAGNOSIS — F902 Attention-deficit hyperactivity disorder, combined type: Secondary | ICD-10-CM | POA: Diagnosis not present
